# Patient Record
Sex: Female | Born: 1955 | Race: Black or African American | Hispanic: No | Marital: Married | Smoking: Never smoker
Health system: Southern US, Community
[De-identification: ages and names within clinical notes are randomized; demographics above are authoritative.]

## PROBLEM LIST (undated history)

## (undated) DIAGNOSIS — R3989 Other symptoms and signs involving the genitourinary system: Secondary | ICD-10-CM

## (undated) DIAGNOSIS — M542 Cervicalgia: Secondary | ICD-10-CM

## (undated) DIAGNOSIS — R351 Nocturia: Secondary | ICD-10-CM

## (undated) DIAGNOSIS — R0602 Shortness of breath: Secondary | ICD-10-CM

## (undated) DIAGNOSIS — M1990 Unspecified osteoarthritis, unspecified site: Secondary | ICD-10-CM

## (undated) DIAGNOSIS — T7840XA Allergy, unspecified, initial encounter: Secondary | ICD-10-CM

## (undated) DIAGNOSIS — K219 Gastro-esophageal reflux disease without esophagitis: Secondary | ICD-10-CM

## (undated) DIAGNOSIS — M810 Age-related osteoporosis without current pathological fracture: Secondary | ICD-10-CM

## (undated) DIAGNOSIS — Z8719 Personal history of other diseases of the digestive system: Secondary | ICD-10-CM

## (undated) DIAGNOSIS — R350 Frequency of micturition: Secondary | ICD-10-CM

## (undated) DIAGNOSIS — R3915 Urgency of urination: Secondary | ICD-10-CM

## (undated) DIAGNOSIS — E785 Hyperlipidemia, unspecified: Secondary | ICD-10-CM

## (undated) DIAGNOSIS — G4730 Sleep apnea, unspecified: Secondary | ICD-10-CM

## (undated) DIAGNOSIS — N3010 Interstitial cystitis (chronic) without hematuria: Secondary | ICD-10-CM

## (undated) DIAGNOSIS — G8929 Other chronic pain: Secondary | ICD-10-CM

## (undated) DIAGNOSIS — J45909 Unspecified asthma, uncomplicated: Secondary | ICD-10-CM

## (undated) HISTORY — DX: Allergy, unspecified, initial encounter: T78.40XA

## (undated) HISTORY — PX: OTHER SURGICAL HISTORY: SHX169

## (undated) HISTORY — DX: Hyperlipidemia, unspecified: E78.5

## (undated) HISTORY — PX: ABDOMINAL HYSTERECTOMY: SHX81

## (undated) HISTORY — DX: Age-related osteoporosis without current pathological fracture: M81.0

## (undated) HISTORY — DX: Unspecified osteoarthritis, unspecified site: M19.90

## (undated) HISTORY — DX: Sleep apnea, unspecified: G47.30

## (undated) HISTORY — PX: APPENDECTOMY: SHX54

## (undated) HISTORY — DX: Essential (primary) hypertension: I10

---

## 1996-02-01 HISTORY — PX: CHOLECYSTECTOMY: SHX55

## 1999-12-02 ENCOUNTER — Encounter (INDEPENDENT_AMBULATORY_CARE_PROVIDER_SITE_OTHER): Payer: Self-pay | Admitting: Specialist

## 1999-12-02 ENCOUNTER — Ambulatory Visit (HOSPITAL_COMMUNITY): Admission: RE | Admit: 1999-12-02 | Discharge: 1999-12-02 | Payer: Self-pay | Admitting: Urology

## 2000-07-30 ENCOUNTER — Ambulatory Visit (HOSPITAL_COMMUNITY): Admission: RE | Admit: 2000-07-30 | Discharge: 2000-07-30 | Payer: Self-pay | Admitting: Family Medicine

## 2000-07-30 ENCOUNTER — Encounter: Payer: Self-pay | Admitting: Family Medicine

## 2000-08-07 ENCOUNTER — Encounter: Payer: Self-pay | Admitting: Family Medicine

## 2000-08-07 ENCOUNTER — Ambulatory Visit (HOSPITAL_COMMUNITY): Admission: RE | Admit: 2000-08-07 | Discharge: 2000-08-07 | Payer: Self-pay | Admitting: Family Medicine

## 2000-11-20 ENCOUNTER — Encounter: Admission: RE | Admit: 2000-11-20 | Discharge: 2000-11-20 | Payer: Self-pay | Admitting: Urology

## 2000-11-20 ENCOUNTER — Encounter: Payer: Self-pay | Admitting: Urology

## 2000-11-27 ENCOUNTER — Other Ambulatory Visit: Admission: RE | Admit: 2000-11-27 | Discharge: 2000-11-27 | Payer: Self-pay | Admitting: Family Medicine

## 2000-12-07 ENCOUNTER — Encounter: Payer: Self-pay | Admitting: Family Medicine

## 2000-12-07 ENCOUNTER — Ambulatory Visit (HOSPITAL_COMMUNITY): Admission: RE | Admit: 2000-12-07 | Discharge: 2000-12-07 | Payer: Self-pay | Admitting: Family Medicine

## 2001-05-07 ENCOUNTER — Ambulatory Visit (HOSPITAL_COMMUNITY): Admission: RE | Admit: 2001-05-07 | Discharge: 2001-05-07 | Payer: Self-pay | Admitting: Family Medicine

## 2001-05-07 ENCOUNTER — Encounter: Payer: Self-pay | Admitting: Family Medicine

## 2002-05-09 ENCOUNTER — Encounter: Payer: Self-pay | Admitting: Podiatry

## 2002-05-13 ENCOUNTER — Ambulatory Visit (HOSPITAL_COMMUNITY): Admission: RE | Admit: 2002-05-13 | Discharge: 2002-05-13 | Payer: Self-pay | Admitting: Podiatry

## 2002-06-14 ENCOUNTER — Encounter: Payer: Self-pay | Admitting: Family Medicine

## 2002-06-14 ENCOUNTER — Ambulatory Visit (HOSPITAL_COMMUNITY): Admission: RE | Admit: 2002-06-14 | Discharge: 2002-06-14 | Payer: Self-pay | Admitting: Family Medicine

## 2003-03-04 ENCOUNTER — Ambulatory Visit (HOSPITAL_COMMUNITY): Admission: RE | Admit: 2003-03-04 | Discharge: 2003-03-04 | Payer: Self-pay | Admitting: Family Medicine

## 2003-04-14 ENCOUNTER — Ambulatory Visit (HOSPITAL_COMMUNITY): Admission: RE | Admit: 2003-04-14 | Discharge: 2003-04-14 | Payer: Self-pay | Admitting: Podiatry

## 2003-04-14 HISTORY — PX: BUNIONECTOMY: SHX129

## 2003-06-30 ENCOUNTER — Ambulatory Visit (HOSPITAL_COMMUNITY): Admission: RE | Admit: 2003-06-30 | Discharge: 2003-06-30 | Payer: Self-pay | Admitting: Family Medicine

## 2004-01-19 ENCOUNTER — Ambulatory Visit: Payer: Self-pay | Admitting: Orthopedic Surgery

## 2004-03-12 ENCOUNTER — Ambulatory Visit (HOSPITAL_COMMUNITY): Admission: RE | Admit: 2004-03-12 | Discharge: 2004-03-12 | Payer: Self-pay | Admitting: Orthopedic Surgery

## 2004-03-31 ENCOUNTER — Encounter (HOSPITAL_COMMUNITY): Admission: RE | Admit: 2004-03-31 | Discharge: 2004-04-30 | Payer: Self-pay | Admitting: Orthopedic Surgery

## 2004-05-03 ENCOUNTER — Ambulatory Visit (HOSPITAL_COMMUNITY): Admission: RE | Admit: 2004-05-03 | Discharge: 2004-05-03 | Payer: Self-pay | Admitting: Podiatry

## 2004-05-03 HISTORY — PX: FOOT ARTHROPLASTY: SHX1657

## 2004-05-10 ENCOUNTER — Ambulatory Visit (HOSPITAL_COMMUNITY): Admission: RE | Admit: 2004-05-10 | Discharge: 2004-05-10 | Payer: Self-pay | Admitting: Orthopedic Surgery

## 2004-05-28 ENCOUNTER — Ambulatory Visit: Payer: Self-pay | Admitting: Family Medicine

## 2004-05-31 ENCOUNTER — Ambulatory Visit: Payer: Self-pay | Admitting: Family Medicine

## 2004-08-05 ENCOUNTER — Ambulatory Visit (HOSPITAL_COMMUNITY): Admission: RE | Admit: 2004-08-05 | Discharge: 2004-08-05 | Payer: Self-pay | Admitting: Family Medicine

## 2004-08-12 ENCOUNTER — Encounter: Admission: RE | Admit: 2004-08-12 | Discharge: 2004-08-12 | Payer: Self-pay | Admitting: Family Medicine

## 2004-09-24 ENCOUNTER — Ambulatory Visit: Payer: Self-pay | Admitting: Family Medicine

## 2004-10-11 ENCOUNTER — Ambulatory Visit (HOSPITAL_COMMUNITY): Admission: RE | Admit: 2004-10-11 | Discharge: 2004-10-11 | Payer: Self-pay | Admitting: Family Medicine

## 2004-10-13 ENCOUNTER — Ambulatory Visit (HOSPITAL_COMMUNITY): Admission: RE | Admit: 2004-10-13 | Discharge: 2004-10-13 | Payer: Self-pay | Admitting: *Deleted

## 2004-10-26 ENCOUNTER — Encounter (HOSPITAL_COMMUNITY): Admission: RE | Admit: 2004-10-26 | Discharge: 2004-10-26 | Payer: Self-pay | Admitting: *Deleted

## 2005-02-01 ENCOUNTER — Ambulatory Visit (HOSPITAL_COMMUNITY): Admission: RE | Admit: 2005-02-01 | Discharge: 2005-02-01 | Payer: Self-pay | Admitting: Family Medicine

## 2005-03-24 ENCOUNTER — Ambulatory Visit: Payer: Self-pay | Admitting: Pulmonary Disease

## 2005-03-28 ENCOUNTER — Ambulatory Visit: Payer: Self-pay | Admitting: Internal Medicine

## 2005-04-04 ENCOUNTER — Ambulatory Visit (HOSPITAL_COMMUNITY): Admission: RE | Admit: 2005-04-04 | Discharge: 2005-04-04 | Payer: Self-pay | Admitting: Internal Medicine

## 2005-04-04 ENCOUNTER — Ambulatory Visit: Payer: Self-pay | Admitting: Internal Medicine

## 2005-04-04 ENCOUNTER — Encounter (INDEPENDENT_AMBULATORY_CARE_PROVIDER_SITE_OTHER): Payer: Self-pay | Admitting: Internal Medicine

## 2005-04-19 ENCOUNTER — Ambulatory Visit: Payer: Self-pay | Admitting: Internal Medicine

## 2005-08-12 ENCOUNTER — Encounter: Admission: RE | Admit: 2005-08-12 | Discharge: 2005-08-12 | Payer: Self-pay | Admitting: Family Medicine

## 2005-08-29 ENCOUNTER — Other Ambulatory Visit: Admission: RE | Admit: 2005-08-29 | Discharge: 2005-08-29 | Payer: Self-pay | Admitting: Family Medicine

## 2005-08-29 ENCOUNTER — Ambulatory Visit: Payer: Self-pay | Admitting: Family Medicine

## 2005-08-29 ENCOUNTER — Encounter: Payer: Self-pay | Admitting: Family Medicine

## 2005-12-05 ENCOUNTER — Ambulatory Visit: Payer: Self-pay | Admitting: Family Medicine

## 2006-03-17 ENCOUNTER — Ambulatory Visit: Payer: Self-pay | Admitting: Family Medicine

## 2006-03-31 ENCOUNTER — Encounter: Payer: Self-pay | Admitting: Family Medicine

## 2006-03-31 LAB — CONVERTED CEMR LAB
ALT: 15 units/L (ref 0–35)
Albumin: 4.5 g/dL (ref 3.5–5.2)
Bilirubin, Direct: 0.1 mg/dL (ref 0.0–0.3)
Cholesterol: 174 mg/dL (ref 0–200)
Eosinophils Relative: 1 % (ref 0–5)
HCT: 39.4 % (ref 36.0–46.0)
HDL: 58 mg/dL (ref >39)
Hemoglobin: 12.1 g/dL (ref 12.0–15.0)
Lymphocytes Relative: 48 % — ABNORMAL HIGH (ref 12–46)
Lymphs Abs: 2.7 10*3/uL (ref 0.7–3.3)
Monocytes Absolute: 0.4 10*3/uL (ref 0.2–0.7)
Total Bilirubin: 0.4 mg/dL (ref 0.3–1.2)
Total CHOL/HDL Ratio: 3
VLDL: 7 mg/dL (ref 0–40)
WBC: 5.6 10*3/uL (ref 4.0–10.5)

## 2006-04-18 ENCOUNTER — Ambulatory Visit: Payer: Self-pay | Admitting: Family Medicine

## 2006-05-03 ENCOUNTER — Ambulatory Visit: Payer: Self-pay | Admitting: Gastroenterology

## 2006-05-08 ENCOUNTER — Ambulatory Visit (HOSPITAL_COMMUNITY): Admission: RE | Admit: 2006-05-08 | Discharge: 2006-05-08 | Payer: Self-pay | Admitting: Internal Medicine

## 2006-05-29 ENCOUNTER — Ambulatory Visit: Payer: Self-pay | Admitting: Family Medicine

## 2006-06-05 ENCOUNTER — Ambulatory Visit (HOSPITAL_COMMUNITY): Admission: RE | Admit: 2006-06-05 | Discharge: 2006-06-05 | Payer: Self-pay | Admitting: Family Medicine

## 2006-08-14 ENCOUNTER — Encounter: Admission: RE | Admit: 2006-08-14 | Discharge: 2006-08-14 | Payer: Self-pay | Admitting: Family Medicine

## 2006-08-30 ENCOUNTER — Encounter: Payer: Self-pay | Admitting: Family Medicine

## 2006-08-30 ENCOUNTER — Ambulatory Visit: Payer: Self-pay | Admitting: Family Medicine

## 2006-08-30 ENCOUNTER — Other Ambulatory Visit: Admission: RE | Admit: 2006-08-30 | Discharge: 2006-08-30 | Payer: Self-pay | Admitting: Family Medicine

## 2006-08-30 LAB — CONVERTED CEMR LAB: Pap Smear: NORMAL

## 2006-09-12 ENCOUNTER — Encounter: Payer: Self-pay | Admitting: Family Medicine

## 2006-09-12 LAB — CONVERTED CEMR LAB
BUN: 13 mg/dL (ref 6–23)
CO2: 25 meq/L (ref 19–32)
Chloride: 106 meq/L (ref 96–112)
Eosinophils Absolute: 0.1 10*3/uL (ref 0.0–0.7)
Glucose, Bld: 87 mg/dL (ref 70–99)
LDL Cholesterol: 107 mg/dL — ABNORMAL HIGH (ref 0–99)
Lymphocytes Relative: 48 % — ABNORMAL HIGH (ref 12–46)
Lymphs Abs: 3.3 10*3/uL (ref 0.7–3.3)
MCV: 87 fL (ref 78.0–100.0)
Monocytes Relative: 8 % (ref 3–11)
Neutrophils Relative %: 43 % (ref 43–77)
Potassium: 4.2 meq/L (ref 3.5–5.3)
RBC: 4.4 M/uL (ref 3.87–5.11)
TSH: 2.237 microintl units/mL (ref 0.350–5.50)
Total CHOL/HDL Ratio: 3.1
VLDL: 11 mg/dL (ref 0–40)
WBC: 6.8 10*3/uL (ref 4.0–10.5)

## 2006-09-18 ENCOUNTER — Ambulatory Visit: Payer: Self-pay | Admitting: Family Medicine

## 2006-09-18 LAB — CONVERTED CEMR LAB
Albumin: 4.3 g/dL (ref 3.5–5.2)
Bilirubin, Direct: <0.1 mg/dL (ref 0.0–0.3)
Total Bilirubin: 0.3 mg/dL (ref 0.3–1.2)

## 2007-01-06 ENCOUNTER — Encounter: Payer: Self-pay | Admitting: Pulmonary Disease

## 2007-03-29 ENCOUNTER — Encounter: Payer: Self-pay | Admitting: Family Medicine

## 2007-08-09 ENCOUNTER — Ambulatory Visit: Payer: Self-pay | Admitting: Family Medicine

## 2007-08-16 ENCOUNTER — Encounter: Payer: Self-pay | Admitting: Family Medicine

## 2007-08-16 DIAGNOSIS — E785 Hyperlipidemia, unspecified: Secondary | ICD-10-CM | POA: Insufficient documentation

## 2007-08-16 DIAGNOSIS — N3090 Cystitis, unspecified without hematuria: Secondary | ICD-10-CM | POA: Insufficient documentation

## 2007-08-21 ENCOUNTER — Encounter: Admission: RE | Admit: 2007-08-21 | Discharge: 2007-08-21 | Payer: Self-pay | Admitting: Family Medicine

## 2007-08-23 ENCOUNTER — Encounter: Payer: Self-pay | Admitting: Family Medicine

## 2007-08-23 LAB — CONVERTED CEMR LAB
Basophils Absolute: 0 10*3/uL (ref 0.0–0.1)
CO2: 24 meq/L (ref 19–32)
Calcium: 8.9 mg/dL (ref 8.4–10.5)
Chloride: 105 meq/L (ref 96–112)
Creatinine, Ser: 0.83 mg/dL (ref 0.40–1.20)
Eosinophils Relative: 2 % (ref 0–5)
Glucose, Bld: 91 mg/dL (ref 70–99)
HCT: 37.7 % (ref 36.0–46.0)
HDL: 53 mg/dL (ref >39)
Hemoglobin: 11.5 g/dL — ABNORMAL LOW (ref 12.0–15.0)
LDL Cholesterol: 129 mg/dL — ABNORMAL HIGH (ref 0–99)
Lymphocytes Relative: 52 % — ABNORMAL HIGH (ref 12–46)
Lymphs Abs: 3.1 10*3/uL (ref 0.7–4.0)
Monocytes Absolute: 0.5 10*3/uL (ref 0.1–1.0)
Neutro Abs: 2.3 10*3/uL (ref 1.7–7.7)
Sodium: 141 meq/L (ref 135–145)
TSH: 2.816 microintl units/mL (ref 0.350–4.50)
Total CHOL/HDL Ratio: 3.7
WBC: 6 10*3/uL (ref 4.0–10.5)

## 2007-09-13 ENCOUNTER — Ambulatory Visit: Payer: Self-pay | Admitting: Family Medicine

## 2007-09-13 ENCOUNTER — Encounter: Payer: Self-pay | Admitting: Family Medicine

## 2007-09-13 ENCOUNTER — Other Ambulatory Visit: Admission: RE | Admit: 2007-09-13 | Discharge: 2007-09-13 | Payer: Self-pay | Admitting: Family Medicine

## 2007-09-13 DIAGNOSIS — M25559 Pain in unspecified hip: Secondary | ICD-10-CM

## 2007-09-13 LAB — CONVERTED CEMR LAB: Retic Ct Pct: 1.1 % (ref 0.4–3.1)

## 2007-09-14 ENCOUNTER — Encounter: Payer: Self-pay | Admitting: Family Medicine

## 2007-09-14 LAB — CONVERTED CEMR LAB: Gardnerella vaginalis: NEGATIVE

## 2007-09-16 ENCOUNTER — Telehealth: Payer: Self-pay | Admitting: Family Medicine

## 2007-09-16 DIAGNOSIS — G4700 Insomnia, unspecified: Secondary | ICD-10-CM | POA: Insufficient documentation

## 2007-09-17 ENCOUNTER — Telehealth: Payer: Self-pay | Admitting: Family Medicine

## 2007-09-17 ENCOUNTER — Ambulatory Visit (HOSPITAL_COMMUNITY): Admission: RE | Admit: 2007-09-17 | Discharge: 2007-09-17 | Payer: Self-pay | Admitting: Family Medicine

## 2007-09-26 ENCOUNTER — Telehealth: Payer: Self-pay | Admitting: Family Medicine

## 2007-09-27 DIAGNOSIS — M549 Dorsalgia, unspecified: Secondary | ICD-10-CM | POA: Insufficient documentation

## 2007-10-09 ENCOUNTER — Ambulatory Visit (HOSPITAL_COMMUNITY): Admission: RE | Admit: 2007-10-09 | Discharge: 2007-10-09 | Payer: Self-pay | Admitting: Family Medicine

## 2007-10-14 ENCOUNTER — Telehealth: Payer: Self-pay | Admitting: Family Medicine

## 2007-10-22 DIAGNOSIS — R079 Chest pain, unspecified: Secondary | ICD-10-CM

## 2007-10-23 ENCOUNTER — Ambulatory Visit (HOSPITAL_COMMUNITY): Admission: RE | Admit: 2007-10-23 | Discharge: 2007-10-23 | Payer: Self-pay | Admitting: Family Medicine

## 2007-10-30 ENCOUNTER — Encounter: Payer: Self-pay | Admitting: Family Medicine

## 2007-10-30 ENCOUNTER — Telehealth: Payer: Self-pay | Admitting: Family Medicine

## 2007-11-29 ENCOUNTER — Ambulatory Visit: Payer: Self-pay | Admitting: Family Medicine

## 2007-11-29 ENCOUNTER — Ambulatory Visit (HOSPITAL_COMMUNITY): Admission: RE | Admit: 2007-11-29 | Discharge: 2007-11-29 | Payer: Self-pay | Admitting: Family Medicine

## 2007-11-29 DIAGNOSIS — J329 Chronic sinusitis, unspecified: Secondary | ICD-10-CM

## 2007-11-29 DIAGNOSIS — J209 Acute bronchitis, unspecified: Secondary | ICD-10-CM

## 2007-11-29 DIAGNOSIS — J042 Acute laryngotracheitis: Secondary | ICD-10-CM | POA: Insufficient documentation

## 2007-11-29 LAB — CONVERTED CEMR LAB
Basophils Relative: 1 % (ref 0–1)
Eosinophils Absolute: 0.1 10*3/uL (ref 0.0–0.7)
Eosinophils Relative: 2 % (ref 0–5)
HCT: 38.1 % (ref 36.0–46.0)
Lymphs Abs: 2.7 10*3/uL (ref 0.7–4.0)
MCHC: 31.8 g/dL (ref 30.0–36.0)
MCV: 84.3 fL (ref 78.0–100.0)
Monocytes Absolute: 0.5 10*3/uL (ref 0.1–1.0)
Monocytes Relative: 8 % (ref 3–12)
RBC: 4.52 M/uL (ref 3.87–5.11)
WBC: 5.7 10*3/uL (ref 4.0–10.5)

## 2007-12-06 ENCOUNTER — Telehealth: Payer: Self-pay | Admitting: Family Medicine

## 2008-01-11 ENCOUNTER — Ambulatory Visit: Payer: Self-pay | Admitting: Family Medicine

## 2008-01-11 DIAGNOSIS — J45991 Cough variant asthma: Secondary | ICD-10-CM

## 2008-01-11 DIAGNOSIS — Z9109 Other allergy status, other than to drugs and biological substances: Secondary | ICD-10-CM

## 2008-01-11 DIAGNOSIS — K219 Gastro-esophageal reflux disease without esophagitis: Secondary | ICD-10-CM | POA: Insufficient documentation

## 2008-01-11 LAB — CONVERTED CEMR LAB
Alkaline Phosphatase: 107 units/L (ref 39–117)
Bilirubin, Direct: 0.1 mg/dL (ref 0.0–0.3)
Indirect Bilirubin: 0.3 mg/dL (ref 0.0–0.9)
LDL Cholesterol: 108 mg/dL — ABNORMAL HIGH (ref 0–99)
Total Bilirubin: 0.4 mg/dL (ref 0.3–1.2)
Total Protein: 7.3 g/dL (ref 6.0–8.3)
VLDL: 8 mg/dL (ref 0–40)

## 2008-01-14 ENCOUNTER — Telehealth: Payer: Self-pay | Admitting: Family Medicine

## 2008-02-13 ENCOUNTER — Ambulatory Visit: Payer: Self-pay | Admitting: Gastroenterology

## 2008-03-10 ENCOUNTER — Ambulatory Visit: Payer: Self-pay | Admitting: Gastroenterology

## 2008-03-10 ENCOUNTER — Ambulatory Visit (HOSPITAL_COMMUNITY): Admission: RE | Admit: 2008-03-10 | Discharge: 2008-03-10 | Payer: Self-pay | Admitting: Gastroenterology

## 2008-03-25 ENCOUNTER — Telehealth: Payer: Self-pay | Admitting: Family Medicine

## 2008-04-10 ENCOUNTER — Telehealth: Payer: Self-pay | Admitting: Family Medicine

## 2008-06-19 ENCOUNTER — Ambulatory Visit: Payer: Self-pay | Admitting: Family Medicine

## 2008-06-19 DIAGNOSIS — N3000 Acute cystitis without hematuria: Secondary | ICD-10-CM

## 2008-06-19 LAB — CONVERTED CEMR LAB
Bilirubin Urine: NEGATIVE
Ketones, urine, test strip: NEGATIVE
Nitrite: POSITIVE
Protein, U semiquant: NEGATIVE
pH: 6

## 2008-06-20 ENCOUNTER — Encounter: Payer: Self-pay | Admitting: Family Medicine

## 2008-06-23 ENCOUNTER — Encounter (INDEPENDENT_AMBULATORY_CARE_PROVIDER_SITE_OTHER): Payer: Self-pay

## 2008-06-23 ENCOUNTER — Telehealth: Payer: Self-pay | Admitting: Family Medicine

## 2008-08-28 ENCOUNTER — Ambulatory Visit: Payer: Self-pay | Admitting: Family Medicine

## 2008-08-28 DIAGNOSIS — G4730 Sleep apnea, unspecified: Secondary | ICD-10-CM | POA: Insufficient documentation

## 2008-09-01 ENCOUNTER — Encounter: Admission: RE | Admit: 2008-09-01 | Discharge: 2008-09-01 | Payer: Self-pay | Admitting: Family Medicine

## 2008-09-02 ENCOUNTER — Encounter: Payer: Self-pay | Admitting: Family Medicine

## 2008-09-03 ENCOUNTER — Telehealth: Payer: Self-pay | Admitting: Family Medicine

## 2008-09-04 ENCOUNTER — Encounter: Payer: Self-pay | Admitting: Family Medicine

## 2008-09-04 LAB — CONVERTED CEMR LAB: Retic Ct Pct: 0.8 % (ref 0.4–3.1)

## 2008-09-05 ENCOUNTER — Telehealth: Payer: Self-pay | Admitting: Family Medicine

## 2008-09-05 LAB — CONVERTED CEMR LAB
ALT: 21 units/L (ref 0–35)
AST: 16 units/L (ref 0–37)
Alkaline Phosphatase: 115 units/L (ref 39–117)
BUN: 11 mg/dL (ref 6–23)
Basophils Absolute: 0 10*3/uL (ref 0.0–0.1)
Basophils Relative: 0 % (ref 0–1)
Bilirubin, Direct: 0.1 mg/dL (ref 0.0–0.3)
Calcium: 8.8 mg/dL (ref 8.4–10.5)
Cholesterol: 173 mg/dL (ref 0–200)
Creatinine, Ser: 0.76 mg/dL (ref 0.40–1.20)
Eosinophils Relative: 2 % (ref 0–5)
Glucose, Bld: 101 mg/dL — ABNORMAL HIGH (ref 70–99)
HCT: 36.3 % (ref 36.0–46.0)
Indirect Bilirubin: 0.2 mg/dL (ref 0.0–0.9)
Lymphocytes Relative: 49 % — ABNORMAL HIGH (ref 12–46)
MCHC: 31.4 g/dL (ref 30.0–36.0)
Platelets: 248 10*3/uL (ref 150–400)
Potassium: 4.1 meq/L (ref 3.5–5.3)
RDW: 13.7 % (ref 11.5–15.5)
Total Protein: 6.7 g/dL (ref 6.0–8.3)
Triglycerides: 51 mg/dL (ref <150)
VLDL: 10 mg/dL (ref 0–40)

## 2008-09-09 ENCOUNTER — Telehealth: Payer: Self-pay | Admitting: Family Medicine

## 2008-09-12 ENCOUNTER — Encounter: Payer: Self-pay | Admitting: Family Medicine

## 2008-09-29 ENCOUNTER — Encounter: Payer: Self-pay | Admitting: Family Medicine

## 2008-10-05 ENCOUNTER — Encounter: Payer: Self-pay | Admitting: Family Medicine

## 2008-10-05 ENCOUNTER — Ambulatory Visit: Admission: RE | Admit: 2008-10-05 | Discharge: 2008-10-05 | Payer: Self-pay | Admitting: Emergency Medicine

## 2008-11-17 ENCOUNTER — Ambulatory Visit (HOSPITAL_BASED_OUTPATIENT_CLINIC_OR_DEPARTMENT_OTHER): Admission: RE | Admit: 2008-11-17 | Discharge: 2008-11-17 | Payer: Self-pay | Admitting: Urology

## 2008-12-08 ENCOUNTER — Encounter: Payer: Self-pay | Admitting: Family Medicine

## 2009-02-11 ENCOUNTER — Telehealth: Payer: Self-pay | Admitting: Family Medicine

## 2009-04-15 ENCOUNTER — Encounter: Payer: Self-pay | Admitting: Family Medicine

## 2009-04-21 ENCOUNTER — Encounter: Payer: Self-pay | Admitting: Family Medicine

## 2009-04-22 ENCOUNTER — Encounter: Payer: Self-pay | Admitting: Family Medicine

## 2009-05-27 ENCOUNTER — Encounter: Payer: Self-pay | Admitting: Family Medicine

## 2009-06-01 ENCOUNTER — Ambulatory Visit: Payer: Self-pay | Admitting: Family Medicine

## 2009-06-02 ENCOUNTER — Encounter: Payer: Self-pay | Admitting: Family Medicine

## 2009-06-02 DIAGNOSIS — R110 Nausea: Secondary | ICD-10-CM

## 2009-06-03 LAB — CONVERTED CEMR LAB: Retic Ct Pct: 1 % (ref 0.4–3.1)

## 2009-06-19 ENCOUNTER — Encounter: Payer: Self-pay | Admitting: Gastroenterology

## 2009-06-19 ENCOUNTER — Ambulatory Visit: Payer: Self-pay | Admitting: Internal Medicine

## 2009-06-19 DIAGNOSIS — R10816 Epigastric abdominal tenderness: Secondary | ICD-10-CM

## 2009-06-24 ENCOUNTER — Telehealth: Payer: Self-pay | Admitting: Family Medicine

## 2009-07-20 ENCOUNTER — Ambulatory Visit: Payer: Self-pay | Admitting: Gastroenterology

## 2009-07-20 ENCOUNTER — Ambulatory Visit (HOSPITAL_COMMUNITY): Admission: RE | Admit: 2009-07-20 | Discharge: 2009-07-20 | Payer: Self-pay | Admitting: Gastroenterology

## 2009-08-17 ENCOUNTER — Ambulatory Visit: Payer: Self-pay | Admitting: Orthopedic Surgery

## 2009-08-17 ENCOUNTER — Other Ambulatory Visit: Admission: RE | Admit: 2009-08-17 | Discharge: 2009-08-17 | Payer: Self-pay | Admitting: Family Medicine

## 2009-08-17 ENCOUNTER — Ambulatory Visit: Payer: Self-pay | Admitting: Family Medicine

## 2009-08-17 DIAGNOSIS — R5383 Other fatigue: Secondary | ICD-10-CM

## 2009-08-17 DIAGNOSIS — S63639A Sprain of interphalangeal joint of unspecified finger, initial encounter: Secondary | ICD-10-CM

## 2009-08-17 DIAGNOSIS — R05 Cough: Secondary | ICD-10-CM

## 2009-08-17 DIAGNOSIS — R059 Cough, unspecified: Secondary | ICD-10-CM | POA: Insufficient documentation

## 2009-08-17 DIAGNOSIS — M79609 Pain in unspecified limb: Secondary | ICD-10-CM

## 2009-08-17 DIAGNOSIS — R5381 Other malaise: Secondary | ICD-10-CM | POA: Insufficient documentation

## 2009-08-17 LAB — CONVERTED CEMR LAB: OCCULT 1: NEGATIVE

## 2009-08-21 ENCOUNTER — Encounter: Payer: Self-pay | Admitting: Family Medicine

## 2009-08-21 LAB — CONVERTED CEMR LAB: Pap Smear: NEGATIVE

## 2009-08-24 ENCOUNTER — Encounter: Payer: Self-pay | Admitting: Gastroenterology

## 2009-08-31 ENCOUNTER — Ambulatory Visit: Payer: Self-pay | Admitting: Orthopedic Surgery

## 2009-08-31 ENCOUNTER — Ambulatory Visit: Payer: Self-pay | Admitting: Internal Medicine

## 2009-09-07 ENCOUNTER — Ambulatory Visit: Payer: Self-pay | Admitting: Orthopedic Surgery

## 2009-09-14 ENCOUNTER — Telehealth: Payer: Self-pay | Admitting: Family Medicine

## 2009-09-15 ENCOUNTER — Encounter (HOSPITAL_COMMUNITY): Admission: RE | Admit: 2009-09-15 | Discharge: 2009-10-15 | Payer: Self-pay | Admitting: Orthopedic Surgery

## 2009-09-15 ENCOUNTER — Telehealth: Payer: Self-pay | Admitting: Family Medicine

## 2009-09-22 ENCOUNTER — Telehealth: Payer: Self-pay | Admitting: Family Medicine

## 2009-09-23 ENCOUNTER — Encounter: Payer: Self-pay | Admitting: Family Medicine

## 2009-09-23 ENCOUNTER — Telehealth: Payer: Self-pay | Admitting: Family Medicine

## 2009-09-23 ENCOUNTER — Encounter: Payer: Self-pay | Admitting: Orthopedic Surgery

## 2009-09-24 ENCOUNTER — Telehealth: Payer: Self-pay | Admitting: Family Medicine

## 2009-09-29 ENCOUNTER — Telehealth (INDEPENDENT_AMBULATORY_CARE_PROVIDER_SITE_OTHER): Payer: Self-pay | Admitting: *Deleted

## 2009-10-06 ENCOUNTER — Encounter: Payer: Self-pay | Admitting: Orthopedic Surgery

## 2009-10-07 ENCOUNTER — Encounter: Payer: Self-pay | Admitting: Family Medicine

## 2009-10-08 ENCOUNTER — Telehealth: Payer: Self-pay | Admitting: Orthopedic Surgery

## 2009-10-08 ENCOUNTER — Telehealth: Payer: Self-pay | Admitting: Family Medicine

## 2009-10-08 ENCOUNTER — Encounter: Payer: Self-pay | Admitting: Family Medicine

## 2009-10-08 DIAGNOSIS — R7303 Prediabetes: Secondary | ICD-10-CM | POA: Insufficient documentation

## 2009-10-08 LAB — CONVERTED CEMR LAB: Retic Ct Pct: 1.2 % (ref 0.4–3.1)

## 2009-10-09 LAB — CONVERTED CEMR LAB
ALT: 23 units/L (ref 0–35)
BUN: 9 mg/dL (ref 6–23)
Chloride: 105 meq/L (ref 96–112)
Cholesterol: 216 mg/dL — ABNORMAL HIGH (ref 0–200)
Eosinophils Absolute: 0.1 10*3/uL (ref 0.0–0.7)
Glucose, Bld: 92 mg/dL (ref 70–99)
Hgb A1c MFr Bld: 6.3 % — ABNORMAL HIGH (ref <5.7)
Indirect Bilirubin: 0.3 mg/dL (ref 0.0–0.9)
LDL Cholesterol: 149 mg/dL — ABNORMAL HIGH (ref 0–99)
Lymphs Abs: 2.3 10*3/uL (ref 0.7–4.0)
Monocytes Relative: 8 % (ref 3–12)
Neutro Abs: 2.3 10*3/uL (ref 1.7–7.7)
Neutrophils Relative %: 45 % (ref 43–77)
Platelets: 298 10*3/uL (ref 150–400)
Potassium: 4.8 meq/L (ref 3.5–5.3)
RBC: 4.37 M/uL (ref 3.87–5.11)
Sodium: 140 meq/L (ref 135–145)
Total CHOL/HDL Ratio: 3.9
Total Protein: 6.8 g/dL (ref 6.0–8.3)
Triglycerides: 61 mg/dL (ref <150)
VLDL: 12 mg/dL (ref 0–40)
Vit D, 25-Hydroxy: 39 ng/mL (ref 30–89)
WBC: 5.1 10*3/uL (ref 4.0–10.5)

## 2009-10-12 ENCOUNTER — Telehealth: Payer: Self-pay | Admitting: Family Medicine

## 2009-10-13 ENCOUNTER — Encounter: Payer: Self-pay | Admitting: Family Medicine

## 2009-10-13 ENCOUNTER — Ambulatory Visit (HOSPITAL_BASED_OUTPATIENT_CLINIC_OR_DEPARTMENT_OTHER): Admission: RE | Admit: 2009-10-13 | Discharge: 2009-10-13 | Payer: Self-pay | Admitting: Orthopedic Surgery

## 2009-10-13 HISTORY — PX: FINGER SURGERY: SHX640

## 2009-10-19 ENCOUNTER — Encounter: Admission: RE | Admit: 2009-10-19 | Discharge: 2009-10-19 | Payer: Self-pay | Admitting: Family Medicine

## 2009-11-03 ENCOUNTER — Telehealth: Payer: Self-pay | Admitting: Internal Medicine

## 2009-11-03 ENCOUNTER — Telehealth (INDEPENDENT_AMBULATORY_CARE_PROVIDER_SITE_OTHER): Payer: Self-pay | Admitting: *Deleted

## 2009-11-17 ENCOUNTER — Encounter (INDEPENDENT_AMBULATORY_CARE_PROVIDER_SITE_OTHER): Payer: Self-pay | Admitting: *Deleted

## 2009-11-17 ENCOUNTER — Telehealth (INDEPENDENT_AMBULATORY_CARE_PROVIDER_SITE_OTHER): Payer: Self-pay | Admitting: *Deleted

## 2009-11-30 LAB — CONVERTED CEMR LAB
ALT: 26 units/L (ref 0–35)
AST: 25 units/L (ref 0–37)
Albumin: 4.2 g/dL (ref 3.5–5.2)
Alkaline Phosphatase: 108 units/L (ref 39–117)
BUN: 11 mg/dL (ref 6–23)
Calcium: 9.2 mg/dL (ref 8.4–10.5)
Cholesterol: 112 mg/dL (ref 0–200)
Creatinine, Ser: 0.85 mg/dL (ref 0.40–1.20)
HDL: 45 mg/dL (ref >39)
Indirect Bilirubin: 0.3 mg/dL (ref 0.0–0.9)
Total Protein: 6.6 g/dL (ref 6.0–8.3)
Triglycerides: 38 mg/dL (ref <150)

## 2010-01-07 ENCOUNTER — Ambulatory Visit: Payer: Self-pay | Admitting: Family Medicine

## 2010-01-19 ENCOUNTER — Telehealth: Payer: Self-pay | Admitting: Family Medicine

## 2010-02-10 ENCOUNTER — Encounter: Payer: Self-pay | Admitting: Family Medicine

## 2010-02-21 ENCOUNTER — Encounter: Payer: Self-pay | Admitting: Family Medicine

## 2010-02-22 ENCOUNTER — Encounter: Payer: Self-pay | Admitting: Family Medicine

## 2010-03-02 NOTE — Assessment & Plan Note (Signed)
Summary: 2 WK RE-CK IN SPLINT/BCBS/CAF   Referring Provider:  Dr. [name] Primary Provider:  [name]   History of Present Illness: I saw Denise Werner in the office today for a followup visit.  She is a 55 years old woman with the complaint of:   a small finger treated with splinting  08/16/09 DOI.  Meds in EMR, PCP gave Ibuprofen 800mg  and Steroid dose pak today.  NORCO 5-325 MG TABS (HYDROCODONE-ACETAMINOPHEN) 1 q 4 as needed pain  #42 x 1  Still complains of quite a bit of swelling, stiffness and pain     Allergies: 1)  ! Sulfa 2)  ! * Latex 3)  ! Codeine  Physical Exam  Extremities:  LEFT small finger significant amount of swelling is noted at the PIP joint with only about 10 of flexion she has full extension there is mild ulnar deviation at the PIP joint.     Impression & Recommendations:  Problem # 1:  SPRAIN AND STRAIN OF INTERPHALANGEAL OF HAND (UJW-119.14) Assessment Unchanged  previous x-rays show no fracture of the PIP joint of the finger  Recommend splinting with buddy taping him back one week if not improving in terms of swelling and range of motion recommend hand therapy  Orders: Est. Patient Level II (78295)  Patient Instructions: 1)  1 week check ROM

## 2010-03-02 NOTE — Letter (Signed)
Summary: History form  History form   Imported By: Jacklynn Ganong 08/18/2009 16:48:55  _____________________________________________________________________  External Attachment:    Type:   Image     Comment:   External Document

## 2010-03-02 NOTE — Progress Notes (Signed)
Summary: CALL BACK  Phone Note Call from Patient   Summary of Call: WANTS YOU TO CALL HER AT [phone] Initial call taken by: Lind Guest,  November 17, 2009 9:53 AM  Follow-up for Phone Call        pt was called but said that jamie had done taking care of lab orders for mother inlaw Follow-up by: [name],  November 17, 2009 2:14 PM

## 2010-03-02 NOTE — Progress Notes (Signed)
Summary: lab order  Phone Note Call from Patient   Summary of Call: fax over lab work to the lab she is going tomorrow Initial call taken by: Lind Guest,  September 15, 2009 1:10 PM  Follow-up for Phone Call         fasting chem 7 lipid hepatichBA1C andvit D, cbc Follow-up by: [name] MD,  September 15, 2009 4:59 PM  Additional Follow-up for Phone Call Additional follow up Details #1::        lab ordered faxed Additional Follow-up by: Adella Hare LPN,  September 15, 2009 5:03 PM

## 2010-03-02 NOTE — Miscellaneous (Signed)
Summary: OT Clinical evaluation  OT Clinical evaluation   Imported By: Jacklynn Ganong 10/09/2009 09:33:23  _____________________________________________________________________  External Attachment:    Type:   Image     Comment:   External Document

## 2010-03-02 NOTE — Progress Notes (Signed)
Summary: speak with doc  Phone Note Call from Patient   Summary of Call: Dr. [name], Denise Werner called and stated graming can't see her until oct . Wanted to know if there was anything you could do to get her in faster? Are could you order MRI? [phone] Initial call taken by: [name],  September 23, 2009 10:21 AM  Follow-up for Phone Call        plI will hand write a note an ask you to fax up there to doc when you call for sooner appt hopefully this will help, let herknow i am trying Follow-up by: [name] MD,  September 23, 2009 1:28 PM  Additional Follow-up for Phone Call Additional follow up Details #1::        called and let pt know that dr. [name] wrote a note to dr. [name] to get her in sooner. pt notified Additional Follow-up by: [name],  September 23, 2009 2:34 PM

## 2010-03-02 NOTE — Miscellaneous (Signed)
  Clinical Lists Changes  Medications: Removed medication of CRESTOR 10 MG  TABS (ROSUVASTATIN CALCIUM) Take 1 tab by mouth at bedtime on mon, wed, fri Added new medication of CRESTOR 20 MG TABS (ROSUVASTATIN CALCIUM) Take 1 tab by mouth at bedtime - Signed Rx of CRESTOR 20 MG TABS (ROSUVASTATIN CALCIUM) Take 1 tab by mouth at bedtime;  #30 x 3;  Signed;  Entered by: [name] MD;  Authorized by: [name] MD;  Method used: Historical    Prescriptions: CRESTOR 20 MG TABS (ROSUVASTATIN CALCIUM) Take 1 tab by mouth at bedtime  #30 x 3   Entered and Authorized by:   [name] MD   Signed by:   [name] MD on 10/08/2009   Method used:   Historical   RxID:   [id number]   Appended Document:  pls sched appt for Cooper Schneider in 3 months and call her with the appt  Appended Document:  pt has appt for 01/07/2010 4:00. left message for pt to call office

## 2010-03-02 NOTE — Letter (Signed)
Summary: Letter  Letter   Imported By: [name] 10/29/2009 14:51:56  _____________________________________________________________________  External Attachment:    Type:   Image     Comment:   External Document

## 2010-03-02 NOTE — Assessment & Plan Note (Signed)
Summary: chronic nausea   Visit Type:  f/u Referring Provider:    Primary Care Provider:  [name]  Chief Complaint:  nausea.  History of Present Illness: [name] is a pleasant 55 y/o female who presents for further evaluation of chronic nausea. Symptoms began 3 months ago. She is nauseated all the time. She wakes up with it. Sometimes worse with meals. Doesn't seem to be related to certain foods. Typical heartburn well-controlled but some indigestion. Switched from Nexium to Aciphex but no improvement. No vomiting. No abd pain. BM regular. No blood in stool or melena. Takes about 3 BCs per month for headache. Taking lots of Emitrol. Tried phenergan but made too drowsy. Weight up 5 pounds. No new medications. Lots of trouble with interstitial cystitis. Remains very active.   Recent labs showed Hgb 11.6 but Hct normal. Iron normal. TSH normal. Vit D level low.  In 8/10, H.pylori serologies were normal. LFTs were normal.     Current Medications (verified): 1)  Elmiron 100 Mg  Caps (Pentosan Polysulfate Sodium) .... Take Two Caps By Mouth Two Times A Day 2)  Crestor 10 Mg  Tabs (Rosuvastatin Calcium) .... Take 1 Tab By Mouth At Bedtime On Mon, Wed, Fri 3)  Promethazine Hcl 12.5 Mg Supp (Promethazine Hcl) .... Take 1 Tablet By Mouth Two Times A Day As Needed 4)  Aciphex 20 Mg Tbec (Rabeprazole Sodium) .... Take 1 Tablet By Mouth Once A Day 5)  Vitamin D (Ergocalciferol) 50000 Unit Caps (Ergocalciferol) .... One Capsule Once Weekly 6)  Symbicort 80-4.5 Mcg/act Aero (Budesonide-Formoterol Fumarate) .... 2 Puffs Twice Daily 7)  Promethazine Hcl 12.5 Mg Tabs (Promethazine Hcl) .... Take 1 Tablet By Mouth Two Times A Day As Needed  Allergies (verified): 1)  ! Sulfa 2)  ! * Latex 3)  ! Codeine  Past History:  Past Medical History: HYPERLIPIDEMIA (ICD-272.4) CYSTITIS (ICD-595.9) Interstitial cystitis Asthma EGD, 3/07, Dr. Loran Senters antral gastritis and bulbar  duodenitis, multiple tiny polyps in gastric body, path - small fundic gland polyp and bengn fragments. H.Pylori serologies were negative. TCS 2001, Dr. Truitt Leep polyp at distal sigmoid colon (hyperplastic). TCS 03/2008, Dr. Darrick Penna --> Normal terminal ileum, approximately for 10 cm visualized. Rare sigmoid colon diverticula.  Otherwise normal colon without evidence of polyps, masses, inflammatory changes, or AVMs. Next TCS 03/2018.   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  Never Smoked Alcohol use-no Drug use-no  Review of Systems General:  Denies fever, chills, sweats, anorexia, weakness, and weight loss. Eyes:  Denies vision loss. ENT:  Denies nasal congestion, sore throat, hoarseness, and difficulty swallowing. CV:  Denies chest pains, angina, palpitations, dyspnea on exertion, and peripheral edema. Resp:  Denies dyspnea at rest, dyspnea with exercise, and cough. GI:  See HPI. GU:  Complains of urinary frequency; denies urinary burning. MS:  Denies joint pain / LOM. Derm:  Denies rash and itching. Neuro:  Denies weakness, frequent headaches, memory loss, and confusion. Psych:  Denies depression and anxiety. Endo:  Denies unusual weight change. Heme:  Denies bruising and bleeding. Allergy:  Denies hives and rash.  Vital Signs:  Patient profile:   55 year  old female Menstrual status:  hysterectomy Height:      66 inches Weight:      150 pounds BMI:     24.30 Temp:     97.8 degrees F oral Pulse rate:   68 / minute BP sitting:   132 / 90  (left arm) Cuff size:   regular  Vitals Entered By: Hendricks Limes LPN  (Jun 19, 2009 11:37 AM)  Physical Exam  General:  Well developed, well nourished, no acute distress. Head:  Normocephalic and atraumatic. Eyes:  Conjunctivae pink, no scleral icterus.  Mouth:  Oropharyngeal mucosa moist, pink.  No lesions, erythema or exudate.    Neck:  Supple; no masses or thyromegaly. Lungs:  Clear throughout to auscultation. Heart:  Regular rate and rhythm; no murmurs, rubs,  or bruits. Abdomen:  Positive BS. Mild epigastric tenderness. No rebound or guarding. No HSM or masses. No abd bruit or hernia. Extremities:  No clubbing, cyanosis, edema or deformities noted. Neurologic:  Alert and  oriented x4;  grossly normal neurologically. Skin:  Intact without significant lesions or rashes. Cervical Nodes:  No significant cervical adenopathy. Psych:  Alert and cooperative. Normal mood and affect.  Impression & Recommendations:  Problem # 1:  NAUSEA (ICD-787.02)  Nausea for over three months without much in way of other symptoms. She has had some increased belching. Mild epigastric tenderness on exam. Thyroid function okay. No recent change in medications. No depression. I discussed with her options of increasing PPI to see if this is atypical manifestation of refractory GERD. She is requesting EGD. This could potentially be low yield, but with h/o multiple gastric polyps and epigastric tenderness, will pursue EGD. EGD to be performed in near future.  Risks, alternatives, benefits including but not limited to risk of reaction to medications, bleeding, infection, and perforation addressed.  Patient voiced understanding and verbal consent obtained.   Orders: Est. Patient Level IV (16109)  Appended Document: chronic nausea  Past History:  Past Surgical History: Last updated: 08/16/2007 TAH and BSO 1993 Cholecystectomy 1999 Left Bunionectomy 05/2002 Removal of adhesions from abdomen and pelvis    Clinical Lists Changes

## 2010-03-02 NOTE — Assessment & Plan Note (Signed)
Summary: EVAL/TREAT FINGER PAIN/REF M.SIMPSON/BCBS/CAF   Vital Signs:  Patient profile:   56 year old female Menstrual status:  hysterectomy Height:      66 inches Weight:      149 pounds Pulse rate:   72 / minute Resp:     16 per minute  Visit Type:  new patient Referring Provider:  Dr. [name] Primary Provider:  [name]  CC:  left pinky finger pain.  History of Present Illness: I saw Denise Werner in the office today for an initial visit.  She is a 55 years old woman with the complaint of:  left pinky finger pain.  Xrays today in our office.  08/16/09 DOI.  Meds in EMR, PCP gave Ibuprofen 800mg  and Steroid dose pak today.  Patient reports falling on 17 July injured her LEFT small finger and now can't bend it.  6 Avapro from 20 mg no relief.  She is dull throbbing constant pain with bruising numbness and tingling improved only with ice.  Pain is constant and she rates it an 8/10.      Allergies: 1)  ! Sulfa 2)  ! * Latex 3)  ! 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  Never Smoked Alcohol use-no small amt of caffeine daily Drug use-no  Review of Systems Constitutional:  Denies weight loss, weight gain, fever, chills, and fatigue. Cardiovascular:  Denies chest pain, palpitations, fainting, and murmurs. Respiratory:  Complains of short of breath; denies wheezing, couch, tightness, pain on inspiration, and snoring . Gastrointestinal:  Complains of heartburn; denies nausea, vomiting, diarrhea, constipation, and blood in your stools. Genitourinary:  Denies frequency, urgency, difficulty urinating, painful urination, flank pain, and bleeding in urine. Neurologic:  Denies numbness, tingling, unsteady gait, dizziness, tremors, and seizure. Musculoskeletal:  Denies joint pain, swelling, instability, stiffness, redness, heat, and muscle pain. Endocrine:  Denies excessive thirst, exessive urination, and heat or cold  intolerance. Psychiatric:  Denies nervousness, depression, anxiety, and hallucinations. Skin:  Denies changes in the skin, poor healing, rash, itching, and redness. HEENT:  Complains of blurred or double vision; denies eye pain, redness, and watering. Immunology:  Denies seasonal allergies, sinus problems, and allergic to bee stings. Hemoatologic:  Denies easy bleeding and brusing.   Wrist/Hand Exam  General:    Well-developed, well-nourished, in no acute distress; alert and oriented x 3.    Skin:    Intact with no erythema; no scarring.    Inspection:    swelling of the LEFT small finger, LEFT ring finger no swelling  Palpation:    LEFT ring finger nontender, LEFT small finger tenderness at the PIP joint, grade 4 out of 4. at the MP joint and IP joint grade 1/4  Vascular:    Radial, ulnar, brachial, and axillary pulses 2+ and symmetric; capillary refill less than 2 seconds; no evidence of ischemia, clubbing, or cyanosis.    Sensory:    Gross sensation intact in the upper extremities.    Motor:    normal muscle tone in the LEFT upper extremity decreased flexion and extension at the PIP joint MP joint and PIP joint  Reflexes:    Normal reflexes in the upper extremities.    Hand Exam:    Right:    Inspection:  Normal    Palpation:  Normal    Tenderness:  no    Swelling:  no    Erythema:  no    Left:    Inspection:  Abnormal    Palpation:  Abnormal    Tenderness:  5th PIPJ    Swelling:  5th PIPJ    Erythema:  no   Impression & Recommendations:  Problem # 1:  SPRAIN AND STRAIN OF INTERPHALANGEAL OF HAND (NWG-956.21) Assessment New  LEFT hand x-ray no fracture dislocation same soft tissue swelling at the PIP joint of the LEFT small finger  Orders: New Patient Level II (30865) Hand x-ray, minimum 3 views (73130)  Medications Added to Medication List This Visit: 1)  Norco 5-325 Mg Tabs (Hydrocodone-acetaminophen) .Marland Kitchen.. 1 q 4 as needed pain  Patient  Instructions: 1)  Please schedule a follow-up appointment in 2 weeks. 2)  wear splint x 2 weeks  Prescriptions: NORCO 5-325 MG TABS (HYDROCODONE-ACETAMINOPHEN) 1 q 4 as needed pain  #42 x 1   Entered and Authorized by:   Fuller Canada MD   Signed by:   Fuller Canada MD on 08/17/2009   Method used:   Print then Give to Patient   RxID:   [id number]

## 2010-03-02 NOTE — Letter (Signed)
Summary: Letter  Letter   Imported By: Lind Guest 08/24/2009 12:59:45  _____________________________________________________________________  External Attachment:    Type:   Image     Comment:   External Document

## 2010-03-02 NOTE — Miscellaneous (Signed)
Summary: OT progress note  OT progress note   Imported By: Jacklynn Ganong 10/13/2009 13:33:29  _____________________________________________________________________  External Attachment:    Type:   Image     Comment:   External Document

## 2010-03-02 NOTE — Assessment & Plan Note (Signed)
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 [name] Primary Provider/Referring Provider:  [name]  CC:  Pulmonary consult for asthma. The patient c/o increased sob with exertion and when lying down. Worse for 4 months. .  History of Present Illness: 58 yobf never smoker dx'd with asthma 1995 and under the care of Dr Al Decant on shots in early 1990's but avoidance resolved symptoms  then new breathing problems winter / early spring of 2011 > allergery re-eval with ragweed, grasses, roaches and started back on shots around 05/2009.   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 Avg's 4 x daily saba, not consistent with ICS, even when on symbicort.  Pt denies any significant sore throat, dysphagia, itching, sneezing,  nasal congestion or excess secretions,  fever, chills, sweats, unintended wt loss, pleuritic or exertional cp, hempoptysis, change in activity tolerance  orthopnea pnd or leg swelling Pt also denies any obvious fluctuation in symptoms with weather or environmental change or other alleviating or aggravating factors other than worse hairdressing.     Preventive Screening-Counseling & Management  Alcohol-Tobacco     Alcohol drinks/day: 0     Smoking Status: never  Current Medications (verified): 1)  Elmiron 100 Mg  Caps (Pentosan Polysulfate Sodium) .... Take Two Caps By Mouth Two Times A Day 2)  Crestor 10 Mg  Tabs (Rosuvastatin Calcium) .... Take 1 Tab By Mouth At Bedtime On Mon, Wed, Fri 3)  Aciphex 20 Mg Tbec (Rabeprazole Sodium) .... Take 1 Tablet By Mouth Once A Day 4)  Vitamin D (Ergocalciferol) 50000 Unit Caps (Ergocalciferol) .... One Capsule Once Weekly 5)  Asmanex 120  Metered Doses 220 Mcg/inh Aepb (Mometasone Furoate) .... 2 Puffs Every 4-6 Hours As Needed 6)  Promethazine Hcl 12.5 Mg Tabs (Promethazine Hcl) .... Take 1 Tablet By Mouth Two Times A Day As Needed 7)  Ibuprofen 800 Mg Tabs (Ibuprofen) .... Take 1 Tablet By Mouth Three Times A Day As Needed 8)  Proventil Hfa 108 (90 Base) Mcg/act Aers (Albuterol Sulfate) .... 2 Puffs Up To 4 Times Daily As Needed  Allergies (verified): 1)  ! Sulfa 2)  ! * Latex 3)  ! Codeine  Past History:  Past Medical History: HYPERLIPIDEMIA (ICD-272.4) CYSTITIS (ICD-595.9) Interstitial cystitis Asthma     - Poor control of symptoms since 2009 EGD, 3/07, Dr. Loran Senters antral gastritis and bulbar duodenitis, multiple tiny polyps in gastric body, path - small fundic gland polyp and bengn fragments. H.Pylori serologies were negative. TCS 2001, Dr. Truitt Leep polyp at distal sigmoid colon (hyperplastic). TCS 03/2008, Dr. Darrick Penna --> Normal terminal ileum, approximately for 10 cm visualized. Rare sigmoid colon diverticula.  Otherwise normal colon without evidence of polyps, masses, inflammatory changes, or AVMs. Next TCS 03/2018.   Social History: Alcohol drinks/day:  0  Review of Systems       The patient complains of shortness of breath with activity, shortness of breath at rest, productive cough, and indigestion.  The patient denies non-productive cough, coughing up blood, chest pain, irregular heartbeats, acid heartburn, loss of appetite, weight change, abdominal pain, difficulty swallowing, sore throat, tooth/dental problems, headaches, nasal congestion/difficulty breathing through nose, sneezing, itching, ear ache, anxiety, depression, hand/feet swelling,  joint stiffness or pain, rash, change in color of mucus, and fever.    Vital Signs:  Patient profile:   55 year old female Menstrual status:  hysterectomy Height:      66 inches (167.64 cm) Weight:      153 pounds (69.55 kg) BMI:     24.78 O2 Sat:       98 % on Room air Temp:     98.0 degrees F (36.67 degrees C) oral Pulse rate:   77 / minute BP sitting:   120 / 72  (left arm) Cuff size:   regular  Vitals Entered By: Michel Bickers CMA (August 31, 2009 10:36 AM)  O2 Sat at Rest %:  98 O2 Flow:  Room air CC: Pulmonary consult for asthma. The patient c/o increased sob with exertion and when lying down. Worse for 4 months.  Comments Medications reviewed with the patient. Daytime phone number verified. Michel Bickers CMA  August 31, 2009 10:47 AM   Physical Exam  Additional Exam:  ey 149 > 153 August 31, 2009 pleasnt amb bf, mod ho[place]se, nad HEENT: nl dentition, turbinates, and orophanx. Nl external ear canals without cough reflex NECK :  without JVD/Nodes/TM/ nl carotid upstrokes bilaterally LUNGS: no acc muscle use, clear to A and P bilaterally without cough on insp or exp maneuvers CV:  RRR  no s3 or murmur or increase in P2, no edema  ABD:  soft and nontender with nl excursion in the supine position. No bruits or organomegaly, bowel sounds nl MS:  warm without deformities, calf tenderness, cyanosis or clubbing SKIN: warm and dry without lesions   NEURO:  alert, approp, no deficits     Impression & Recommendations:  Problem # 1:  COUGH (ICD-786.2) The most common causes of chronic cough in immunocompetent adults include: upper airway cough syndrome (UACS), previously referred to as postnasal drip syndrome,  caused by variety of rhinosinus conditions; (2) asthma; (3) GERD; (4) chronic bronchitis from cigarette smoking or other inhaled environmental irritants; (5) nonasthmatic eosinophilic bronchitis; and (6) bronchiectasis. These conditions, singly or in combination, have accounted for up to 94% of the causes of chronic cough in prospective studies.  Believe this is most c/w  Classic Upper airway cough syndrome, so named because it's frequently impossible to sort out how much is  CR/sinusitis with freq throat clearing (which can be  related to primary GERD)   vs  causing  secondary extra esophageal GERD from wide swings in gastric pressure that occur with throat clearing, promoting self use of mint and menthol lozenges that reduce the lower esophageal sphincter tone and exacerbate the problem further These are the same pts who not infrequently have failed to tolerate ace inhibitors,  dry powder inhalers or biphosphonates or report having reflux symptoms that don't respond to standard doses of PPI   rec See instructions for specific recommendations    Problem # 2:  COUGH VARIANT ASTHMA (ICD-493.82) due to ongoing symptoms on ashmanex, a dry powder, try change to Try off powdered inhlaters in favor of dulera.  I spent extra time with the patient today explaining optimal mdi  technique.  This improved from  50-75%  Medications Added to Medication List This Visit: 1)  Aciphex 20 Mg Tbec (Rabeprazole sodium) .... Take  one 30-60 min before first meal of the day 2)  Asmanex 120 Metered Doses 220 Mcg/inh Aepb (Mometasone furoate) .... 2 puffs every 4-6 hours as needed 3)  Proventil Hfa 108 (  90 Base) Mcg/act Aers (Albuterol sulfate) .... 2 puffs up to 4 times daily as needed  Other Orders: T-2 View CXR (71020TC) Consultation Level V (16109)  Patient Instructions: 1)  stop asthmanex 2)  start dulera 100 2 puffs first thing  in am and 2 puffs again in pm about 12 hours later  3)  only use proventil if needed 4)  take aciphex 20 mg Take  one 30-60 min before first meal of the day  5)  GERD (REFLUX)  is a common cause of respiratory symptoms. It commonly presents without heartburn and can be treated with medication, but also with lifestyle changes including avoidance of late meals, excessive alcohol, smoking cessation, and avoid fatty foods, chocolate, peppermint, colas, red wine, and acidic juices such as orange juice. NO MINT OR MENTHOL PRODUCTS SO NO COUGH DROPS  6)  USE SUGARLESS CANDY INSTEAD (jolley ranchers)  7)  NO OIL  BASED VITAMINS  8)  Please schedule a follow-up appointment in 4 weeks, sooner if needed

## 2010-03-02 NOTE — Letter (Signed)
Summary: Recall Office Visit  [facility]  [address]   [place], [place] [address]   Phone: [phone]  Fax: [phone]      November 17, 2009   [facility] [address] [place], [place]  [address] Mar 22, 1955   Dear Ms. Lemay,   According to our records, it is time for you to schedule a follow-up office visit with [place].   At your convenience, please call [phone] to schedule an office visit. If you have any questions, concerns, or feel that this letter is in error, we would appreciate your call.   Sincerely,    Diana Eves  [facility] Ph: [phone]   Fax: [phone]

## 2010-03-02 NOTE — Progress Notes (Signed)
[facility]   Imported By: Lind Guest 06/02/2009 09:30:42  _____________________________________________________________________  External Attachment:    Type:   Image     Comment:   External Document

## 2010-03-02 NOTE — Progress Notes (Signed)
Summary: prescription for [facility] and Astepro  Phone Note Call from Patient Call back at [facility] Phone [phone]   Caller: Patient Call For: wert Summary of Call: Pt wants a rx for dulera, also wants a nasal spray for post nasal drip.//[facility] [place] Initial call taken by: Darletta Moll,  November 03, 2009 4:35 PM  Follow-up for Phone Call        called and spoke with pt.  pt was last seen by MW on 08/31/2009.  MW stopped pt's asmanex and switched her to [facility].  Pt states Elwin Sleight is helping her breathing and she would like rx called into pharmacy.  Pt also c/o PND and coughing up clear drainage.  Would like rx for nasal spray to help with this.  Will forward mesage to MW to address. Arman Filter LPN  November 03, 2009 4:56 PM    ok xthree refills  but needs ov before runs out or if not satisfied. can call in astepro x 1 on trial basis for nose symptoms Follow-up by: Nyoka Cowden MD,  November 03, 2009 5:16 PM  Additional Follow-up for Phone Call Additional follow up Details #1::        called and spoke with pt.  pt aware rx sent to pharmacy and that she will need to schedule ov with MW for additional refills.  Pt verbalized her understanding and will call to schedule ov. Arman Filter LPN  November 03, 2009 5:23 PM     New/Updated Medications: DULERA 100-5 MCG/ACT AERO (MOMETASONE FURO-FORMOTEROL FUM) Inhale 2 puffs two times a day ASTEPRO 0.15 % SOLN (AZELASTINE HCL) 1 to 2 sprays in each nostril two times a day Prescriptions: ASTEPRO 0.15 % SOLN (AZELASTINE HCL) 1 to 2 sprays in each nostril two times a day  #1 x 0   Entered by:   Arman Filter LPN   Authorized by:   Nyoka Cowden MD   Signed by:   Arman Filter LPN on 62/95/2841   Method used:   Electronically to        [facility] [phone]* (retail)       [address]       [place], [place]  [address]       Ph: [phone] or [phone]       Fax: [phone]   RxID:   [id number] DULERA 100-5  MCG/ACT AERO (MOMETASONE FURO-FORMOTEROL FUM) Inhale 2 puffs two times a day  #1 x 3   Entered by:   Arman Filter LPN   Authorized by:   Nyoka Cowden MD   Signed by:   Arman Filter LPN on 23/55/7322   Method used:   Electronically to        [facility]. [phone]* (retail)       [address]       [place], [place]  [address]       Ph: [phone] or [phone]       Fax: [phone]   RxID:   [id number]

## 2010-03-02 NOTE — Progress Notes (Signed)
Summary: referral  Phone Note Call from Patient   Summary of Call: pt has appt with dr. sypher for 10/07/2009 2:00. pt is aware.  Initial call taken by: [name],  September 29, 2009 3:59 PM

## 2010-03-02 NOTE — Progress Notes (Signed)
Summary: SICK  Phone Note Call from Patient   Summary of Call: CAME IN WANTED A NOTE FOR WORK AND MEDICINE [facility] AND SHE WOULD HAVE TO BE SEEN SHE SAID SHE DID NOT FEEL LIKE  SITTING SHE HAD BEEN THROWING UP AND JUST DID NOT WANT TO SIT AND WAIT WANTED TO GO HOME AND LAY DOWN Initial call taken by: Lind Guest,  February 11, 2009 4:08 PM  Follow-up for Phone Call        Pt could habve had zofran, will send   in phenergan 25 mg and lomotil Follow-up by: [name] MD,  February 11, 2009 4:39 PM  Additional Follow-up for Phone Call Additional follow up Details #1::        left a msg on pt's  phonethat meds had been sent in and I also directly told her daughter Additional Follow-up by: [name] MD,  February 11, 2009 4:42 PM    New/Updated Medications: PROMETHAZINE HCL 25 MG TABS (PROMETHAZINE HCL) Take 1 tablet by mouth three times a day as needed LOMOTIL 2.5-0.025 MG TABS (DIPHENOXYLATE-ATROPINE) Take 1 tablet by mouth four times a day as needed Prescriptions: LOMOTIL 2.5-0.025 MG TABS (DIPHENOXYLATE-ATROPINE) Take 1 tablet by mouth four times a day as needed  #30 x 0   Entered and Authorized by:   [name] MD   Signed by:   [name] MD on 02/11/2009   Method used:   Printed then faxed to ...       [facility]  [address] [phone]* (retail)       [address]       [place], [place]  [address]       Ph: [phone] or [phone]       Fax: [phone]   RxID:   [id number] PROMETHAZINE HCL 25 MG TABS (PROMETHAZINE HCL) Take 1 tablet by mouth three times a day as needed  #30 x 0   Entered and Authorized by:   [name] MD   Signed by:   [name] MD on 02/11/2009   Method used:   Electronically to        [facility]. [phone]* (retail)       [address]       [place], [place]  [address]       Ph: [phone] or [phone]       Fax: [phone]   RxID:   [id number]

## 2010-03-02 NOTE — Miscellaneous (Signed)
  Clinical Lists Changes  Medications: Added new medication of PROMETHAZINE HCL 12.5 MG TABS (PROMETHAZINE HCL) Take 1 tablet by mouth two times a day as needed - Signed Rx of PROMETHAZINE HCL 12.5 MG TABS (PROMETHAZINE HCL) Take 1 tablet by mouth two times a day as needed;  #60 x 1;  Signed;  Entered by: [name] MD;  Authorized by: [name] MD;  Method used: Printed then faxed to [facility]. [phone]*, [address], [place], [place], [place]  [address], Ph: [phone] or [phone], Fax: [phone]    Prescriptions: PROMETHAZINE HCL 12.5 MG TABS (PROMETHAZINE HCL) Take 1 tablet by mouth two times a day as needed  #60 x 1   Entered and Authorized by:   [name] MD   Signed by:   [name] MD on 06/02/2009   Method used:   Printed then faxed to ...       [facility]  [address] [phone]* (retail)       [address]       [place], [place]  [address]       Ph: [phone] or [phone]       Fax: [phone]   RxID:   [id number]

## 2010-03-02 NOTE — Progress Notes (Signed)
  Phone Note Call from Patient   Caller: Patient Summary of Call: pls refill ibuprofen x 2 Initial call taken by: [name] MD,  September 24, 2009 5:09 PM    Prescriptions: IBUPROFEN 800 MG TABS (IBUPROFEN) Take 1 tablet by mouth three times a day as needed  #40 x 1   Entered by:   Everitt Amber LPN   Authorized by:   [name] MD   Signed by:   Everitt Amber LPN on 16/11/9602   Method used:   Electronically to        [facility]. [phone]* (retail)       [address]       [place], [place]  [address]       Ph: [phone] or [phone]       Fax: [phone]   RxID:   [id number]

## 2010-03-02 NOTE — Assessment & Plan Note (Signed)
Summary: 1 WK RE-CK ROM/BCBS/CAF   Visit Type:  Follow-up Referring Provider:  Dr. [name] Primary Provider:  [name]  CC:  left small finger.  History of Present Illness: I saw Denise Werner in the office today for a 1 week  followup visit.  She is a 55 years old woman with the complaint of:  left small finger.  Recheck ROM.  08/16/09 DOI.  regular Meds in EMR  NORCO 5-325 MG TABS (HYDROCODONE-ACETAMINOPHEN) 1 q 4 as needed pain  #42 x 1    Allergies: 1)  ! Sulfa 2)  ! * Latex 3)  ! Codeine   Wrist/Hand Exam  General:    Well-developed, well-nourished, in no acute distress; alert and oriented x 3.    Skin:    Intact with no erythema; no scarring.    Hand Exam:    Left:    Inspection/Palpation:  swelling at the PIP joint   ROM 60 degress flexion    Impression & Recommendations:  Problem # 1:  SPRAIN AND STRAIN OF INTERPHALANGEAL OF HAND (ZOX-096.04) Assessment Unchanged  Orders: Physical Therapy Referral (PT) Est. Patient Level II ([address])  Patient Instructions: 1)  Go for therapy for your finger 2)  come back in 3 weeks

## 2010-03-02 NOTE — Assessment & Plan Note (Signed)
Summary: physical   Vital Signs:  Patient profile:   55 year old female Menstrual status:  hysterectomy Height:      66 inches Weight:      151.25 pounds BMI:     24.50 O2 Sat:      98 % Pulse rate:   80 / minute Pulse rhythm:   regular Resp:     16 per minute BP sitting:   120 / 84 Cuff size:   regular  Vitals Entered By: Everitt Amber LPN (August 17, 2009 8:21 AM) CC: CPE   CC:  CPE.  Current Medications (verified): 1)  Elmiron 100 Mg  Caps (Pentosan Polysulfate Sodium) .... Take Two Caps By Mouth Two Times A Day 2)  Crestor 10 Mg  Tabs (Rosuvastatin Calcium) .... Take 1 Tab By Mouth At Bedtime On Mon, Wed, Fri 3)  Aciphex 20 Mg Tbec (Rabeprazole Sodium) .... Take 1 Tablet By Mouth Once A Day 4)  Vitamin D (Ergocalciferol) 50000 Unit Caps (Ergocalciferol) .... One Capsule Once Weekly 5)  Symbicort 80-4.5 Mcg/act Aero (Budesonide-Formoterol Fumarate) .... 2 Puffs Twice Daily 6)  Promethazine Hcl 12.5 Mg Tabs (Promethazine Hcl) .... Take 1 Tablet By Mouth Two Times A Day As Needed  Allergies (verified): 1)  ! Sulfa 2)  ! * Latex 3)  ! Codeine  Review of Systems General:  Complains of fatigue and sleep disorder. Eyes:  Complains of blurring and vision loss-both eyes; denies discharge, eye pain, and red eye. ENT:  Complains of postnasal drainage and sinus pressure. Resp:  Complains of cough, shortness of breath, and sputum productive; cloudy sdpurttum x 2 weeks, no fever or chills, cloudy post nasal drainage. GI:  Denies abdominal pain, constipation, diarrhea, and nausea. GU:  Denies dysuria and urinary frequency. MS:  Complains of joint pain, mid back pain, muscle aches, and stiffness; pt fell off a chair last night,, hit a glass table from behind, she now has swelling and decreased mobility with obvious bruising of the left 5th finger, also c/o mid back ache and right flqnk pain, also right leg pain with a bruise just distal to the knee laterally . Derm:  c/o vibrations under  the left breast for 5 days intermittently. Neuro:  Denies brief paralysis, difficulty with concentration, and headaches. Psych:  Denies anxiety and depression. Endo:  Denies cold intolerance, excessive hunger, polyuria, and weight change. Heme:  Denies abnormal bruising and bleeding. Allergy:  Complains of seasonal allergies; uncontrolled allergy symptomsc/o uncontrolled post nasal drainage, no shots in the last 2 weeks because of brearthing difficulty, has been dx with asthma in the past.  Physical Exam  General:  Well-developed,well-nourished,in no acute distress; alert,appropriate and cooperative throughout examination Head:  Normocephalic and atraumatic without obvious abnormalities. No apparent alopecia or balding. Eyes:  No corneal or conjunctival inflammation noted. EOMI. Perrla. Funduscopic exam benign, without hemorrhages, exudates or papilledema. Vision grossly normal. Ears:  External ear exam shows no significant lesions or deformities.  Otoscopic examination reveals clear canals, tympanic membranes are intact bilaterally without bulging, retraction, inflammation or discharge. Hearing is grossly normal bilaterally. Nose:  External nasal examination shows no deformity or inflammation. Nasal mucosa are pink and moist without lesions or exudates. Mouth:  Oral mucosa and oropharynx without lesions or exudates.  Teeth in good repair. Neck:  No deformities, masses, or tenderness noted. Chest Wall:  No deformities, masses, or tenderness noted. Breasts:  No mass, nodules, thickening, tenderness, bulging, retraction, inflamation, nipple discharge or skin changes noted.   Lungs:  Normal respiratory effort, chest expands symmetrically. Lungs are clear to auscultation, no crackles or wheezes. Heart:  Normal rate and regular rhythm. S1 and S2 normal without gallop, murmur, click, rub or other extra sounds. Abdomen:  Bowel sounds positive,abdomen soft and non-tender without masses, organomegaly or  hernias noted. Rectal:  No external abnormalities noted. Normal sphincter tone. No rectal masses or tenderness. Genitalia:  Normal introitus for age, no external lesions, no vaginal discharge, mucosa pink and moist, no vaginal or cervical lesions, mild vaginal atrophy, no friaility or hemorrhage, uterus absent, no adnexal masses or tenderness Msk:  No deformity or scoliosis noted of thoracic or lumbar spine.   Pulses:  R and L carotid,radial,femoral,dorsalis pedis and posterior tibial pulses are full and equal bilaterally Extremities:  No clubbing, cyanosis, edema, or deformity noted with reduced ROM mid back due to pain and spasm following recent traumas, swelling and discoloration of right 5th finger with reduced mobility following trauma Neurologic:  alert & oriented X3, strength normal in all extremities, and gait normal.   Skin:  Intact without suspicious lesions or rashes Cervical Nodes:  No lymphadenopathy noted Axillary Nodes:  No palpable lymphadenopathy Inguinal Nodes:  No significant adenopathy Psych:  Cognition and judgment appear intact. Alert and cooperative with normal attention span and concentration. No apparent delusions, illusions, hallucinations   Impression & Recommendations:  Problem # 1:  COUGH VARIANT ASTHMA (ICD-493.82) Assessment Deteriorated  Her updated medication list for this problem includes:    Symbicort 80-4.5 Mcg/act Aero (Budesonide-formoterol fumarate) .Marland Kitchen... 2 puffs twice daily    Prednisone (pak) 5 Mg Tabs (Prednisone) ..... Use as directed  Orders: Albuterol Sulfate Sol 1mg  unit dose (Z6109) Ipratropium inhalation sol. unit dose (U0454) Nebulizer Tx ([address]) Pulmonary Referral (Pulmonary)  Problem # 2:  FINGER PAIN (ICD-729.5) Assessment: Comment Only  Orders: Orthopedic Referral (Ortho) Ketorolac-Toradol 15mg  (B1478)  Problem # 3:  GERD (ICD-530.81) Assessment: Unchanged  Her updated medication list for this problem includes:    Aciphex 20  Mg Tbec (Rabeprazole sodium) .Marland Kitchen... Take 1 tablet by mouth once a day  Problem # 4:  HYPERLIPIDEMIA (ICD-272.4) Assessment: Comment Only  Her updated medication list for this problem includes:    Crestor 10 Mg Tabs (Rosuvastatin calcium) .Marland Kitchen... Take 1 tab by mouth at bedtime on mon, wed, fri  Labs Reviewed: SGOT: 16 (09/02/2008)   SGPT: 21 (09/02/2008), more updated labs from card who is now following this   HDL:50 (09/02/2008), 60 (01/11/2008)  LDL:113 (09/02/2008), 108 (01/11/2008)  Chol:173 (09/02/2008), 176 (01/11/2008)  Trig:51 (09/02/2008), 38 (01/11/2008)  Problem # 5:  ALLERGIC RHINITIS CAUSE UNSPECIFIED (ICD-477.9) Assessment: Deteriorated  The following medications were removed from the medication list:    Promethazine Hcl 12.5 Mg Supp (Promethazine hcl) .Marland Kitchen... Take 1 tablet by mouth two times a day as needed Her updated medication list for this problem includes:    Promethazine Hcl 12.5 Mg Tabs (Promethazine hcl) .Marland Kitchen... Take 1 tablet by mouth two times a day as needed  Orders: Depo- Medrol 80mg  (J1040), reports has not had injectios for 2 weeks due to uncontrolled wheezing/chest tightness  Complete Medication List: 1)  Elmiron 100 Mg Caps (Pentosan polysulfate sodium) .... Take two caps by mouth two times a day 2)  Crestor 10 Mg Tabs (Rosuvastatin calcium) .... Take 1 tab by mouth at bedtime on mon, wed, fri 3)  Aciphex 20 Mg Tbec (Rabeprazole sodium) .... Take 1 tablet by mouth once a day 4)  Vitamin D (ergocalciferol) 50000 Unit Caps (Ergocalciferol) .Marland KitchenMarland KitchenMarland Kitchen  One capsule once weekly 5)  Symbicort 80-4.5 Mcg/act Aero (Budesonide-formoterol fumarate) .... 2 puffs twice daily 6)  Promethazine Hcl 12.5 Mg Tabs (Promethazine hcl) .... Take 1 tablet by mouth two times a day as needed 7)  Prednisone (pak) 5 Mg Tabs (Prednisone) .... Use as directed 8)  Ibuprofen 800 Mg Tabs (Ibuprofen) .... Take 1 tablet by mouth three times a day as needed  Other Orders: T-Basic Metabolic Panel  ([address]) T-TSH [phone]) T-CBC w/Diff [phone]) T-Vitamin D (25-Hydroxy) [phone]) Admin of Therapeutic Inj  intramuscular or subcutaneous (28413) Pap Smear (24401) Hemoccult Guaiac-1 spec.(in office) (82270) CXR- 2view (CXR)  Patient Instructions: 1)  f/u in 5 months and 3 weeks 2)  you will be referred to ortho today. 3)  you will be referred to pulmonary about your breathing. 4)  med is sent on for uncontrolled allergies also injections will be administered for uncontrolled allergies and pain. 5)  BMP prior to visit, ICD-9: 6)  TSH prior to visit, ICD-9: 7)  CBC w/ Diff prior to visit, ICD-9:   fasting end of the month 8)  vit d Prescriptions: IBUPROFEN 800 MG TABS (IBUPROFEN) Take 1 tablet by mouth three times a day as needed  #40 x 1   Entered and Authorized by:   [name] MD   Signed by:   [name] MD on 08/17/2009   Method used:   Electronically to        [facility]. [phone]* (retail)       [address]       [place], [place]  [address]       Ph: [phone] or [phone]       Fax: [phone]   RxID:   [id number] PREDNISONE (PAK) 5 MG TABS (PREDNISONE) Use as directed  #21 x 0   Entered and Authorized by:   [name] MD   Signed by:   [name] MD on 08/17/2009   Method used:   [facility] to        [facility]  [address] [phone]* (retail)       [address]       [place], [place]  [address]       Ph: [phone] or [phone]       Fax: [phone]   RxID:   [id number]     Medication Administration  Injection # 1:    Medication: Depo- Medrol 80mg     Diagnosis: ALLERGIC RHINITIS CAUSE UNSPECIFIED (ICD-477.9)    Route: IM    Site: RUOQ gluteus    Exp Date: 4/12    Lot #: [id number]    Mfr: 4/12    Patient tolerated injection without complications    Given by: Adella Hare LPN (August 17, 2009 9:17 AM)  Injection # 2:    Medication: Ketorolac-Toradol 15mg     Diagnosis:  FINGER PAIN (ICD-729.5)    Route: IM    Site: LUOQ gluteus    Exp Date: 01/01/2011    Lot #: [id number]    Mfr: hospira    Comments: toradol 60mg  given    Patient tolerated injection without complications    Given by: Adella Hare LPN (August 17, 2009 9:18 AM)  Medication # 1:    Medication: Albuterol Sulfate Sol 1mg  unit dose    Diagnosis: COUGH VARIANT ASTHMA (ICD-493.82)    Dose: 2.5mg     Route: inhaled    Exp Date: 8/12  Lot #: [id number]    Mfr: nephron pharm    Patient tolerated medication without complications    Given by: Adella Hare LPN (August 17, 2009 9:19 AM)  Medication # 2:    Medication: Ipratropium inhalation sol. unit dose    Diagnosis: COUGH VARIANT ASTHMA (ICD-493.82)    Dose: 0.5mg     Route: inhaled    Exp Date: 10/12    Lot #: [id number]    Mfr: nephron pharm    Patient tolerated medication without complications    Given by: Adella Hare LPN (August 17, 2009 9:21 AM)  Orders Added: 1)  Est. Patient 40-64 years [99396] 2)  T-Basic Metabolic Panel [phone] 3)  T-TSH [phone] 4)  T-CBC w/Diff [32440-10272] 5)  T-Vitamin D (25-Hydroxy) [phone] 6)  Orthopedic Referral [Ortho] 7)  Depo- Medrol 80mg  [J1040] 8)  Ketorolac-Toradol 15mg  [J1885] 9)  Admin of Therapeutic Inj  intramuscular or subcutaneous [96372] 10)  Albuterol Sulfate Sol 1mg  unit dose [J7613] 11)  Ipratropium inhalation sol. unit dose [J7644] 12)  Nebulizer Tx [94640] 13)  Pap Smear [88150] 14)  Hemoccult Guaiac-1 spec.(in office) [82270] 15)  Pulmonary Referral [Pulmonary] 16)  CXR- 2view [CXR]  Laboratory Results    Stool - Occult Blood Hemmoccult #1: negative Date: 08/17/2009 Comments: 51180 9r 8/10 118 10/12

## 2010-03-02 NOTE — Letter (Signed)
Summary: EGD ORDER  EGD ORDER   Imported By: Ave Filter 06/19/2009 12:09:40  _____________________________________________________________________  External Attachment:    Type:   Image     Comment:   External Document

## 2010-03-02 NOTE — Letter (Signed)
Summary: [facility]  [facility]   Imported By: Lind Guest 10/14/2009 14:34:25  _____________________________________________________________________  External Attachment:    Type:   Image     Comment:   External Document

## 2010-03-02 NOTE — Progress Notes (Signed)
[facility]   Im[facility] By: Lind Guest 04/20/2009 11:09:14  _____________________________________________________________________  External Attachment:    Type:   Image     Comment:   External Document

## 2010-03-02 NOTE — Progress Notes (Signed)
Summary: releasing from hos  Phone Note Call from Patient   Summary of Call: wants to let you know that they are releasing dorothy today from the hospital and if need to be you can call her Initial call taken by: Lind Guest,  Jun 24, 2009 9:47 AM  Follow-up for Phone Call        pt already d/c , per dr Lendell Caprice , andno familymember diwcussed or opened up the option of long term care. Dr Lendell Caprice herself voiced concern that the pt clearly has severe dementia, i have had discussions on more than one occasion with her son Gerlene Burdock who states that all sons still feel she is best off living on her own Follow-up by: [name] MD,  Jun 24, 2009 11:42 AM  Additional Follow-up for Phone Call Additional follow up Details #1::        spoke with [name], no further actionn needed at this time Additional Follow-up by: [name] MD,  Jun 24, 2009 3:34 PM

## 2010-03-02 NOTE — Progress Notes (Signed)
Summary: speak with doc  Phone Note Call from Patient   Summary of Call: pt went and seen dr. sypher and her pinky is broken. has been broken the whole time. pt having surgery on next tues. and wanted doc to know.  Initial call taken by: [name],  October 08, 2009 2:32 PM

## 2010-03-02 NOTE — Progress Notes (Signed)
Summary: went to hand specialist  Phone Note Call from Patient   Summary of Call: Denise Werner wanted you to know that Dr. [name] referred her to a hand specalist, Dr. Teressa Senter, and he said her finger is broken. She is scheduled for surgery 10/13/09 by Dr. Teressa Senter. Her # [id number] Initial call taken by: Jacklynn Ganong,  October 08, 2009 9:04 AM

## 2010-03-02 NOTE — Progress Notes (Signed)
Summary: HAND DR  Phone Note Call from Patient   Summary of Call: WAS WONDERING HAVE YOU MADE HER AN APPOINMENT WITH A HAND DR IN [place] THE SAME ONE DR. [name] USED CALL BACK AT [phone] Initial call taken by: Lind Guest,  September 22, 2009 11:07 AM  Follow-up for Phone Call        spoke with flo and let her know dr, [name] has to review records first. then he will decided if he is going to see her.  Follow-up by: [name],  September 22, 2009 1:51 PM

## 2010-03-02 NOTE — Op Note (Signed)
Summary: Operative Report  Operative Report   Imported By: Lind Guest 10/14/2009 14:33:48  _____________________________________________________________________  External Attachment:    Type:   Image     Comment:   External Document

## 2010-03-02 NOTE — Progress Notes (Signed)
  Phone Note From Pharmacy   [facility] [place] [phone]* Summary of Call: aciphex not covered alternative? Initial call taken by: Adella Hare LPN,  October 12, 2009 11:20 AM  Follow-up for Phone Call        send msg to her ins/phartacy, see if nexium, protonix , dexilant covered whichever one is explain situation to pt and then i will send in erx Follow-up by: [name] MD,  October 12, 2009 11:41 AM  Additional Follow-up for Phone Call Additional follow up Details #1::        pantoprazole Additional Follow-up by: Adella Hare LPN,  October 12, 2009 3:41 PM    Additional Follow-up for Phone Call Additional follow up Details #2::    rx printed, pls stamp and fax and advise pt Follow-up by: [name] MD,  October 12, 2009 4:58 PM  Additional Follow-up for Phone Call Additional follow up Details #3:: Details for Additional Follow-up Action Taken: rx sent, patient aware Additional Follow-up by: Adella Hare LPN,  October 14, 2009 11:27 AM  New/Updated Medications: PANTOPRAZOLE SODIUM 40 MG TBEC (PANTOPRAZOLE SODIUM) Take 1 tablet by mouth once a day Prescriptions: PANTOPRAZOLE SODIUM 40 MG TBEC (PANTOPRAZOLE SODIUM) Take 1 tablet by mouth once a day  #30 x 3   Entered by:   Adella Hare LPN   Authorized by:   [name] MD   Signed by:   Adella Hare LPN on 20/25/4270   Method used:   Printed then faxed to ...       [facility]  [address] [phone]* (retail)       [address]       [place], [place]  [address]       Ph: [phone] or [phone]       Fax: [phone]   RxID:   [id number] PANTOPRAZOLE SODIUM 40 MG TBEC (PANTOPRAZOLE SODIUM) Take 1 tablet by mouth once a day  #30 x 3   Entered and Authorized by:   [name] MD   Signed by:   [name] MD on 10/12/2009   Method used:   Printed then faxed to ...       [facility]  [address] [phone]* (retail)       [address]       [place], [place]   [address]       Ph: [phone] or [phone]       Fax: [phone]   RxID:   [id number]

## 2010-03-02 NOTE — Progress Notes (Signed)
[facility]   Imported By: Lind Guest 04/24/2009 11:14:30  _____________________________________________________________________  External Attachment:    Type:   Image     Comment:   External Document

## 2010-03-02 NOTE — Letter (Signed)
Summary: Internal Other  Internal Other  RELEASE OF MEDICAL RECORD TO THE PATIENT   Imported By: Rexene Alberts 08/24/2009 17:00:09  _____________________________________________________________________  External Attachment:    Type:   Image     Comment:   External Document

## 2010-03-02 NOTE — Progress Notes (Signed)
Summary: nos appt  Phone Note Call from Patient   Caller: [email or web address]  Call For: wert Summary of Call: In ref to nos from 10/3, pt states she will call to rsc. Initial call taken by: Darletta Moll,  November 03, 2009 4:34 PM

## 2010-03-02 NOTE — Assessment & Plan Note (Signed)
Summary: OV   Vital Signs:  Patient profile:   55 year old female Menstrual status:  hysterectomy Height:      66 inches Weight:      149.50 pounds BMI:     24.22 O2 Sat:      98 % Pulse rate:   80 / minute Pulse rhythm:   regular Resp:     16 per minute BP sitting:   130 / 84  (left arm) Cuff size:   regular  Vitals Entered By: Everitt Amber LPN (Jun 02, 2954 4:11 PM) CC: Has been feeling nauseated for the past coupel months. She knows she has a hiatal hernia and doesn't know if that could cause it or not   CC:  Has been feeling nauseated for the past coupel months. She knows she has a hiatal hernia and doesn't know if that could cause it or not.  History of Present Illness: 2 month h/o uncontrolled  nausea with uncontrolled  reflux symptoms, daily nausea. she reports marked symtom improvement since startingimmunotherapy. She has significant anxiety, and her interstyitial cystitis symptoms are uncontrolled, she is non compliant with the meds.  Current Medications (verified): 1)  Elmiron 100 Mg  Caps (Pentosan Polysulfate Sodium) .... Take Two Caps By Mouth Two Times A Day 2)  Crestor 10 Mg  Tabs (Rosuvastatin Calcium) .... Take 1 Tab By Mouth At Bedtime On Mon, Wed, Fri 3)  Symbicort 160-4.5 Mcg/act Aero (Budesonide-Formoterol Fumarate) .... Two Puffs By Mouth Bid 4)  Nexium 40 Mg Cpdr (Esomeprazole Magnesium) .... Take 1 Capsule By Mouth Once A Day  Allergies (verified): 1)  ! Sulfa 2)  ! * Latex 3)  ! Codeine  Review of Systems      See HPI General:  Complains of sleep disorder; denies chills and fever; chronic poor sleep, continued nocturia, not taking meds for iC regularly. ENT:  Complains of nasal congestion and postnasal drainage; improved symptoms since starting immunotherapy. CV:  Denies chest pain or discomfort, lightheadness, palpitations, shortness of breath with exertion, and swelling of feet; clean bill of health by cardiology, hadfull eval this yr, being  followed for lipids only at this time. GI:  Complains of nausea; 2 month h/o uncontrolled nausea, notmuch abdominal pain, no changein bowel movem,ents, uncontrolled reflus symptoms despite beingon prescription med. Derm:  Denies itching, lesion(s), and rash. Neuro:  Denies headaches, poor balance, seizures, and sensation of room spinning. Psych:  Complains of anxiety and mental problems; denies depression, suicidal thoughts/plans, thoughts of violence, and unusual visions or sounds; alot of stress arounfd ill health of her mom in law. Endo:  Denies cold intolerance, excessive hunger, excessive thirst, excessive urination, heat intolerance, polyuria, and weight change. Heme:  Denies abnormal bruising and bleeding. Allergy:  Complains of seasonal allergies; perrenial symptoms improved wioth immunotherapy.  Physical Exam  General:  alert, well-nourished, and well-hydrated. HEENT: No facial asymmetry,  EOMI, No sinus tenderness, TM's Clear, oropharynx  pink and moist.   Chest: Clear to auscultation bilaterally.  CVS: S1, S2, No murmurs, No S3.   Abd: Soft, epigastric tenderness, no guarding or rebound. No masses or organomegaly  MS: Adequate ROM spine, hips, shoulders and knees.  Ext: No edema.   CNS: CN 2-12 intact, power tone and sensation normal throughout.   Skin: Intact, no visible lesions or rashes.  Psych: Good eye contact, normal affect.  Memory intact, not anxious or depressed appearing.      Impression & Recommendations:  Problem # 1:  HYPERLIPIDEMIA (  ICD-272.4) Assessment Comment Only  Her updated medication list for this problem includes:    Crestor 10 Mg Tabs (Rosuvastatin calcium) .Marland Kitchen... Take 1 tab by mouth at bedtime on mon, wed, fri  Labs Reviewed: SGOT: 16 (09/02/2008)   SGPT: 21 (09/02/2008)   HDL:50 (09/02/2008), 60 (01/11/2008)  LDL:113 (09/02/2008), 108 (01/11/2008)  Chol:173 (09/02/2008), 176 (01/11/2008)  Trig:51 (09/02/2008), 38 (01/11/2008)  Problem # 2:   COUGH VARIANT ASTHMA (ICD-493.82) Assessment: Improved  The following medications were removed from the medication list:    Symbicort 160-4.5 Mcg/act Aero (Budesonide-formoterol fumarate) .Marland Kitchen..Marland Kitchen Two puffs by mouth bid    Proventil Hfa 108 (90 Base) Mcg/act Aers (Albuterol sulfate) .Marland Kitchen... 2 puffs every 6 to 8 hors as needed Her updated medication list for this problem includes:    Symbicort 80-4.5 Mcg/act Aero (Budesonide-formoterol fumarate) .Marland Kitchen... 2 puffs twice daily improved with immunotherapy  Problem # 3:  ALLERGIC RHINITIS CAUSE UNSPECIFIED (ICD-477.9) Assessment: Improved  The following medications were removed from the medication list:    Promethazine Hcl 25 Mg Tabs (Promethazine hcl) .Marland Kitchen... Take 1 tablet by mouth three times a day as needed Her updated medication list for this problem includes:    Promethazine Hcl 12.5 Mg Supp (Promethazine hcl) .Marland Kitchen... Take 1 tablet by mouth two times a day as needed immunotherapy has improved symptoms  Problem # 4:  INSOMNIA (ICD-780.52) Assessment: Unchanged  The following medications were removed from the medication list:    Ambien Cr 6.25 Mg Cr-tabs (Zolpidem tartrate) .Marland Kitchen... Take 1 tab by mouth at bedtime as needed  Discussed sleep hygiene.   Problem # 5:  NAUSEA (ICD-787.02) Assessment: Deteriorated phenergan for as needed use prescribed  Complete Medication List: 1)  Elmiron 100 Mg Caps (Pentosan polysulfate sodium) .... Take two caps by mouth two times a day 2)  Crestor 10 Mg Tabs (Rosuvastatin calcium) .... Take 1 tab by mouth at bedtime on mon, wed, fri 3)  Nexium 40 Mg Cpdr (Esomeprazole magnesium) .... Take 1 capsule by mouth once a day 4)  Promethazine Hcl 12.5 Mg Supp (Promethazine hcl) .... Take 1 tablet by mouth two times a day as needed 5)  Aciphex 20 Mg Tbec (Rabeprazole sodium) .... Take 1 tablet by mouth once a day 6)  Vitamin D (ergocalciferol) 50000 Unit Caps (Ergocalciferol) .... One capsule once weekly 7)  Symbicort  80-4.5 Mcg/act Aero (Budesonide-formoterol fumarate) .... 2 puffs twice daily  Other Orders: T-Vitamin D (25-Hydroxy) ([id number]) TLB-H. Pylori Abs(Helicobacter Pylori) (86677-HELICO)  Patient Instructions: 1)  CPE  in June or July 2)  Vitamion D andH pylori 3)  New med for reflux once we get it pa 4)  Phenergan is sent in for as needed use. 5)  Pls call if you require the phergan for over 4 weeks 6)  Pls go back for the bladder probs, and I am happy  that you  have improved with the allergy shots Prescriptions: SYMBICORT 80-4.5 MCG/ACT AERO (BUDESONIDE-FORMOTEROL FUMARATE) 2 puffs twice daily  #3 x 0   Entered and Authorized by:   [name] MD   Signed by:   [name] MD on 06/02/2009   Method used:   Samples Given   RxID:   [id number] VITAMIN D (ERGOCALCIFEROL) 50000 UNIT CAPS (ERGOCALCIFEROL) one capsule once weekly  #4 x 4   Entered and Authorized by:   [name] MD   Signed by:   [name] MD on 06/02/2009   Method used:   [facility]  [facility]  [address] [phone]* (retail)       [address]       [place], [place]  [address]       Ph: [phone] or [phone]       Fax: [phone]   RxID:   [id number] ACIPHEX 20 MG TBEC (RABEPRAZOLE SODIUM) Take 1 tablet by mouth once a day  #30 x 0   Entered and Authorized by:   [name] MD   Signed by:   [name] MD on 06/01/2009   Method used:   Printed then faxed to ...       [facility]  [address] [phone]* (retail)       [address]       [place], [place]  [address]       Ph: [phone] or [phone]       Fax: [phone]   RxID:   [id number] PROMETHAZINE HCL 12.5 MG SUPP (PROMETHAZINE HCL) Take 1 tablet by mouth two times a day as needed  #60 x 4   Entered and Authorized by:   [name] MD   Signed by:   [name] MD on 06/01/2009   Method used:   Electronically to        [facility] [phone]* (retail)       [address]       [place], [place]  [address]       Ph: [phone] or [phone]       Fax: [phone]   RxID:   [id number]

## 2010-03-02 NOTE — Letter (Signed)
Summary: *Orthopedic Consult Note  [facility]  [address]  [place], [place] [address]   Phone: [phone]  Fax: [phone]    Re:    Denise Werner DOB:    1955-03-27   Dear: Claris Che   Thank you for requesting that we see the above patient for consultation.  A copy of the detailed office note will be sent under separate cover, for your review.  Evaluation today is consistent with:  1)  SPRAIN AND STRAIN OF INTERPHALANGEAL OF HAND (QQV-956.38)   Our recommendation is for: 2 weeks in a splint and then 2 weeks of buddy taping active range of motion       Thank you for this opportunity to look after your patient.  Sincerely,   Terrance Mass. MD.

## 2010-03-02 NOTE — Progress Notes (Signed)
Summary: new lab order  Phone Note Call from Patient   Summary of Call: pt needs a new lab order.  please [phone] Initial call taken by: [name],  September 14, 2009 9:46 AM  Follow-up for Phone Call        pls print the lab order  from july visit Follow-up by: [name] MD,  September 14, 2009 10:00 AM  Additional Follow-up for Phone Call Additional follow up Details #1::        printed lab and will give to pt when she comes in.  Additional Follow-up by: [name],  September 14, 2009 10:49 AM

## 2010-03-02 NOTE — Progress Notes (Signed)
Summary: allergy and asthma  allergy and asthma   Imported By: Lind Guest 07/08/2009 09:43:27  _____________________________________________________________________  External Attachment:    Type:   Image     Comment:   External Document

## 2010-03-04 NOTE — Letter (Signed)
[facility]   Imported By: Lind Guest 02/10/2010 09:10:27  _____________________________________________________________________  External Attachment:    Type:   Image     Comment:   External Document

## 2010-03-04 NOTE — Progress Notes (Signed)
  Phone Note Call from Patient   Summary of Call: 3 day h/o head congestion facial pressure ? swelling cough , chills and fatigue, clear drainage,no documented fever. Had sick contact with sibling this past weekend [place]. Offered appt , stated she has penicillin , about 15, I advised her to take 1 three timesper day, ands i will send in a prednisone dose pack also, fluids and rest, advised againstwork, but explained she nees to be seen to get a work note. she is to call fo appt if she worsens , explained the office will beclosed on Friday  Initial call taken by: [name] MD,  January 19, 2010 9:17 PM    New/Updated Medications: PREDNISONE (PAK) 5 MG TABS (PREDNISONE) Use as directed Prescriptions: PREDNISONE (PAK) 5 MG TABS (PREDNISONE) Use as directed  #21 x 0   Entered and Authorized by:   [name] MD   Signed by:   [name] MD on 01/19/2010   Method used:   Electronically to        [facility] [phone]* (retail)       [address]       [place], [place]  [address]       Ph: [phone] or [phone]       Fax: [phone]   RxID:   [id number]

## 2010-03-17 ENCOUNTER — Telehealth (INDEPENDENT_AMBULATORY_CARE_PROVIDER_SITE_OTHER): Payer: Self-pay | Admitting: *Deleted

## 2010-03-17 DIAGNOSIS — M6740 Ganglion, unspecified site: Secondary | ICD-10-CM | POA: Insufficient documentation

## 2010-03-24 NOTE — Progress Notes (Signed)
Summary: cyst on wrist  Phone Note Call from Patient   Summary of Call: patient states that she has talked to  the Dr. about a cyst in her wrist and wants to see Dr. [name]. need a refferell ent to the box for this please. Call back to let her know.  Initial call taken by: Lind Guest,  March 17, 2010 9:33 AM  Follow-up for Phone Call        noteed, referral entered Follow-up by: [name] MD,  March 17, 2010 3:04 PM  Additional Follow-up for Phone Call Additional follow up Details #1::        called and left pt a message to call office.  Additional Follow-up by: [name],  March 19, 2010 9:05 AM  New Problems: GANGLION CYST [phone])   Additional Follow-up for Phone Call Additional follow up Details #2::    pt called back this morning and she is aware of appt with dr. [name] office on 04/06/2010.  Follow-up by: [name],  March 19, 2010 10:49 AM  New Problems: GANGLION CYST (ICD-727.43)

## 2010-05-06 LAB — POCT HEMOGLOBIN-HEMACUE: Hemoglobin: 12.3 g/dL (ref 12.0–15.0)

## 2010-06-15 NOTE — H&P (Signed)
NAMELEANI, MYRON            ACCOUNT NO.:  [id number]   MEDICAL RECORD NO.:  [id number]          PATIENT TYPE:  AMB   LOCATION:  DAY                           FACILITY:  [facility]   PHYSICIAN:  Kassie Mends, M.D.      DATE OF BIRTH:  April 16, 1955   DATE OF ADMISSION:  DATE OF DISCHARGE:  LH                              HISTORY & PHYSICAL   CHIEF COMPLAINT:  Time for colonoscopy, history of polyps.   HISTORY OF PRESENT ILLNESS:  The patient is a 55 year old African  American female who has a history of hyperplastic polyp.  She presents  today as per recall calendar for her 10-year followup colonoscopy.  She  also has a history of chronic GERD and was last seen for this back in  April 2008.  The patient states that overall she has been feeling well.  She has had no problems with her bowel movements.  No blood in the stool  or melena.  Denies any abdominal pain, nausea or vomiting, heart burn,  dysphagia, or odynophagia.  Denies any weight loss.  No family history  of colon cancer.   CURRENT MEDICATIONS:  1. Elmiron p.r.n.  2. Advair p.r.n.  3. VESIcare p.r.n.   ALLERGIES:  SULFA.   PAST MEDICAL HISTORY:  She has got interstitial cystitis, asthma,  hypercholesterolemia, cholecystectomy in 1998, complete hysterectomy,  bilateral foot surgery for spurs.  Colonoscopy by Dr. Karilyn Cota in January  2001.  Normal colonoscopy and terminal ileum except for tiny polyps at  the distal sigmoid colon, which was benign.  EGD in March 2007, by Dr.  Karilyn Cota revealed nonerosive antral gastritis and bulbar duodenitis,  multiple tiny polyps with gastric body suspicious for hyperplastic  polyp, 4 were ablated via cold biopsy.  Pathology revealed small fundic  gland polyp and benign fragments.  H. pylori serologies were negative.   FAMILY HISTORY:  Mother died at age 28, stroke.  Father deceased at age  61 with aneurysm.  Sister had sinus cancer, one had 3 aneurysms.   SOCIAL HISTORY:  She is married.   She has 2 grown daughters.  She is  bookkeeper/hairstylist.  She has never smoked tobacco, no alcohol, or  drug use.   REVIEW OF SYSTEMS:  See HPI for GI.  CONSTITUTIONAL:  No weight loss.  CARDIOPULMONARY:  No chest pain, shortness of breath, palpitations, or  cough.  GENITOURINARY:  No dysuria or hematuria.   PHYSICAL EXAMINATION:  VITALS:  Weight 148, height 5 feet 6, temp 97.9,  blood pressure 120/78, and pulse 72.  GENERAL:  A pleasant, well-nourished, well-developed female in no acute  distress.  SKIN:  Warm and dry.  No jaundice.  HEENT:  Sclera nonicteric.  Oropharyngeal mucosa moist and pink.  No  lesion, erythema, or exudates.  No lymphadenopathy or thyromegaly.  CHEST:  Lungs are clear to auscultation.  CARDIAC:  Regular rate and rhythm.  No murmurs, rubs, or gallops.  ABDOMEN:  Positive bowel sounds.  Abdomen is soft, nontender, and  nondistended.  No organomegaly or masses.  No rebound or guarding.  No  abdominal bruits or hernia.  LOWER EXTREMITIES:  No edema.   IMPRESSION:  The patient is a 55 year old lady with history of benign  colon polyps who is due for her followup 10 year colonoscopy.  I have  discussed risk, alternatives, benefits with regards to, but not limited  to the risk, reaction of medications, bleeding, infection, perforation,  and she is agreeable to proceed.   PLAN:  Colonoscopy in the near future with Dr. Cira Servant.      Tana Coast, P.A.      Kassie Mends, M.D.  Electronically Signed    LL/MEDQ  D:  02/13/2008  T:  02/14/2008  Job:  [id number]   cc:   Milus Mallick. [name], M.D.  Fax: [phone]

## 2010-06-15 NOTE — Op Note (Signed)
Denise Werner, Denise Werner            ACCOUNT NO.:  [id number]   MEDICAL RECORD NO.:  [id number]          PATIENT TYPE:  AMB   LOCATION:  DAY                           FACILITY:  [facility]   PHYSICIAN:  Kassie Mends, M.D.      DATE OF BIRTH:  [birth year]   DATE OF PROCEDURE:  03/10/2008  DATE OF DISCHARGE:                                PROCEDURE NOTE   REFERRING PHYSICIAN:  Milus Mallick. [name], M.D.   PROCEDURE:  Ileocolonoscopy.   INDICATION FOR EXAMINATION:  [name] is a 55 year old female who had  hyperplastic polyp 10 years ago.   FINDINGS:  1. Normal terminal ileum, approximately for 10 cm visualized.  2. Rare sigmoid colon diverticula.  Otherwise normal colon without      evidence of polyps, masses, inflammatory changes, or AVMs.  3. Normal retroflexed view of the rectum.   DIAGNOSIS:  Mild sigmoid colon diverticulosis.   RECOMMENDATIONS:  1. Screening colonoscopy in 10 years.  2. She should follow a high-fiber diet.  She is given a handout on      high-fiber diet and diverticulosis.   MEDICATIONS:  1. Demerol 50 mg IV.  2. Versed 3 mg IV.   PROCEDURE TECHNIQUE:  Physical exam was performed.  Informed consent was  obtained from the patient after explaining the benefits, risks, and  alternatives to the procedure.  The patient was connected to the monitor  and placed in left lateral position.  Continuous oxygen was provided by  nasal cannula.  IV Medicine administered through an indwelling cannula.  After administration of sedation on anorectal exam, the patient's rectum  was  intubated.  The scope was advanced under direct visualization to the  distal terminal ileum.  The scope was removed slowly by carefully  examining the color, texture, anatomy, and integrity of the mucosa on  the way out.  The patient was recovered in endoscopy and discharged home  in satisfactory condition.      Kassie Mends, M.D.  Electronically Signed     SM/MEDQ  D:  03/10/2008  T:   03/10/2008  Job:  [id number]   cc:   Milus Mallick. [name], M.D.  Fax: [phone]

## 2010-06-18 NOTE — Procedures (Signed)
[name], Denise Werner            ACCOUNT NO.:  [id number]   MEDICAL RECORD NO.:  [id number]          PATIENT TYPE:  REC   LOCATION:                                FACILITY:  [facility]   PHYSICIAN:  Dani Gobble, MD       DATE OF BIRTH:  1955-07-28   DATE OF PROCEDURE:  DATE OF DISCHARGE:                                  ECHOCARDIOGRAM   REFERRING PHYSICIAN:  Dr. [name].   INDICATIONS:  [name] is a 55 year old female with chest pain.   Technical quality of the study is adequate.   The aorta is within normal limits, measured at 2.3 cm.   __________, measured at 3.0 cm.  No obvious clots or masses were  appreciated, and the patient appeared to be in sinus rhythm during the  procedure.   The interventricular septum and posterior wall are within normal limits.  Some thickness of leaflets, although all three leaflets were not visualized.  Overall leaflet excursion is normal.  No aortic insufficiency is noted.  Doppler interrogation of the aortic valve is within normal limits.   The mitral valve appears structurally normal.  No mitral valve prolapse is  noted.  Trivial mitral regurgitation is noted.  Doppler interrogation of the  mitral valve is within normal limits.   The pulmonic valve is incompletely visualized.   The tricuspid valve appears grossly structurally normal with mild tricuspid  regurgitation __________ regurgitation noted.   The left ventricle is normal in size with normal left ventricular systolic  function and no regional wall motion abnormalities noted.  There is no  evidence of diastolic dysfunction.  The right ventricle is normal size, and  right ventricular systolic function is normal.   IMPRESSION:  Essentially normal echocardiogram.           ______________________________  Dani Gobble, MD     AB/MEDQ  D:  10/26/2004  T:  10/27/2004  Job:  [id number]   cc:   Milus Mallick. [name], M.D.  Fax: [phone]

## 2010-06-18 NOTE — Procedures (Signed)
Denise Werner, Denise Werner            ACCOUNT NO.:  [id number]   MEDICAL RECORD NO.:  [id number]          PATIENT TYPE:  OUT   LOCATION:                                FACILITY:  [facility]   PHYSICIAN:  Edward L. Juanetta Gosling, M.D.DATE OF BIRTH:  1955-04-23   DATE OF PROCEDURE:  10/18/2004  DATE OF DISCHARGE:                              PULMONARY FUNCTION TEST   RESULTS:  1.  Spirometry is normal.  2.  Lung volumes are normal.  3.  Diffusion capacity of carbon monoxide (DLCO) is normal.      Edward L. Juanetta Gosling, M.D.  Electronically Signed     ELH/MEDQ  D:  10/18/2004  T:  10/19/2004  Job:  [id number]   cc:   Milus Mallick. [name], M.D.  Fax: [phone]

## 2010-06-18 NOTE — Procedures (Signed)
NAMEVERINA, GALENO NO.:  [id number]   MEDICAL RECORD NO.:  [id number]          PATIENT TYPE:  REC   LOCATION:                                FACILITY:  [facility]   PHYSICIAN:  Dani Gobble, MD       DATE OF BIRTH:  [birth year]   DATE OF PROCEDURE:  10/26/2004  DATE OF DISCHARGE:                                    STRESS TEST   REFERRING PHYSICIAN:  Dr. [name]   INDICATION:  Ms. Luera is a very pleasant 55 year old female with a past  medical history of hyperlipidemia who is admitted for acute chest  discomfort.   ELECTROCARDIOGRAPHIC/HEMODYNAMIC DATA:  Baseline blood pressure 110/60 mmHg  with a pulse of 82 beats per minute.  Baseline 12-lead EKG reveals normal  sinus rhythm without ischemic changes noted.  Patient walked for 8 minutes 2  seconds on a full Bruce protocol surpassing her target heart rate of 146  beats per minute proceeding to a peak heart rate of 159 beats per minute.  She experienced mild chest discomfort at peak exercise.  Of note she reports  she has had an upper respiratory tract infection with some congestion.  EKG  at peak heart rate revealed sinus tachycardia with approximately 1 mm  horizontal ST depression in III and aVF and approximately 1.5 mm horizontal  ST depression in the lateral leads.  Heart rate returned to normal within 1-  2 minutes into recovery.  She had no difficulty with shortness of breath or  arrhythmias.  She is currently chest painfree within 1-2 minutes after  initiation recovery.  Peak blood pressure 152/58 mmHg.   IMPRESSION:  1.  Clinically positive for angina.  2.  EKG borderline for ischemia.  3.  Good overall conditional status.  4.  Scintigraphic images are pending.           ______________________________  Dani Gobble, MD     AB/MEDQ  D:  10/26/2004  T:  10/26/2004  Job:  [id number]

## 2010-08-11 ENCOUNTER — Telehealth: Payer: Self-pay | Admitting: Family Medicine

## 2010-08-11 NOTE — Telephone Encounter (Signed)
Called patient, left message.

## 2010-08-16 ENCOUNTER — Other Ambulatory Visit: Payer: Self-pay | Admitting: Family Medicine

## 2010-08-16 ENCOUNTER — Telehealth: Payer: Self-pay | Admitting: Family Medicine

## 2010-08-16 DIAGNOSIS — R5383 Other fatigue: Secondary | ICD-10-CM

## 2010-08-16 DIAGNOSIS — R7301 Impaired fasting glucose: Secondary | ICD-10-CM

## 2010-08-16 DIAGNOSIS — Z13820 Encounter for screening for osteoporosis: Secondary | ICD-10-CM

## 2010-08-16 DIAGNOSIS — E785 Hyperlipidemia, unspecified: Secondary | ICD-10-CM

## 2010-08-16 NOTE — Telephone Encounter (Signed)
Needs cbc, lipid , hepatic , chem 7 , tSH and hBA1C also vit D fasting.pls order   Will order dexa,loet her know    pls advise her she needs an appt for lab review and genral exam , bP and weight , since has not been in for over 1 year and she is on med for cholesterol and also there have been concerns about her blood sugar

## 2010-08-16 NOTE — Telephone Encounter (Signed)
Patient complains of hip pain and wants to know if she can have bone density scan and if she needs any screening bloodwork

## 2010-08-17 NOTE — Telephone Encounter (Signed)
Addended by: Adella Hare B on: 08/17/2010 02:11 PM   Modules accepted: Orders

## 2010-08-17 NOTE — Telephone Encounter (Signed)
Called patient, left message, labs ordered and faxed to [facility]

## 2010-08-18 NOTE — Telephone Encounter (Signed)
Patient aware.

## 2010-08-24 LAB — HEPATIC FUNCTION PANEL
Albumin: 4.2 g/dL (ref 3.5–5.2)
Indirect Bilirubin: 0.4 mg/dL (ref 0.0–0.9)
Total Bilirubin: 0.5 mg/dL (ref 0.3–1.2)
Total Protein: 7 g/dL (ref 6.0–8.3)

## 2010-08-24 LAB — LIPID PANEL
LDL Cholesterol: 92 mg/dL (ref 0–99)
Triglycerides: 62 mg/dL (ref <150)
VLDL: 12 mg/dL (ref 0–40)

## 2010-08-24 LAB — CBC WITH DIFFERENTIAL/PLATELET
Basophils Relative: 0 % (ref 0–1)
Eosinophils Relative: 1 % (ref 0–5)
HCT: 37.8 % (ref 36.0–46.0)
Hemoglobin: 12.1 g/dL (ref 12.0–15.0)
MCH: 27.3 pg (ref 26.0–34.0)
MCHC: 32 g/dL (ref 30.0–36.0)
MCV: 85.3 fL (ref 78.0–100.0)
Monocytes Absolute: 0.4 10*3/uL (ref 0.1–1.0)
Monocytes Relative: 8 % (ref 3–12)
Neutro Abs: 2.1 10*3/uL (ref 1.7–7.7)

## 2010-08-24 LAB — HEMOGLOBIN A1C: Hgb A1c MFr Bld: 6.2 % — ABNORMAL HIGH (ref <5.7)

## 2010-08-24 LAB — BASIC METABOLIC PANEL
Chloride: 104 mEq/L (ref 96–112)
Potassium: 4.4 mEq/L (ref 3.5–5.3)

## 2010-08-25 ENCOUNTER — Other Ambulatory Visit: Payer: Self-pay | Admitting: Family Medicine

## 2010-08-25 DIAGNOSIS — Z13820 Encounter for screening for osteoporosis: Secondary | ICD-10-CM

## 2010-08-30 ENCOUNTER — Other Ambulatory Visit (HOSPITAL_COMMUNITY): Payer: Self-pay

## 2010-08-31 ENCOUNTER — Ambulatory Visit (HOSPITAL_COMMUNITY)
Admission: RE | Admit: 2010-08-31 | Discharge: 2010-08-31 | Disposition: A | Discharge status: Home / Self Care | Payer: BC Managed Care – PPO | Source: Ambulatory Visit | Attending: Family Medicine | Admitting: Family Medicine

## 2010-08-31 ENCOUNTER — Other Ambulatory Visit (HOSPITAL_COMMUNITY): Payer: Self-pay

## 2010-08-31 DIAGNOSIS — Z13820 Encounter for screening for osteoporosis: Secondary | ICD-10-CM

## 2010-08-31 DIAGNOSIS — M899 Disorder of bone, unspecified: Secondary | ICD-10-CM | POA: Insufficient documentation

## 2010-08-31 DIAGNOSIS — Z780 Asymptomatic menopausal state: Secondary | ICD-10-CM | POA: Insufficient documentation

## 2010-09-17 ENCOUNTER — Encounter: Payer: Self-pay | Admitting: Family Medicine

## 2010-09-20 ENCOUNTER — Ambulatory Visit (INDEPENDENT_AMBULATORY_CARE_PROVIDER_SITE_OTHER): Payer: BC Managed Care – PPO | Admitting: Family Medicine

## 2010-09-20 ENCOUNTER — Encounter: Payer: Self-pay | Admitting: Family Medicine

## 2010-09-20 VITALS — BP 122/80 | HR 70 | Resp 15 | Wt 148.0 lb

## 2010-09-20 DIAGNOSIS — N3010 Interstitial cystitis (chronic) without hematuria: Secondary | ICD-10-CM

## 2010-09-20 DIAGNOSIS — M8580 Other specified disorders of bone density and structure, unspecified site: Secondary | ICD-10-CM

## 2010-09-20 DIAGNOSIS — M25559 Pain in unspecified hip: Secondary | ICD-10-CM

## 2010-09-20 DIAGNOSIS — E785 Hyperlipidemia, unspecified: Secondary | ICD-10-CM

## 2010-09-20 DIAGNOSIS — M25551 Pain in right hip: Secondary | ICD-10-CM

## 2010-09-20 DIAGNOSIS — J45991 Cough variant asthma: Secondary | ICD-10-CM

## 2010-09-20 DIAGNOSIS — M899 Disorder of bone, unspecified: Secondary | ICD-10-CM

## 2010-09-20 DIAGNOSIS — J309 Allergic rhinitis, unspecified: Secondary | ICD-10-CM

## 2010-09-20 DIAGNOSIS — R7301 Impaired fasting glucose: Secondary | ICD-10-CM

## 2010-09-20 MED ORDER — ALENDRONATE SODIUM 70 MG PO TABS: 70.0000 mg | ORAL_TABLET | ORAL | Status: DC

## 2010-09-20 NOTE — Patient Instructions (Addendum)
F/U in 4 months.   HBA1C in 4 months.   Pls attend classes to learn how  To eat as a prediabetic  You are prediabetic , you need to cut back your bread and sweets  You are referred for orthopedic evaluation

## 2010-09-24 ENCOUNTER — Encounter: Payer: Self-pay | Admitting: Family Medicine

## 2010-09-26 DIAGNOSIS — N3010 Interstitial cystitis (chronic) without hematuria: Secondary | ICD-10-CM | POA: Insufficient documentation

## 2010-09-26 DIAGNOSIS — M25559 Pain in unspecified hip: Secondary | ICD-10-CM | POA: Insufficient documentation

## 2010-09-26 NOTE — Assessment & Plan Note (Signed)
HBA1C has worsened, pt advised to attend classes and will have rept hBA1C in 4 months

## 2010-09-26 NOTE — Assessment & Plan Note (Signed)
Reports recent increase in right hip pain espescialy with certain movements, will contact pt to see if she wants ortho referral

## 2010-09-26 NOTE — Assessment & Plan Note (Signed)
Controlled with use of an inhaler

## 2010-09-26 NOTE — Assessment & Plan Note (Signed)
Controlled, no change in medication  

## 2010-09-26 NOTE — Assessment & Plan Note (Signed)
Will start fosamax weekly, and is to continue calcium with D, potential increased dyspepsia, and correct way to take the pill reviewed

## 2010-09-26 NOTE — Assessment & Plan Note (Signed)
Marked improvement with immunotherapy 

## 2010-09-26 NOTE — Assessment & Plan Note (Signed)
Followed by urology, and reports fair control

## 2010-09-26 NOTE — Progress Notes (Signed)
  Subjective:    Patient ID: Denise Werner, female    DOB: 03-25-1955, 55 y.o.   MRN: [id number]  HPI The PT is here for follow up and re-evaluation of chronic medical conditions, medication management and review of any available recent lab and radiology data.  Preventive health is updated, specifically  Cancer screening and Immunization.   Questions or concerns regarding consultations or procedures which the PT has had in the interim are  addressed. The PT denies any adverse reactions to current medications since the last visit.  She is concerned about her bone density being low , espescialy in light of a fracture to her 5th finger which she sustained 1 year ago. Wants ot  Use a bone builder. She also is experiencing increased hip pain in recent times. C/o increased stress in terms of financial responsibility for the care of her mother in law, however working on resolving his by making changes in living arrangements, joint room versus single room. Follows with allergist and urologist, states her allergies have improved with immunotherapy , and he IiC is fairly well controlled on current regime     Review of Systems .See HPI Denies recent fever or chills. Denies sinus pressure, nasal congestion, ear pain or sore throat. Denies chest congestion, productive cough or wheezing. Denies chest pains, palpitations and leg swelling Denies abdominal pain, nausea, vomiting,diarrhea or constipation.   Chronic frequency, with mild dysuria. Denies headaches, seizures, numbness, or tingling. Denies depression,repports increased  anxiety , has chronic insomnia.sleeps on avg 5 hr  Denies skin break down or rash.        Objective:   Physical Exam Patient alert and oriented and in no cardiopulmonary distress.  HEENT: No facial asymmetry, EOMI,  oropharynx pink and moist.  Neck supple no adenopathy.  Chest: Clear to auscultation bilaterally.  CVS: S1, S2 no murmurs, no S3.  ABD: Soft non  tender. Bowel sounds normal.  Ext: No edema  MS: Adequate ROM spine, and knees.  Skin: Intact, no ulcerations or rash noted.  Psych: Good eye contact, normal affect. Memory intact not anxious or depressed appearing.  CNS: CN 2-12 intact, power, tone and sensation normal throughout.        Assessment & Plan:

## 2010-09-28 ENCOUNTER — Other Ambulatory Visit: Payer: Self-pay | Admitting: Family Medicine

## 2010-09-28 DIAGNOSIS — Z1231 Encounter for screening mammogram for malignant neoplasm of breast: Secondary | ICD-10-CM

## 2010-10-06 ENCOUNTER — Other Ambulatory Visit (HOSPITAL_COMMUNITY)
Admission: RE | Admit: 2010-10-06 | Discharge: 2010-10-06 | Disposition: A | Discharge status: Home / Self Care | Payer: BC Managed Care – PPO | Source: Ambulatory Visit | Attending: Family Medicine | Admitting: Family Medicine

## 2010-10-06 ENCOUNTER — Ambulatory Visit: Payer: BC Managed Care – PPO | Admitting: Family Medicine

## 2010-10-06 ENCOUNTER — Ambulatory Visit (INDEPENDENT_AMBULATORY_CARE_PROVIDER_SITE_OTHER): Payer: BC Managed Care – PPO | Admitting: Family Medicine

## 2010-10-06 ENCOUNTER — Encounter: Payer: Self-pay | Admitting: Family Medicine

## 2010-10-06 VITALS — BP 120/74 | HR 71 | Resp 16 | Ht 66.0 in | Wt 147.0 lb

## 2010-10-06 DIAGNOSIS — Z1211 Encounter for screening for malignant neoplasm of colon: Secondary | ICD-10-CM

## 2010-10-06 DIAGNOSIS — Z01419 Encounter for gynecological examination (general) (routine) without abnormal findings: Secondary | ICD-10-CM

## 2010-10-06 DIAGNOSIS — K649 Unspecified hemorrhoids: Secondary | ICD-10-CM | POA: Insufficient documentation

## 2010-10-06 DIAGNOSIS — Z23 Encounter for immunization: Secondary | ICD-10-CM

## 2010-10-06 DIAGNOSIS — Z0000 Encounter for general adult medical examination without abnormal findings: Secondary | ICD-10-CM

## 2010-10-06 DIAGNOSIS — G8929 Other chronic pain: Secondary | ICD-10-CM | POA: Insufficient documentation

## 2010-10-06 DIAGNOSIS — M542 Cervicalgia: Secondary | ICD-10-CM | POA: Insufficient documentation

## 2010-10-06 LAB — POC HEMOCCULT BLD/STL (OFFICE/1-CARD/DIAGNOSTIC): Fecal Occult Blood, POC: NEGATIVE

## 2010-10-06 MED ORDER — CYCLOBENZAPRINE HCL 5 MG PO TABS: 5.0000 mg | ORAL_TABLET | Freq: Three times a day (TID) | ORAL | Status: AC | PRN

## 2010-10-06 MED ORDER — HYDROCORTISONE ACE-PRAMOXINE 1-1 % RE FOAM: 1.0000 | Freq: Two times a day (BID) | RECTAL | Status: AC

## 2010-10-06 MED ORDER — INFLUENZA VAC TYPES A & B PF IM SUSP: 0.5000 mL | Freq: Once | INTRAMUSCULAR | Status: DC

## 2010-10-06 NOTE — Progress Notes (Signed)
  Subjective:    Patient ID: Denise Werner, female    DOB: 1955-03-08, 55 y.o.   MRN: [id number]  HPI The PT is here for annual exam Preventive health is updated, specifically  Cancer screening and Immunization.   Questions or concerns regarding consultations or procedures which the PT has had in the interim are  Addressed.Recently had her eyes examined, and has a new prescription, no cataracts or glaucoma. The PT denies any adverse reactions to current medications since the last visit.  Last night she had acute neck pain and left neck  spasm, applied topical treatment, with little relief, poor sleep as a result. C/o extreme vaginal dryness, which makes even urination painful at times, does not want estrogen due to associated risks of breast disease. Not exercising regularly , though she will commit to this at home. Has regular soft stool , so her hemoroids are not painful . Sees urology every 4 months for IC, and gets weekly immunotherapy. Hs upcoming appointment with ortho, re hip pain     Review of Systems See HPI Denies recent fever or chills. Denies sinus pressure, nasal congestion, ear pain or sore throat. Denies chest congestion, productive cough or wheezing. Denies chest pains, palpitations and leg swelling Denies abdominal pain, nausea, vomiting,diarrhea or constipation.   Chronic frequency and dysuria. Denies headaches, seizures, numbness, or tingling. Denies depression, anxiety or insomnia. Denies skin break down or rash.        Objective:   Physical Exam  Pleasant well nourished female, alert and oriented x 3, in no cardio-pulmonary distress. Afebrile. HEENT No facial trauma or asymetry. Sinuses non tender.  EOMI, PERTL,  External ears normal, tympanic membranes clear. Oropharynx moist, no exudate, good dentition. Neck:decreased ROM with left trapezius spasm, no adenopathy,JVD or thyromegaly.No bruits.  Chest: Clear to ascultation bilaterally.No crackles  or wheezes. Non tender to palpation  Breast: No asymetry,no masses. No nipple discharge or inversion. No axillary or supraclavicular adenopathy  Cardiovascular system; Heart sounds normal,  S1 and  S2 ,no S3.  No murmur, or thrill. Apical beat not displaced Peripheral pulses normal.  Abdomen: Soft, non tender, no organomegaly or masses. No bruits. Bowel sounds normal. No guarding, tenderness or rebound.  Rectal:  Hemmorhoids. Guaiac negative stool.  GU: External genitalia normal. No lesions. Vaginal canal atrophic.No discharge. Uterus absent , no adnexal masses, no  adnexal tenderness.  Musculoskeletal exam: Decreased  ROM of cervical  Spine and   hip ,adequate in  shoulders and knees.  No muscle wasting or atrophy.   Neurologic: Cranial nerves 2 to 12 intact. Power, tone ,sensation and reflexes normal throughout. No disturbance in gait. No tremor.  Skin: Intact, no ulceration, erythema , scaling or rash noted. Pigmentation normal throughout  Psych; Normal mood and affect. Judgement and concentration normal      Assessment & Plan:

## 2010-10-06 NOTE — Assessment & Plan Note (Signed)
Asymptomatic, though very swollen, regular soft stool encouraged, Will send ion script to be put on file for proctofoam

## 2010-10-06 NOTE — Assessment & Plan Note (Signed)
Acute flare 1 day ago with trapezius spasm, muscle relaxant, and anti-inflammatory. Pt will see chiropracter as needed. X ray of neck requested

## 2010-10-06 NOTE — Patient Instructions (Signed)
F/u in 6 months.  Lab in November /December as before.  It is important that you exercise regularly at least 30 minutes 5 times a week. If you develop chest pain, have severe difficulty breathing, or feel very tired, stop exercising immediately and seek medical attention     Flu vaccine today.  Use REPLENS for vaginal dryness.  A muscle relaxant is prescribed, you need an x ray of your neck, and alleve one twice daily as needed for pain, samples are given

## 2010-10-14 ENCOUNTER — Ambulatory Visit (HOSPITAL_COMMUNITY)
Admission: RE | Admit: 2010-10-14 | Discharge: 2010-10-14 | Disposition: A | Discharge status: Home / Self Care | Payer: BC Managed Care – PPO | Source: Ambulatory Visit | Attending: Family Medicine | Admitting: Family Medicine

## 2010-10-14 DIAGNOSIS — G8929 Other chronic pain: Secondary | ICD-10-CM

## 2010-10-14 DIAGNOSIS — M542 Cervicalgia: Secondary | ICD-10-CM | POA: Insufficient documentation

## 2010-10-15 ENCOUNTER — Telehealth: Payer: Self-pay | Admitting: *Deleted

## 2010-10-15 NOTE — Telephone Encounter (Signed)
Message copied by Diamantina Monks on Fri Oct 15, 2010  3:29 PM ------      Message from: [name] MD E      Created: Thu Oct 14, 2010  5:31 PM       Advise appears to have  arthritis in her neck,

## 2010-10-15 NOTE — Telephone Encounter (Signed)
Called patient, left message.

## 2010-10-18 NOTE — Telephone Encounter (Signed)
Called patient, left message.

## 2010-10-19 ENCOUNTER — Telehealth: Payer: Self-pay | Admitting: *Deleted

## 2010-10-19 NOTE — Telephone Encounter (Signed)
Patient aware of  Xray result

## 2010-10-19 NOTE — Telephone Encounter (Signed)
Message copied by Diamantina Monks on Tue Oct 19, 2010  3:07 PM ------      Message from: [name] MD E      Created: Thu Oct 14, 2010  5:31 PM       Advise appears to have  arthritis in her neck,

## 2010-10-20 ENCOUNTER — Telehealth: Payer: Self-pay | Admitting: *Deleted

## 2010-10-20 NOTE — Telephone Encounter (Signed)
Patient aware of xray result 

## 2010-10-20 NOTE — Telephone Encounter (Signed)
Message copied by Diamantina Monks on Wed Oct 20, 2010  9:02 AM ------      Message from: [name] MD E      Created: Thu Oct 14, 2010  5:31 PM       Advise appears to have  arthritis in her neck,

## 2010-10-25 ENCOUNTER — Ambulatory Visit: Payer: BC Managed Care – PPO

## 2010-11-01 ENCOUNTER — Ambulatory Visit: Payer: BC Managed Care – PPO

## 2010-11-01 ENCOUNTER — Ambulatory Visit
Admission: RE | Admit: 2010-11-01 | Discharge: 2010-11-01 | Disposition: A | Discharge status: Home / Self Care | Payer: BC Managed Care – PPO | Source: Ambulatory Visit | Attending: Family Medicine | Admitting: Family Medicine

## 2010-11-01 DIAGNOSIS — Z1231 Encounter for screening mammogram for malignant neoplasm of breast: Secondary | ICD-10-CM

## 2010-12-27 ENCOUNTER — Emergency Department (HOSPITAL_COMMUNITY): Payer: BC Managed Care – PPO

## 2010-12-27 ENCOUNTER — Emergency Department (HOSPITAL_COMMUNITY)
Admission: EM | Admit: 2010-12-27 | Discharge: 2010-12-27 | Disposition: A | Discharge status: Home / Self Care | Payer: BC Managed Care – PPO | Attending: Emergency Medicine | Admitting: Emergency Medicine

## 2010-12-27 ENCOUNTER — Encounter (HOSPITAL_COMMUNITY): Payer: Self-pay | Admitting: *Deleted

## 2010-12-27 DIAGNOSIS — N390 Urinary tract infection, site not specified: Secondary | ICD-10-CM

## 2010-12-27 DIAGNOSIS — Z9079 Acquired absence of other genital organ(s): Secondary | ICD-10-CM | POA: Insufficient documentation

## 2010-12-27 DIAGNOSIS — M549 Dorsalgia, unspecified: Secondary | ICD-10-CM | POA: Insufficient documentation

## 2010-12-27 DIAGNOSIS — R109 Unspecified abdominal pain: Secondary | ICD-10-CM | POA: Insufficient documentation

## 2010-12-27 DIAGNOSIS — Z9889 Other specified postprocedural states: Secondary | ICD-10-CM | POA: Insufficient documentation

## 2010-12-27 HISTORY — DX: Interstitial cystitis (chronic) without hematuria: N30.10

## 2010-12-27 LAB — CBC
HCT: 36.6 % (ref 36.0–46.0)
Hemoglobin: 11.7 g/dL — ABNORMAL LOW (ref 12.0–15.0)
MCHC: 32 g/dL (ref 30.0–36.0)
MCV: 85.3 fL (ref 78.0–100.0)

## 2010-12-27 LAB — URINALYSIS, ROUTINE W REFLEX MICROSCOPIC
Bilirubin Urine: NEGATIVE
Hgb urine dipstick: NEGATIVE
Ketones, ur: NEGATIVE mg/dL
Protein, ur: NEGATIVE mg/dL
Specific Gravity, Urine: 1.01 (ref 1.005–1.030)
Urobilinogen, UA: 0.2 mg/dL (ref 0.0–1.0)

## 2010-12-27 LAB — BASIC METABOLIC PANEL
BUN: 8 mg/dL (ref 6–23)
CO2: 28 mEq/L (ref 19–32)
Chloride: 105 mEq/L (ref 96–112)
Creatinine, Ser: 0.77 mg/dL (ref 0.50–1.10)

## 2010-12-27 LAB — DIFFERENTIAL
Basophils Relative: 0 % (ref 0–1)
Lymphocytes Relative: 52 % — ABNORMAL HIGH (ref 12–46)
Monocytes Absolute: 0.4 10*3/uL (ref 0.1–1.0)
Monocytes Relative: 7 % (ref 3–12)
Neutro Abs: 1.9 10*3/uL (ref 1.7–7.7)

## 2010-12-27 LAB — URINE MICROSCOPIC-ADD ON

## 2010-12-27 MED ORDER — NITROFURANTOIN MONOHYD MACRO 100 MG PO CAPS: 100.0000 mg | ORAL_CAPSULE | Freq: Two times a day (BID) | ORAL | Status: AC

## 2010-12-27 MED ORDER — NITROFURANTOIN MONOHYD MACRO 100 MG PO CAPS
100.0000 mg | ORAL_CAPSULE | Freq: Once | ORAL | Status: AC
  Administered 2010-12-27: 100 mg via ORAL
  Filled 2010-12-27: qty 1

## 2010-12-27 MED ORDER — ACETAMINOPHEN 500 MG PO TABS
1000.0000 mg | ORAL_TABLET | Freq: Once | ORAL | Status: AC
  Administered 2010-12-27: 1000 mg via ORAL
  Filled 2010-12-27: qty 2

## 2010-12-27 NOTE — ED Notes (Signed)
Pt c/o left lower back pain x 2 months. Also c/o nausea and urinary frequency. States that she has a history of interstitial cystitis.

## 2010-12-27 NOTE — ED Notes (Signed)
Pain in left flank area for over 2 months with nausea , denies any vomiting

## 2010-12-27 NOTE — ED Notes (Signed)
Advised waiting for lab and ct results

## 2010-12-27 NOTE — ED Provider Notes (Signed)
History     CSN: [id number] Arrival date & time: 12/27/2010 10:47 AM   First MD Initiated Contact with Patient 12/27/10 1114      Chief Complaint  Patient presents with  . Back Pain    (Consider location/radiation/quality/duration/timing/severity/associated sxs/prior treatment) Patient is a 55 y.o. female presenting with back pain. The history is provided by the patient. No language interpreter was used.  Back Pain  This is a chronic problem. Episode onset: began hurting ~ 2 months ago but is worse in the past few days. The problem occurs constantly. The problem has been rapidly worsening. The pain is associated with no known injury. Pain location: L posterior flank. The quality of the pain is described as burning and stabbing. The pain does not radiate. Worse during: pain ocassionally worse with movement and palpation but not always.  pt sometimes sees blood in urine but assumed it was her interstial cystitis.  no h/o kidney stones. Pertinent negatives include no fever, no weight loss, no dysuria and no pelvic pain. She has tried nothing for the symptoms. Risk factors include menopause.    Past Medical History  Diagnosis Date  . Interstitial cystitis     Past Surgical History  Procedure Date  . Appendectomy   . Cholecystectomy   . Abdominal hysterectomy   . Abdominal surgery   . Foot surgery     bilateral  . Finger surgery     left 5th finger    History reviewed. No pertinent family history.  History  Substance Use Topics  . Smoking status: Never Smoker   . Smokeless tobacco: Not on file  . Alcohol Use: No    OB History    Grav Para Term Preterm Abortions TAB SAB Ect Mult Living                  Review of Systems  Constitutional: Negative for fever, chills and weight loss.  Genitourinary: Positive for hematuria and flank pain. Negative for dysuria, urgency, frequency, vaginal bleeding, vaginal discharge, vaginal pain, menstrual problem and pelvic pain.    Musculoskeletal: Positive for back pain.  All other systems reviewed and are negative.    Allergies  Codeine; Latex; and Sulfonamide derivatives  Home Medications   Current Outpatient Rx  Name Route Sig Dispense Refill  . ALENDRONATE SODIUM 70 MG PO TABS Oral Take 1 tablet (70 mg total) by mouth every 7 (seven) days. Take with a full glass of water on an empty stomach. 4 tablet 11  . CALCIUM 1200 1200-1000 MG-UNIT PO CHEW Oral Chew 1 tablet by mouth daily.      Marland Kitchen CICLESONIDE 160 MCG/ACT IN AERS Inhalation Inhale 1 puff into the lungs as needed.      . CYCLOBENZAPRINE HCL 5 MG PO TABS Oral Take 1 tablet (5 mg total) by mouth every 8 (eight) hours as needed for muscle spasms. 60 tablet 1  . ROSUVASTATIN CALCIUM 10 MG PO TABS Oral Take 10 mg by mouth. Take 3 days a week      BP 139/80  Pulse 63  Temp(Src) 98.3 F (36.8 C) (Oral)  Resp 16  Ht 5\' 6"  (1.676 m)  Wt 146 lb (66.225 kg)  BMI 23.56 kg/m2  SpO2 100%  Physical Exam  Nursing note and vitals reviewed. Constitutional: She is oriented to person, place, and time. She appears well-developed and well-nourished. No distress.  HENT:  Head: Normocephalic and atraumatic.  Eyes: EOM are normal.  Neck: Normal range of motion.  Cardiovascular:  Normal rate, regular rhythm and normal heart sounds.   Pulmonary/Chest: Effort normal and breath sounds normal. No accessory muscle usage. Not tachypneic. No respiratory distress.  Abdominal: Soft. She exhibits no distension and no mass. There is no tenderness. There is no rebound and no guarding.  Musculoskeletal: Normal range of motion. She exhibits tenderness.       Arms: Neurological: She is alert and oriented to person, place, and time.  Skin: Skin is warm and dry.  Psychiatric: She has a normal mood and affect. Judgment normal.    ED Course  Procedures (including critical care time)  Labs Reviewed  URINALYSIS, ROUTINE W REFLEX MICROSCOPIC - Abnormal; Notable for the following:     Leukocytes, UA TRACE (*)    All other components within normal limits  CBC - Abnormal; Notable for the following:    Hemoglobin 11.7 (*)    All other components within normal limits  DIFFERENTIAL - Abnormal; Notable for the following:    Neutrophils Relative 40 (*)    Lymphocytes Relative 52 (*)    All other components within normal limits  URINE MICROSCOPIC-ADD ON  BASIC METABOLIC PANEL   No results found.   No diagnosis found.    MDM          Worthy Rancher, PA 12/27/10 [phone]

## 2010-12-27 NOTE — ED Notes (Signed)
Evaluated by dr Jeraldine Loots prior to discharge

## 2010-12-28 NOTE — ED Provider Notes (Signed)
Medical screening examination/treatment/procedure(s) were conducted as a shared visit with non-physician practitioner(s) and myself.  I personally evaluated the patient during the encounter  55yo F w interstital cystitis, now p/w months of L-sided back pain.  On exam she is in no distress, calm w stable VS.  CT w/o acute findings, and labs suggestive of UTI, w/o other sig abnormalities.  Patient was d/c w ABX, uro f/u instructions.  Gerhard Munch, MD 12/28/10 [phone]

## 2011-02-01 HISTORY — PX: KNEE ARTHROSCOPY: SUR90

## 2011-04-06 ENCOUNTER — Other Ambulatory Visit: Payer: Self-pay | Admitting: Family Medicine

## 2011-04-07 LAB — HEMOGLOBIN A1C
Hgb A1c MFr Bld: 5.9 % — ABNORMAL HIGH (ref <5.7)
Mean Plasma Glucose: 123 mg/dL — ABNORMAL HIGH (ref <117)

## 2011-05-13 ENCOUNTER — Telehealth: Payer: Self-pay | Admitting: Family Medicine

## 2011-05-13 ENCOUNTER — Encounter: Payer: Self-pay | Admitting: Family Medicine

## 2011-05-13 ENCOUNTER — Ambulatory Visit (INDEPENDENT_AMBULATORY_CARE_PROVIDER_SITE_OTHER): Payer: BC Managed Care – PPO | Admitting: Family Medicine

## 2011-05-13 VITALS — BP 124/74 | HR 74 | Temp 98.7°F | Resp 18 | Ht 66.0 in | Wt 145.1 lb

## 2011-05-13 DIAGNOSIS — J019 Acute sinusitis, unspecified: Secondary | ICD-10-CM

## 2011-05-13 DIAGNOSIS — K219 Gastro-esophageal reflux disease without esophagitis: Secondary | ICD-10-CM

## 2011-05-13 DIAGNOSIS — M949 Disorder of cartilage, unspecified: Secondary | ICD-10-CM

## 2011-05-13 DIAGNOSIS — M8580 Other specified disorders of bone density and structure, unspecified site: Secondary | ICD-10-CM

## 2011-05-13 DIAGNOSIS — M899 Disorder of bone, unspecified: Secondary | ICD-10-CM

## 2011-05-13 MED ORDER — ROSUVASTATIN CALCIUM 10 MG PO TABS: ORAL_TABLET | ORAL | Status: DC

## 2011-05-13 MED ORDER — ALENDRONATE SODIUM 70 MG PO TABS: 70.0000 mg | ORAL_TABLET | ORAL | Status: DC

## 2011-05-13 MED ORDER — AMOXICILLIN-POT CLAVULANATE 875-125 MG PO TABS: 1.0000 | ORAL_TABLET | Freq: Two times a day (BID) | ORAL | Status: AC

## 2011-05-13 MED ORDER — LANSOPRAZOLE 30 MG PO CPDR: 30.0000 mg | DELAYED_RELEASE_CAPSULE | Freq: Every day | ORAL | Status: DC

## 2011-05-13 NOTE — Patient Instructions (Signed)
I am treating for sinus infection, take the antibiotics If you do not improve I will call in steroids Try the prevacid for your heartburn F/U as previously with Dr.Simpson

## 2011-05-13 NOTE — Progress Notes (Signed)
  Subjective:    Patient ID: Denise Werner, female    DOB: 1956-01-26, 56 y.o.   MRN: [id number]  HPI Sinusitis with congestion and nasal drainage x1 week. She also admits to mild cough. This started with a blister in her nostril and then she began to feel fatigued she is a nonsmoker. Denies any fever  currently but had fever earlier this week. She did initially think this was allergies.   acid reflux-in the past was on Nexium however insurance does not cover this. She was tried on Prilosec but that did not help.    Review of Systems -per above    GEN- denies fatigue,+ fever, weight loss,weakness, recent illness HEENT- denies eye drainage, change in vision, +nasal discharge, CVS- denies chest pain, palpitations RESP- denies SOB, cough, wheeze ABD- denies N/V, change in stools, abd pain GU- denies dysuria, hematuria, dribbling, incontinence Neuro- denies headache, dizziness, syncope, seizure activity       Objective:   Physical Exam GEN- NAD, alert and oriented x3 HEENT- PERRL, EOMI, non injected sclera, pink conjunctiva, MMM, oropharynx clear, TM clear bilat, thick discharge in nares, healing sore noted on philtrum,+sinus pressure maxillary Neck- Supple, shotty LAD CVS- RRR, no murmur RESP-CTAB EXT- No edema Pulses- Radial, DP- 2+        Assessment & Plan:    Acute sinusitis- antibiotics prescribed, if she does not improve will add prednisone with her asthma history  GERD- trial of PRevacid

## 2011-05-13 NOTE — Telephone Encounter (Signed)
Noted  

## 2011-07-01 ENCOUNTER — Telehealth: Payer: Self-pay | Admitting: Family Medicine

## 2011-07-04 NOTE — Telephone Encounter (Signed)
Did you speak to her on the phone recently?

## 2011-07-04 NOTE — Telephone Encounter (Signed)
Spoke with pt

## 2011-08-22 ENCOUNTER — Other Ambulatory Visit: Payer: Self-pay | Admitting: Urology

## 2011-09-06 ENCOUNTER — Encounter (HOSPITAL_BASED_OUTPATIENT_CLINIC_OR_DEPARTMENT_OTHER): Payer: Self-pay | Admitting: *Deleted

## 2011-09-08 ENCOUNTER — Encounter (HOSPITAL_BASED_OUTPATIENT_CLINIC_OR_DEPARTMENT_OTHER): Payer: Self-pay | Admitting: *Deleted

## 2011-09-08 NOTE — Progress Notes (Signed)
NPO AFTER MN. ARRIVES AT 0915. NEEDS HG AND EKG. WILL DO INHALER AND TAKE PREVACID AM OF SURG W/ SIP OF WATER.

## 2011-09-11 NOTE — H&P (Signed)
ory of Present Illness       56 yo female with h/o chronic IC presents for continued evaluation of LUTS- for Elmiron bladder installation 6/6 today.  She has been getting Elmiron bladder installations 2x/week for the past 3 weeks and has completed a Medrol dosepak with only approximately 10% improvement in LUTS.  She has continued using Uribel and vaginal valium suppositories prn.   At her last visit, 08/12/11, she reported difficulty resting at night due to urgency/freqency with frequent, small volume voids and this has been improved with the addition of Amitriptyline qhs.  I also recently added Vesicare po qd to help with frequency and urgency and this has been only minimally helpful.  Currently, she remains miserable with increased suprapubic pressure, frequency and urgency which is constant despite the current regimen. She was initially evaluated in the office 08/02/11 with c/o increased frequency, urgency, suprapubic pressure and bladder pain ongoing for the past several weeks and progressively worsening.  She denies gross hematuria, flank pain, fever or chills.  She has been using valium vaginal suppositories prn but these are not as effective as they have been previously.  She has not seemed to be able to get this flare under control with her usual self help techniques of increased water and suppositories. She has been taking oral Elmiron recently but admits that she does not take this consistently when she is not having a flare.  Urologic Hx: She had a CTU in 12/2010 that revealed normal bilateral flank kidneys without hydroureteronephrosis, stones or masses.  No urolithiasis noted.  No acute urinary findings. She denies h/o nephrolithiasis. She has a known hx IC but this is generally well controlled. Her last IC flare was 12/2009 and responded beautifully to valium vaginal suppositories prn.  No migraines, no dyspaurnea, no IBS.  Last Cysto/HOD done 10/18/110, and pt improved. She still has frequency,  and nocturia. She has failed Vesicare. Post TAH?BSO age 77 for endometriosis. OFF Premarin because of mamogram changes.( 8 yrs ago).  She is no longer using Femring.   Past Medical History Problems  1. History of  Arthritis V13.4 2. History of  Asthma 493.90 3. History of  Bladder Irrigation 4. History of  Esophageal Reflux 530.81  Surgical History Problems  1. History of  Cholecystectomy 2. History of  Cystoscopy With Dilation Of Bladder 3. History of  Cystoscopy With Dilation Of Bladder 4. History of  Gynecologic Laparoscopy With Adhesiolysis Pelvic Peritoneum 5. History of  Hysterectomy  Current Meds 1. Alendronate Sodium 70 MG Oral Tablet; Therapy: 20Aug2012 to 2. Amitriptyline HCl 25 MG Oral Tablet; TAKE 1 TO 2 TABLETS AT BEDTIME; Therapy: 12Jul2013 to  (Evaluate:08Jan2014); Last Rx:12Jul2013 3. Calcium TABS; Therapy: (Recorded:30Dec2011) to 4. Crestor 10 MG Oral Tablet; Therapy: 12Apr2013 to 5. Cyclobenzaprine HCl 10 MG Oral Tablet; TAKE 1 TABLET TWICE DAILY AS NEEDED; Therapy:  27Nov2012 to (Evaluate:17Dec2012)  Requested for: 27Nov2012; Last Rx:27Nov2012 6. Diazepam 10 MG Oral Tablet; Vaginal 10 mgm suppositories bid; Therapy: 30Dec2011 to  (Evaluate:26May2013); Last Rx:27Nov2012 7. Elmiron 100 MG Oral Capsule; TAKE 2 CAPSULE BID; Therapy: 20Jul2009 to  (Evaluate:27Jun2014)  Requested for: 02Jul2013; Last Rx:02Jul2013 8. Ketorolac Tromethamine 10 MG Oral Tablet; TAKE 1 TABLET 3 TIMES DAILY AS NEEDED FOR  PAIN; Therapy: 02Jul2013 to (Evaluate:22Jul2013)  Requested for: 02Jul2013; Last Rx:02Jul2013 9. Ondansetron HCl 4 MG Oral Tablet; Therapy: 26Jan2013 to 10. Uribel 118 MG Oral Capsule; TAKE 1 CAPSULE Every 6 hours prn bladder pain, frequency,   urgency; Therapy: 02Jul2013 to (Last  Rx:02Jul2013) 11. VESIcare 10 MG Oral Tablet; Take 1 tablet daily; Therapy: 12Jul2013 to (Evaluate:07Feb2014);   Last Rx:12Jul2013 12. Vitamin D (Ergocalciferol) 50000 UNIT Oral Capsule; Therapy:  02May2011 to  Allergies Medication  1. Gelnique GEL 2. Sulfa Drugs  Family History Problems  1. Family history of  Family Health Status Number Of Children 2 daughters 2. Family history of  Father Deceased At Age ____ 29, stroke 3. Sororal history of  Malignant Neoplasm Of The Sinus 4. Family history of  Mother Deceased At Age ____ 58, stroke 5. Paternal history of  Transient Ischemic Attack 6. Maternal history of  Transient Ischemic Attack  Social History Problems  1. Caffeine Use 1 per day 2. Marital History - Currently Married 3. 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 History of  Alcohol Use  Review of Systems Genitourinary, constitutional, skin, eye, otolaryngeal, hematologic/lymphatic, cardiovascular, pulmonary, endocrine, musculoskeletal, gastrointestinal, neurological and psychiatric system(s) were reviewed and pertinent findings if present are noted.  Genitourinary: urinary frequency, urinary urgency and suprapubic pain.    Vitals Vital Signs [Data Includes: Last 1 Day]  19Jul2013 09:16AM  Blood Pressure: 146 / 81 Temperature: 97.8 F Heart Rate: 78  Physical Exam Constitutional: Well nourished and well developed . No acute distress.  ENT:. The ears and nose are normal in appearance.  Neck: The appearance of the neck is normal and no neck mass is present.  Pulmonary: No respiratory distress and normal respiratory rhythm and effort.  Cardiovascular:. No peripheral edema.  Abdomen: The abdomen is not distended. The abdomen is soft and nontender. No CVA tenderness.  Skin: Normal skin turgor, no visible rash and no visible skin lesions.  Neuro/Psych:. Mood and affect are appropriate.    Results/Data  The following clinical lab reports were reviewed: Marland Kitchen  Urinalysis Selected Results  UA With REFLEX 19Jul2013 09:06AM Denise Werner, Denise Werner   Test Name Result Flag Reference  COLOR YELLOW  YELLOW  APPEARANCE CLEAR  CLEAR  SPECIFIC GRAVITY 1.020   1.005-1.030  pH 6.0  5.0-8.0  GLUCOSE NEG mg/dL  NEG  BILIRUBIN NEG  NEG  KETONE NEG mg/dL  NEG  BLOOD NEG  NEG  PROTEIN NEG mg/dL  NEG  UROBILINOGEN 0.2 mg/dL  1.6-1.0  NITRITE NEG  NEG  LEUKOCYTE ESTERASE NEG  NEG   Procedure    Patient cathed and bladder completely drained. 10cc of 2% Lidocaine, 5cc of Sodium Bicarbonate and 200mg  Elmiron was instilled into patient's bladder without difficulty.  Pt tolerated this well and no complications were encountered.     Assessment Assessed  1. Chronic Interstitial Cystitis 595.1 2. Feelings Of Urinary Urgency 788.63 3. Urinary Frequency 788.41   At this point, she has failed maximal conservative management of her acute flare of chronic interstitial cystitis.  In the past, she has responded well to cysto HOD with resistant flares.  Her last cysto HOD was in 2010.  We will schedule for repeat procedure.  In the meantime, she will continue daily Elmiron, Vesicare, valium suppositories prn, Uribel prn and amitriptyline qhs.   Plan Chronic Interstitial Cystitis (595.1)  1. Anesthetic Cocktail  Done: 19Jul2013 Health Maintenance (V70.0)  2. UA With REFLEX  Done: 19Jul2013 09:06AM   Schedule cystoscopy with HOD bladder, installation of marcaine and pyridium and injection of marcain and kenalog in the subtrigone. Continue current medical regimen in the interim.   Signatures Electronically signed by : Marcello Fennel, PA-C; Aug 22 2011  4:42PM

## 2011-09-12 ENCOUNTER — Ambulatory Visit (HOSPITAL_BASED_OUTPATIENT_CLINIC_OR_DEPARTMENT_OTHER): Payer: BC Managed Care – PPO | Admitting: Anesthesiology

## 2011-09-12 ENCOUNTER — Encounter (HOSPITAL_BASED_OUTPATIENT_CLINIC_OR_DEPARTMENT_OTHER)
Admission: RE | Disposition: A | Discharge status: Home / Self Care | Payer: Self-pay | Source: Ambulatory Visit | Attending: Urology

## 2011-09-12 ENCOUNTER — Encounter (HOSPITAL_BASED_OUTPATIENT_CLINIC_OR_DEPARTMENT_OTHER): Payer: Self-pay

## 2011-09-12 ENCOUNTER — Encounter (HOSPITAL_BASED_OUTPATIENT_CLINIC_OR_DEPARTMENT_OTHER): Payer: Self-pay | Admitting: Anesthesiology

## 2011-09-12 ENCOUNTER — Ambulatory Visit (HOSPITAL_BASED_OUTPATIENT_CLINIC_OR_DEPARTMENT_OTHER)
Admission: RE | Admit: 2011-09-12 | Discharge: 2011-09-12 | Disposition: A | Discharge status: Home / Self Care | Payer: BC Managed Care – PPO | Source: Ambulatory Visit | Attending: Urology | Admitting: Urology

## 2011-09-12 DIAGNOSIS — N3010 Interstitial cystitis (chronic) without hematuria: Secondary | ICD-10-CM | POA: Insufficient documentation

## 2011-09-12 DIAGNOSIS — Z79899 Other long term (current) drug therapy: Secondary | ICD-10-CM | POA: Insufficient documentation

## 2011-09-12 DIAGNOSIS — N35919 Unspecified urethral stricture, male, unspecified site: Secondary | ICD-10-CM | POA: Insufficient documentation

## 2011-09-12 DIAGNOSIS — K219 Gastro-esophageal reflux disease without esophagitis: Secondary | ICD-10-CM | POA: Insufficient documentation

## 2011-09-12 HISTORY — DX: Cervicalgia: M54.2

## 2011-09-12 HISTORY — DX: Hyperlipidemia, unspecified: E78.5

## 2011-09-12 HISTORY — DX: Unspecified asthma, uncomplicated: J45.909

## 2011-09-12 HISTORY — DX: Nocturia: R35.1

## 2011-09-12 HISTORY — PX: CYSTO WITH HYDRODISTENSION: SHX5453

## 2011-09-12 HISTORY — DX: Urgency of urination: R39.15

## 2011-09-12 HISTORY — DX: Frequency of micturition: R35.0

## 2011-09-12 HISTORY — DX: Personal history of other diseases of the digestive system: Z87.19

## 2011-09-12 HISTORY — DX: Other chronic pain: G89.29

## 2011-09-12 HISTORY — DX: Gastro-esophageal reflux disease without esophagitis: K21.9

## 2011-09-12 HISTORY — DX: Other symptoms and signs involving the genitourinary system: R39.89

## 2011-09-12 HISTORY — DX: Shortness of breath: R06.02

## 2011-09-12 LAB — POCT HEMOGLOBIN-HEMACUE: Hemoglobin: 10 g/dL — ABNORMAL LOW (ref 12.0–15.0)

## 2011-09-12 SURGERY — CYSTOSCOPY, WITH BLADDER HYDRODISTENSION
Anesthesia: General | Site: Bladder | Wound class: Clean Contaminated

## 2011-09-12 MED ORDER — PROPOFOL 10 MG/ML IV EMUL
INTRAVENOUS | Status: DC | PRN
  Administered 2011-09-12: 160 mg via INTRAVENOUS

## 2011-09-12 MED ORDER — HYDROCODONE-ACETAMINOPHEN 7.5-650 MG PO TABS: 1.0000 | ORAL_TABLET | Freq: Four times a day (QID) | ORAL | Status: AC | PRN

## 2011-09-12 MED ORDER — KETOROLAC TROMETHAMINE 30 MG/ML IJ SOLN
INTRAMUSCULAR | Status: DC | PRN
  Administered 2011-09-12: 30 mg via INTRAVENOUS

## 2011-09-12 MED ORDER — TRIMETHOPRIM 100 MG PO TABS: 100.0000 mg | ORAL_TABLET | ORAL | Status: AC

## 2011-09-12 MED ORDER — PHENAZOPYRIDINE HCL 200 MG PO TABS: 200.0000 mg | ORAL_TABLET | Freq: Three times a day (TID) | ORAL | Status: AC | PRN

## 2011-09-12 MED ORDER — CEFAZOLIN SODIUM 1-5 GM-% IV SOLN
1.0000 g | INTRAVENOUS | Status: AC
  Administered 2011-09-12: 1 g via INTRAVENOUS

## 2011-09-12 MED ORDER — STERILE WATER FOR IRRIGATION IR SOLN
Status: DC | PRN
  Administered 2011-09-12: 3000 mL via INTRAVESICAL

## 2011-09-12 MED ORDER — LIDOCAINE HCL (CARDIAC) 20 MG/ML IV SOLN
INTRAVENOUS | Status: DC | PRN
  Administered 2011-09-12: 50 mg via INTRAVENOUS

## 2011-09-12 MED ORDER — FENTANYL CITRATE 0.05 MG/ML IJ SOLN: 25.0000 ug | INTRAMUSCULAR | Status: DC | PRN

## 2011-09-12 MED ORDER — PHENAZOPYRIDINE HCL 200 MG PO TABS
ORAL | Status: DC | PRN
  Administered 2011-09-12: 11:00:00 via INTRAVESICAL

## 2011-09-12 MED ORDER — LACTATED RINGERS IV SOLN
INTRAVENOUS | Status: DC
  Administered 2011-09-12 (×2): via INTRAVENOUS

## 2011-09-12 MED ORDER — ALBUTEROL SULFATE HFA 108 (90 BASE) MCG/ACT IN AERS
2.0000 | INHALATION_SPRAY | RESPIRATORY_TRACT | Status: DC
  Administered 2011-09-12: 2 via RESPIRATORY_TRACT

## 2011-09-12 MED ORDER — DEXAMETHASONE SODIUM PHOSPHATE 4 MG/ML IJ SOLN
INTRAMUSCULAR | Status: DC | PRN
  Administered 2011-09-12: 10 mg via INTRAVENOUS

## 2011-09-12 MED ORDER — BUPIVACAINE HCL 0.5 % IJ SOLN
INTRAMUSCULAR | Status: DC | PRN
  Administered 2011-09-12: 10 mL

## 2011-09-12 MED ORDER — PROMETHAZINE HCL 25 MG/ML IJ SOLN: 6.2500 mg | INTRAMUSCULAR | Status: DC | PRN

## 2011-09-12 MED ORDER — ONDANSETRON HCL 4 MG/2ML IJ SOLN
INTRAMUSCULAR | Status: DC | PRN
  Administered 2011-09-12: 4 mg via INTRAVENOUS

## 2011-09-12 MED ORDER — MIDAZOLAM HCL 5 MG/5ML IJ SOLN
INTRAMUSCULAR | Status: DC | PRN
  Administered 2011-09-12: 2 mg via INTRAVENOUS

## 2011-09-12 MED ORDER — FENTANYL CITRATE 0.05 MG/ML IJ SOLN
INTRAMUSCULAR | Status: DC | PRN
  Administered 2011-09-12 (×2): 50 ug via INTRAVENOUS

## 2011-09-12 MED ORDER — BELLADONNA ALKALOIDS-OPIUM 16.2-60 MG RE SUPP
RECTAL | Status: DC | PRN
  Administered 2011-09-12: 1 via RECTAL

## 2011-09-12 MED ORDER — TRIAMCINOLONE ACETONIDE 40 MG/ML IJ SUSP
INTRAMUSCULAR | Status: DC | PRN
  Administered 2011-09-12: 40 mg

## 2011-09-12 SURGICAL SUPPLY — 24 items
BAG DRAIN URO-CYSTO SKYTR STRL (DRAIN) ×2 IMPLANT
BAG DRN UROCATH (DRAIN) ×1
CANISTER SUCT LVC 12 LTR MEDI- (MISCELLANEOUS) ×1 IMPLANT
CATH SILICONE 16FRX5CC (CATHETERS) ×1 IMPLANT
CLOTH BEACON ORANGE TIMEOUT ST (SAFETY) ×2 IMPLANT
DRAPE CAMERA CLOSED 9X96 (DRAPES) ×2 IMPLANT
ELECT REM PT RETURN 9FT ADLT (ELECTROSURGICAL) ×2
ELECTRODE REM PT RTRN 9FT ADLT (ELECTROSURGICAL) ×1 IMPLANT
GLOVE SKINSENSE NS SZ7.0 (GLOVE) ×1
GLOVE SKINSENSE NS SZ7.5 (GLOVE) ×1
GLOVE SKINSENSE NS SZ8.0 LF (GLOVE) ×1
GLOVE SKINSENSE STRL SZ7.0 (GLOVE) IMPLANT
GLOVE SKINSENSE STRL SZ7.5 (GLOVE) IMPLANT
GLOVE SKINSENSE STRL SZ8.0 LF (GLOVE) ×1 IMPLANT
GOWN PREVENTION PLUS LG XLONG (DISPOSABLE) ×2 IMPLANT
GOWN STRL REIN XL XLG (GOWN DISPOSABLE) ×2 IMPLANT
NDL SAFETY ECLIPSE 18X1.5 (NEEDLE) ×1 IMPLANT
NDL SPNL 22GX7 QUINCKE BK (NEEDLE) IMPLANT
NEEDLE HYPO 18GX1.5 SHARP (NEEDLE) ×2
NEEDLE HYPO 22GX1.5 SAFETY (NEEDLE) ×2 IMPLANT
NEEDLE SPNL 22GX7 QUINCKE BK (NEEDLE) ×2 IMPLANT
PACK CYSTOSCOPY (CUSTOM PROCEDURE TRAY) ×2 IMPLANT
SYR 20CC LL (SYRINGE) ×4 IMPLANT
WATER STERILE IRR 3000ML UROMA (IV SOLUTION) ×2 IMPLANT

## 2011-09-12 NOTE — Anesthesia Preprocedure Evaluation (Addendum)
Anesthesia Evaluation  Patient identified by MRN, date of birth, ID band Patient awake    Reviewed: Allergy & Precautions, H&P , NPO status , Patient's Chart, lab work & pertinent test results  Airway Mallampati: II TM Distance: >3 FB Neck ROM: Full    Dental No notable dental hx.    Pulmonary shortness of breath, asthma ,  Room air oxygen saturation is 100%. BP 133/92 breath sounds clear to auscultation  Pulmonary exam normal       Cardiovascular negative cardio ROS  Rhythm:Regular Rate:Normal  Complaining of intermittent substernal chest discomfort. None presently. This has not been significant enough to seek medical attention.  ECG reviewed and is very similar to ECG from 2010.  Stress test from September 2006 was normal.   Neuro/Psych  Headaches, Recently complaining of headaches, which are located all around head area. She initially thought she might have sinusitis, but doesn't believe this at this time. negative psych ROS   GI/Hepatic Neg liver ROS, hiatal hernia, GERD-  Medicated,Takes prevacid PRN and has been taking recently.   Endo/Other  negative endocrine ROS  Renal/GU negative Renal ROS  negative genitourinary   Musculoskeletal negative musculoskeletal ROS (+)   Abdominal   Peds negative pediatric ROS (+)  Hematology negative hematology ROS (+)   Anesthesia Other Findings   Reproductive/Obstetrics negative OB ROS                          Anesthesia Physical Anesthesia Plan  ASA: II  Anesthesia Plan: General   Post-op Pain Management:    Induction: Intravenous  Airway Management Planned: LMA  Additional Equipment:   Intra-op Plan:   Post-operative Plan: Extubation in OR  Informed Consent: I have reviewed the patients History and Physical, chart, labs and discussed the procedure including the risks, benefits and alternatives for the proposed anesthesia with the  patient or authorized representative who has indicated his/her understanding and acceptance.   Dental advisory given  Plan Discussed with: CRNA  Anesthesia Plan Comments: (Discussed h/o intermittent chest discomfort and what this could represent. She wishes to proceed with her procedure today. I encouraged her to seek medical attention if these symptoms persist.)       Anesthesia Quick Evaluation

## 2011-09-12 NOTE — Anesthesia Postprocedure Evaluation (Signed)
  Anesthesia Post-op Note  Patient: Denise Werner  Procedure(s) Performed: Procedure(s) (LRB): CYSTOSCOPY/HYDRODISTENSION (N/A)  Patient Location: PACU  Anesthesia Type: General  Level of Consciousness: awake and alert   Airway and Oxygen Therapy: Patient Spontanous Breathing  Post-op Pain: mild  Post-op Assessment: Post-op Vital signs reviewed, Patient's Cardiovascular Status Stable, Respiratory Function Stable, Patent Airway and No signs of Nausea or vomiting  Post-op Vital Signs: stable  Complications: No apparent anesthesia complications. Slow to wake up in PACU. Breathing felt a little tight. [place] unavailable; she has taken albuterol before and so albuterol given with some improvement. Room air oxygen saturations remain 100%. Lungs sound clear bilaterally, HR 60. No apparent distress.

## 2011-09-12 NOTE — Op Note (Signed)
Pre-operative diagnosis : Interstitial cystitis  Postoperative diagnosis: Same with severe urethral stenosis, and a 600 cc bladder capacity  Operation: Urethral dilation, hydrodistention the bladder, cystourethroscopy, instillation of pretty Marcaine in the bladder, injection of Marcaine Kenalog in the subtrigonal space.  Surgeon:  Kathie Rhodes. Patsi Sears, MD  First assistant: None  Anesthesia:  general  Preparation: After appropriate preanesthesia, the patient was brought to the operating room, placed on the operating table in the dorsal supine position where general LMA anesthesia was introduced. She was replaced in the dorsal lithotomy position with pubis was prepped with Betadine solution, and draped in usual fashion. Armband was rechecked.  Review historyPresent Illness  56 yo female with h/o chronic IC presents for continued evaluation of LUTS- for Elmiron bladder installation 6/6 today. She has been getting Elmiron bladder installations 2x/week for the past 3 weeks and has completed a Medrol dosepak with only approximately 10% improvement in LUTS. She has continued using Uribel and vaginal valium suppositories prn. At her last visit, 08/12/11, she reported difficulty resting at night due to urgency/freqency with frequent, small volume voids and this has been improved with the addition of Amitriptyline qhs. I also recently added Vesicare po qd to help with frequency and urgency and this has been only minimally helpful. Currently, she remains miserable with increased suprapubic pressure, frequency and urgency which is constant despite the current regimen.  She was initially evaluated in the office 08/02/11 with c/o increased frequency, urgency, suprapubic pressure and bladder pain ongoing for the past several weeks and progressively worsening. She denies gross hematuria, flank pain, fever or chills. She has been using valium vaginal suppositories prn but these are not as effective as they have been  previously. She has not seemed to be able to get this flare under control with her usual self help techniques of increased water and suppositories. She has been taking oral Elmiron recently but admits that she does not take this consistently when she is not having a flare.  Urologic Hx:  She had a CTU in 12/2010 that revealed normal bilateral flank kidneys without hydroureteronephrosis, stones or masses. No urolithiasis noted. No acute urinary findings. She denies h/o nephrolithiasis.  She has a known hx IC but this is generally well controlled. Her last IC flare was 12/2009 and responded beautifully to valium vaginal suppositories prn. No migraines, no dyspaurnea, no IBS. Last Cysto/HOD done 10/18/110, and pt improved. She still has frequency, and nocturia. She has failed Vesicare. Post TAH?BSO age 48 for    Statement of  Likelihood of Success: Excellent. TIME-OUT observed.:  Procedure: The cystoscope could not be passed within the urethra until the urethra was dilated to a size 24 with the female urethral dilators. There was circumferential thickening of the urethra, with discoloration of the urethra and a whitish gray manner, consistent with fibrosis. Following dilation urethra, cystoscopy was accomplished, which shows normal-appearing bladder, with normal trigone and clear reflux from normal-appearing ureteral orifices. The bladder was hydrodistended to 600 cc. Cystoscopy after hydrodistention showed marked interstitial ulcerations, consistent with interstitial cystitis. Following hydrodistention, the bladder is drained of fluid. Marcaine Pyridium solution was inserted through a latex free catheter into the bladder, and then Marcaine pretty was injected into the subtrigonal space. The patient received a B. and O. suppository, and also IV Toradol and IV Tylenol. She was awakened, and taken to recovery room in good condition.

## 2011-09-12 NOTE — Transfer of Care (Signed)
Immediate Anesthesia Transfer of Care Note  Patient: Denise Werner  Procedure(s) Performed: Procedure(s) (LRB): CYSTOSCOPY/HYDRODISTENSION (N/A)  Patient Location: PACU  Anesthesia Type: General  Level of Consciousness: unresponsive  Airway & Oxygen Therapy: Patient Spontanous Breathing and Patient connected to nasal cannula oxygen  Post-op Assessment: Report given to PACU RN  Post vital signs: Reviewed and stable  Complications: No apparent anesthesia complications

## 2011-09-12 NOTE — Interval H&P Note (Signed)
History and Physical Interval Note:  09/12/2011 10:31 AM  Denise Werner  has presented today for surgery, with the diagnosis of INTERSTICIAL CYSTITIS  The various methods of treatment have been discussed with the patient and family. After consideration of risks, benefits and other options for treatment, the patient has consented to  Procedure(s) (LRB): CYSTOSCOPY/HYDRODISTENSION (N/A) as a surgical intervention .  The patient's history has been reviewed, patient examined, no change in status, stable for surgery.  I have reviewed the patient's chart and labs.  Questions were answered to the patient's satisfaction.     Jethro Bolus I

## 2011-09-12 NOTE — Anesthesia Procedure Notes (Signed)
Procedure Name: LMA Insertion Date/Time: 09/12/2011 10:40 AM Performed by: Maris Berger T Pre-anesthesia Checklist: Patient identified, Emergency Drugs available, Suction available and Patient being monitored Patient Re-evaluated:Patient Re-evaluated prior to inductionOxygen Delivery Method: Circle System Utilized Preoxygenation: Pre-oxygenation with 100% oxygen Intubation Type: IV induction Ventilation: Mask ventilation without difficulty LMA: LMA inserted LMA Size: 4.0 Number of attempts: 1 Placement Confirmation: positive ETCO2 Dental Injury: Teeth and Oropharynx as per pre-operative assessment  Comments: Gauze roll between teeth

## 2011-09-13 ENCOUNTER — Encounter: Payer: Self-pay | Admitting: Family Medicine

## 2011-09-13 ENCOUNTER — Ambulatory Visit (INDEPENDENT_AMBULATORY_CARE_PROVIDER_SITE_OTHER): Payer: BC Managed Care – PPO | Admitting: Family Medicine

## 2011-09-13 VITALS — BP 144/84 | HR 68 | Resp 16 | Ht 66.0 in | Wt 145.1 lb

## 2011-09-13 DIAGNOSIS — E538 Deficiency of other specified B group vitamins: Secondary | ICD-10-CM

## 2011-09-13 DIAGNOSIS — E785 Hyperlipidemia, unspecified: Secondary | ICD-10-CM

## 2011-09-13 DIAGNOSIS — J45991 Cough variant asthma: Secondary | ICD-10-CM

## 2011-09-13 DIAGNOSIS — I10 Essential (primary) hypertension: Secondary | ICD-10-CM | POA: Insufficient documentation

## 2011-09-13 DIAGNOSIS — R5381 Other malaise: Secondary | ICD-10-CM

## 2011-09-13 DIAGNOSIS — R5383 Other fatigue: Secondary | ICD-10-CM

## 2011-09-13 DIAGNOSIS — R7301 Impaired fasting glucose: Secondary | ICD-10-CM

## 2011-09-13 DIAGNOSIS — N3010 Interstitial cystitis (chronic) without hematuria: Secondary | ICD-10-CM

## 2011-09-13 MED ORDER — CLONIDINE HCL 0.1 MG PO TABS: ORAL_TABLET | ORAL | Status: DC

## 2011-09-13 NOTE — Anesthesia Postprocedure Evaluation (Signed)
  Anesthesia Post-op Note  Patient: Denise Werner  Procedure(s) Performed: Procedure(s) (LRB): CYSTOSCOPY/HYDRODISTENSION (N/A)  Patient Location: PACU  Anesthesia Type: General  Level of Consciousness: awake and alert   Airway and Oxygen Therapy: Patient Spontanous Breathing  Post-op Pain: mild  Post-op Assessment: Post-op Vital signs reviewed, Patient's Cardiovascular Status Stable, Respiratory Function Stable, Patent Airway and No signs of Nausea or vomiting  Post-op Vital Signs: stable  Complications: No apparent anesthesia complications

## 2011-09-13 NOTE — Patient Instructions (Addendum)
F/u in 10 weeks   You are being started on clonidine at bedtime for blood pressure.  Please eat a diet of mainly fruit and veg, fresh or frozen, cut back on sodium, added salt, canned foods, sodas  Please commit to 30 minutes of physical activity daily.    Labs will be added/ordered, fasting lipid, hepatic, chem 7, HBa1C, tSH , Vit D   You are referred for evaluation by pulmonary specialist since youi c/o wheezing at night  Crestor refilled x 6 month

## 2011-09-14 ENCOUNTER — Encounter (HOSPITAL_BASED_OUTPATIENT_CLINIC_OR_DEPARTMENT_OTHER): Payer: Self-pay | Admitting: Urology

## 2011-09-16 ENCOUNTER — Encounter: Payer: BC Managed Care – PPO | Admitting: Internal Medicine

## 2011-09-16 NOTE — Assessment & Plan Note (Signed)
Remains very symptomatic,had procedure yesterday to address this

## 2011-09-16 NOTE — Assessment & Plan Note (Addendum)
Will start low dose of med, in the past pt hashad elevated blood pressure but has resisted treatment. During procedure 1 day ago, BP noted to be elevated and she was advised to follow up, now she is" ready to take medication"  DASH diet and commitment to daily physical activity for a minimum of 30 minutes discussed and encouraged, as a part of hypertension management. The importance of attaining a healthy weight is also discussed.

## 2011-09-16 NOTE — Assessment & Plan Note (Signed)
Hyperlipidemia:Low fat diet discussed and encouraged.  Updated lab needed to asses control

## 2011-09-16 NOTE — Progress Notes (Signed)
 This encounter was created in error - please disregard.

## 2011-09-16 NOTE — Progress Notes (Signed)
  Subjective:    Patient ID: Denise Werner, female    DOB: Feb 23, 1955, 56 y.o.   MRN: [id number]  HPI The PT is here for follow up and re-evaluation of chronic medical conditions, medication management and review of any available recent lab and radiology data.  Preventive health is updated, specifically  Cancer screening and Immunization.   During  A procedure yesterday her blood pressure was elevated she is here specifically as a result of this, though her routine f/u is overdue C/o increased nocturnal wheeze, wants pulmonary to re eval C/o ongoing problems with IC which are being managed by urology C/o fatigue, has multiple awakenings due to urinary frequency, requested B12 injection , advised will hold on labs      Review of Systems See HPI Denies recent fever or chills. Denies sinus pressure, nasal congestion, ear pain or sore throat. Denies chest congestion, productive cough Denies chest pains, palpitations and leg swelling Denies abdominal pain, nausea, vomiting,diarrhea or constipation.    Denies joint pain, swelling and limitation in mobility. Denies headaches, seizures, numbness, or tingling. Denies depression, anxiety or insomnia. Denies skin break down or rash.        Objective:   Physical Exam Patient alert and oriented and in no cardiopulmonary distress.  HEENT: No facial asymmetry, EOMI, no sinus tenderness,  oropharynx pink and moist.  Neck supple no adenopathy.  Chest: Clear to auscultation bilaterally.  CVS: S1, S2 no murmurs, no S3.  ABD: Soft non tender. Bowel sounds normal.  Ext: No edema  MS: Adequate ROM spine, shoulders, hips and knees.  Skin: Intact, no ulcerations or rash noted.  Psych: Good eye contact, normal affect. Memory intact not anxious or depressed appearing.  CNS: CN 2-12 intact, power, tone and sensation normal throughout.        Assessment & Plan:

## 2011-09-16 NOTE — Assessment & Plan Note (Signed)
Reports uncontrolled symptoms with niocturmnal wheeze and chest tightness, requests re eval by pulmonary , will do same

## 2011-09-16 NOTE — Assessment & Plan Note (Signed)
Improved when last checked, updated lab needed. Patient educated about the importance of limiting  Carbohydrate intake , the need to commit to daily physical activity for a minimum of 30 minutes , and to commit weight loss. The fact that changes in all these areas will reduce or eliminate all together the development of diabetes is stressed.

## 2011-09-19 LAB — LIPID PANEL
HDL: 56 mg/dL (ref >39)
LDL Cholesterol: 104 mg/dL — ABNORMAL HIGH (ref 0–99)
Total CHOL/HDL Ratio: 3 Ratio
VLDL: 7 mg/dL (ref 0–40)

## 2011-09-19 LAB — BASIC METABOLIC PANEL
CO2: 32 mEq/L (ref 19–32)
Chloride: 105 mEq/L (ref 96–112)
Potassium: 4.3 mEq/L (ref 3.5–5.3)
Sodium: 140 mEq/L (ref 135–145)

## 2011-09-19 LAB — CBC WITH DIFFERENTIAL/PLATELET
Basophils Absolute: 0 10*3/uL (ref 0.0–0.1)
Basophils Relative: 0 % (ref 0–1)
Eosinophils Absolute: 0.1 10*3/uL (ref 0.0–0.7)
HCT: 38.6 % (ref 36.0–46.0)
MCH: 27.2 pg (ref 26.0–34.0)
MCHC: 31.6 g/dL (ref 30.0–36.0)
Monocytes Absolute: 0.6 10*3/uL (ref 0.1–1.0)
Neutro Abs: 3.2 10*3/uL (ref 1.7–7.7)
Neutrophils Relative %: 48 % (ref 43–77)
RDW: 13.9 % (ref 11.5–15.5)

## 2011-09-19 LAB — HEMOGLOBIN A1C: Mean Plasma Glucose: 120 mg/dL — ABNORMAL HIGH (ref <117)

## 2011-09-19 LAB — HEPATIC FUNCTION PANEL
Albumin: 4.1 g/dL (ref 3.5–5.2)
Alkaline Phosphatase: 71 U/L (ref 39–117)
Total Bilirubin: 0.5 mg/dL (ref 0.3–1.2)
Total Protein: 7 g/dL (ref 6.0–8.3)

## 2011-09-19 LAB — FERRITIN: Ferritin: 103 ng/mL (ref 10–291)

## 2011-09-22 ENCOUNTER — Encounter: Payer: Self-pay | Admitting: Family Medicine

## 2011-09-22 ENCOUNTER — Ambulatory Visit (INDEPENDENT_AMBULATORY_CARE_PROVIDER_SITE_OTHER): Payer: BC Managed Care – PPO | Admitting: Family Medicine

## 2011-09-22 VITALS — BP 108/74 | HR 82 | Resp 18 | Ht 66.0 in

## 2011-09-22 DIAGNOSIS — E785 Hyperlipidemia, unspecified: Secondary | ICD-10-CM

## 2011-09-22 DIAGNOSIS — I10 Essential (primary) hypertension: Secondary | ICD-10-CM

## 2011-09-22 DIAGNOSIS — R7301 Impaired fasting glucose: Secondary | ICD-10-CM

## 2011-09-22 DIAGNOSIS — Z823 Family history of stroke: Secondary | ICD-10-CM

## 2011-09-22 DIAGNOSIS — R51 Headache: Secondary | ICD-10-CM

## 2011-09-22 DIAGNOSIS — Z8249 Family history of ischemic heart disease and other diseases of the circulatory system: Secondary | ICD-10-CM

## 2011-09-22 DIAGNOSIS — R519 Headache, unspecified: Secondary | ICD-10-CM | POA: Insufficient documentation

## 2011-09-22 MED ORDER — KETOROLAC TROMETHAMINE 60 MG/2ML IJ SOLN
60.0000 mg | Freq: Once | INTRAMUSCULAR | Status: AC
  Administered 2011-09-22: 60 mg via INTRAMUSCULAR

## 2011-09-22 MED ORDER — IBUPROFEN 800 MG PO TABS: 800.0000 mg | ORAL_TABLET | Freq: Three times a day (TID) | ORAL | Status: AC | PRN

## 2011-09-22 NOTE — Patient Instructions (Signed)
You will be set up MRI  Use Ibuprofen as needed for headache Labs look good! F/U October for physical

## 2011-09-22 NOTE — Assessment & Plan Note (Signed)
A1C continue to improve, she is monitoring diet

## 2011-09-22 NOTE — Assessment & Plan Note (Addendum)
Remote migraine history, given toradol in office today, with her family history of anuersym, I will obtain MRI of head, no focal deficits Ibuprofen as needed

## 2011-09-22 NOTE — Assessment & Plan Note (Signed)
Controlled on crestor

## 2011-09-22 NOTE — Assessment & Plan Note (Signed)
Much improved, continue clonidine

## 2011-09-22 NOTE — Progress Notes (Signed)
  Subjective:    Patient ID: Denise Werner, female    DOB: 1956/01/08, 56 y.o.   MRN: [id number]  HPI Patient presents to followup labs in for headache. She has a history of migraine disorder however has not had migraine and some time now. She's been using BC powder however continues to have headache. She denies any change in vision she had nausea yesterday but this is now resolved. 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 Her family history significant for brain aneurysm her sisters had for aneurysms and status post coiling her father is deceased from a brain aneurysm.   Review of Systems   GEN- denies fatigue, fever, weight loss,weakness, recent illness HEENT- denies eye drainage, change in vision, nasal discharge, CVS- denies chest pain, palpitations RESP- denies SOB, cough, wheeze ABD- denies N/V, change in stools, abd pain GU- denies dysuria, hematuria, dribbling, incontinence MSK- denies joint pain, muscle aches, injury Neuro- +headache, dizziness, syncope, seizure activity      Objective:   Physical Exam GEN- NAD, alert and oriented x3 HEENT- PERRL, EOMI, non injected sclera, pink conjunctiva, MMM, oropharynx clear, fundoscopic exam benign Neck- Supple, normal ROM CVS- RRR, no murmur RESP-CTAB EXT- No edema Pulses- Radial, DP- 2+ NEURO-CNII-XII grossly in tact, no focal deficits       Assessment & Plan:

## 2011-09-23 ENCOUNTER — Other Ambulatory Visit (HOSPITAL_COMMUNITY): Payer: BC Managed Care – PPO

## 2011-09-23 ENCOUNTER — Ambulatory Visit: Payer: BC Managed Care – PPO | Admitting: Family Medicine

## 2011-09-23 ENCOUNTER — Telehealth: Payer: Self-pay | Admitting: Family Medicine

## 2011-09-23 NOTE — Telephone Encounter (Signed)
Patient is aware 

## 2011-09-23 NOTE — Telephone Encounter (Signed)
Appointment 8.27.13 be there at 7:45 at [facility]

## 2011-09-23 NOTE — Telephone Encounter (Signed)
Spoke with pt, this is for the anuersym in her family not the HA, she is claustrophobic therefore wanted to cancel.  Please schedule for Open MRI in [place] for a Tuesday

## 2011-09-26 ENCOUNTER — Other Ambulatory Visit: Payer: Self-pay | Admitting: Family Medicine

## 2011-09-26 DIAGNOSIS — R51 Headache: Secondary | ICD-10-CM

## 2011-09-26 DIAGNOSIS — Z8249 Family history of ischemic heart disease and other diseases of the circulatory system: Secondary | ICD-10-CM

## 2011-09-27 ENCOUNTER — Ambulatory Visit
Admission: RE | Admit: 2011-09-27 | Discharge: 2011-09-27 | Disposition: A | Discharge status: Home / Self Care | Payer: BC Managed Care – PPO | Source: Ambulatory Visit | Attending: Family Medicine | Admitting: Family Medicine

## 2011-09-27 DIAGNOSIS — Z8249 Family history of ischemic heart disease and other diseases of the circulatory system: Secondary | ICD-10-CM

## 2011-09-27 DIAGNOSIS — R51 Headache: Secondary | ICD-10-CM

## 2011-09-28 ENCOUNTER — Telehealth: Payer: Self-pay | Admitting: Family Medicine

## 2011-10-01 ENCOUNTER — Telehealth: Payer: Self-pay | Admitting: Family Medicine

## 2011-10-01 MED ORDER — PREDNISONE (PAK) 5 MG PO TABS: 5.0000 mg | ORAL_TABLET | ORAL | Status: DC

## 2011-10-01 MED ORDER — BUDESONIDE-FORMOTEROL FUMARATE 80-4.5 MCG/ACT IN AERO: 2.0000 | INHALATION_SPRAY | Freq: Two times a day (BID) | RESPIRATORY_TRACT | Status: DC

## 2011-10-01 NOTE — Telephone Encounter (Signed)
C/o wheezing and difficulty breathing, had to abort Recent MRI has a dx of asthma and is on no maintainance therapy,upcoming appt with pulmonary Will send in prednisone dose pack and symbicort, pt aware

## 2011-10-25 ENCOUNTER — Ambulatory Visit: Payer: BC Managed Care – PPO | Admitting: Internal Medicine

## 2011-10-26 ENCOUNTER — Other Ambulatory Visit: Payer: Self-pay | Admitting: Family Medicine

## 2011-10-26 DIAGNOSIS — Z1231 Encounter for screening mammogram for malignant neoplasm of breast: Secondary | ICD-10-CM

## 2011-11-07 ENCOUNTER — Encounter: Payer: Self-pay | Admitting: Internal Medicine

## 2011-11-07 ENCOUNTER — Ambulatory Visit
Admission: RE | Admit: 2011-11-07 | Discharge: 2011-11-07 | Disposition: A | Discharge status: Home / Self Care | Payer: BC Managed Care – PPO | Source: Ambulatory Visit | Attending: Family Medicine | Admitting: Family Medicine

## 2011-11-07 ENCOUNTER — Ambulatory Visit (INDEPENDENT_AMBULATORY_CARE_PROVIDER_SITE_OTHER): Payer: BC Managed Care – PPO | Admitting: Internal Medicine

## 2011-11-07 VITALS — BP 132/72 | HR 81 | Temp 97.9°F | Ht 66.0 in | Wt 148.0 lb

## 2011-11-07 DIAGNOSIS — R05 Cough: Secondary | ICD-10-CM

## 2011-11-07 DIAGNOSIS — J45991 Cough variant asthma: Secondary | ICD-10-CM

## 2011-11-07 DIAGNOSIS — Z1231 Encounter for screening mammogram for malignant neoplasm of breast: Secondary | ICD-10-CM

## 2011-11-07 MED ORDER — FAMOTIDINE 20 MG PO TABS: ORAL_TABLET | ORAL | Status: DC

## 2011-11-07 MED ORDER — LANSOPRAZOLE 30 MG PO CPDR: DELAYED_RELEASE_CAPSULE | ORAL | Status: DC

## 2011-11-07 MED ORDER — MOMETASONE FURO-FORMOTEROL FUM 100-5 MCG/ACT IN AERO: 2.0000 | INHALATION_SPRAY | Freq: Two times a day (BID) | RESPIRATORY_TRACT | Status: DC

## 2011-11-07 NOTE — Assessment & Plan Note (Signed)
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   These are the same pts (now being labeled as having "irritable larynx syndrome" by some cough centers) who not infrequently have a history of having failed to tolerate ace inhibitors,  dry powder inhalers or biphosphonates or report having atypical reflux symptoms that don't respond to standard doses of PPI esp if used prn as in her case, and are easily confused as having aecopd or asthma flares by even experienced allergists/ pulmonologists.  For now max gerd rx then regroup in 4 weeks

## 2011-11-07 NOTE — Progress Notes (Signed)
Subjective:     Patient ID: Denise Werner, female   DOB: [birth year]  MRN: [id number]  HPI 89 yobf never smoker dx'd with asthma 1995 and under the care of Dr Al Decant on shots in early 1990's but avoidance resolved symptoms then new breathing problems winter / early spring of 2011 > allergery re-eval with ragweed, grasses, roaches and started back on shots around 05/2009.   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 Avg's 4 x daily saba, not consistent with ICS, even when on symbicort. rec 1) stop asthmanex  2) start dulera 100 2 puffs first thing in am and 2 puffs again in pm about 12 hours later  3) only use proventil if needed  4) take aciphex 20 mg Take one 30-60 min before first meal of the day  5) GERD  diet  follow-up appointment in 4 weeks, sooner if needed   09/16/11 ov/ cancelled  11/07/2011 f/u ov/Denise Werner cc improved some better p last rx then much worse since 2 months on fosfamax esp at hs >  sob/ chest tightness, dry cough and using rescue twice weekly = alvesco "don't want to use it more cause it's a steroid".  No dysphagia but occ sore throat and occ hb on prn prevacid.   Sleeping ok without nocturnal  Symptoms but does tend to have early am exacerbation  Of cough, not using saba at all. Also denies any obvious fluctuation of symptoms with weather or environmental changes or other aggravating or alleviating factors except as outlined above   ROS  The following are not active complaints unless bolded sore throat, dysphagia, dental problems, itching, sneezing,  nasal congestion or excess/ purulent secretions, ear ache,   fever, chills, sweats, unintended wt loss, pleuritic or exertional cp, hemoptysis,  orthopnea pnd or leg swelling, presyncope, palpitations,  heartburn, abdominal pain, anorexia, nausea, vomiting, diarrhea  or change in bowel or urinary habits, change in stools or urine, dysuria,hematuria,  rash, arthralgias, visual complaints, headache, numbness weakness or ataxia or problems with walking or coordination,  change in mood/affect or memory.        Past Medical History:  HYPERLIPIDEMIA (ICD-272.4)  CYSTITIS (ICD-595.9)  Interstitial cystitis  Asthma  - Poor control of symptoms since 2009  EGD, 3/07, Dr. Loran Senters antral gastritis and bulbar duodenitis, multiple tiny polyps in gastric body, path - small fundic gland polyp and bengn fragments. H.Pylori serologies were negative.  TCS 2001, Dr. Truitt Leep polyp at distal sigmoid colon (hyperplastic).  TCS 03/2008, Dr. Darrick Penna --> Normal terminal ileum, approximately for 10 cm visualized. Rare sigmoid colon diverticula. Otherwise normal colon without evidence of polyps, masses, inflammatory changes, or AVMs. Next TCS 03/2018.   Marland Kitchen         Objective:   Physical Exam Hoarse amb pleasant bf nad HEENT: nl dentition, turbinates, and orophanx. Nl external ear canals without cough reflex   NECK :  without JVD/Nodes/TM/ nl carotid upstrokes bilaterally   LUNGS: no acc muscle use, clear to A and P bilaterally without cough on insp or exp maneuvers   CV:  RRR  no s3 or murmur or increase in P2, no edema   ABD:  soft and nontender with nl excursion in the supine position. No bruits or organomegaly, bowel sounds nl  MS:  warm without deformities, calf tenderness, cyanosis or clubbing  SKIN: warm and dry without lesions    NEURO:  alert, approp, no deficits      Assessment:          Plan:

## 2011-11-07 NOTE — Assessment & Plan Note (Signed)
Not clear she really has an asthmatic component here as only using alvesco prn as her "rescue" and baseline hfa so poor not getting much of it in at all  The proper method of use, as well as anticipated side effects, of a metered-dose inhaler are discussed and demonstrated to the patient. Improved effectiveness after extensive coaching during this visit to a level of approximately  75% so try dulera 100 2 every 12 hours prn flare in cough or sob.

## 2011-11-07 NOTE — Patient Instructions (Addendum)
Stop fosfamax and  all inhalers except dulera 100 1-2 puffs every 12 hours for breathing difficulty or coughing.  Work on inhaler technique:  relax and gently blow all the way out then take a nice smooth deep breath back in, triggering the inhaler at same time you start breathing in.  Hold for up to 5 seconds if you can.  Rinse and gargle with water when done   If your mouth or throat starts to bother you,   I suggest you time the inhaler to your dental care and after using the inhaler(s) brush teeth and tongue with a baking soda containing toothpaste and when you rinse this out, gargle with it first to see if this helps your mouth and throat.    Delsym cough syrup.  Try Prevacid 30   Take 30-60 min before first meal of the day and Pepcid 20 mg one bedtime until  You return  I think of reflux for chronic cough like I do oxygen for fire (doesn't cause the fire but once you get the oxygen suppressed it usually goes away regardless of the exact cause).   GERD (REFLUX)  is an extremely common cause of respiratory symptoms, many times with no significant heartburn at all.    It can be treated with medication, but also with lifestyle changes including avoidance of late meals, excessive alcohol, smoking cessation, and avoid fatty foods, chocolate, peppermint, colas, red wine, and acidic juices such as orange juice.  NO MINT OR MENTHOL PRODUCTS SO NO COUGH DROPS  USE SUGARLESS CANDY INSTEAD (jolley ranchers or Stover's)  NO OIL BASED VITAMINS - use powdered substitutes.   Please schedule a follow up office visit in 4 weeks, sooner if needed

## 2011-12-05 ENCOUNTER — Ambulatory Visit: Payer: BC Managed Care – PPO | Admitting: Internal Medicine

## 2012-01-23 ENCOUNTER — Ambulatory Visit: Payer: BC Managed Care – PPO | Admitting: Family Medicine

## 2012-01-26 ENCOUNTER — Telehealth: Payer: Self-pay | Admitting: Family Medicine

## 2012-01-26 DIAGNOSIS — I10 Essential (primary) hypertension: Secondary | ICD-10-CM

## 2012-01-26 DIAGNOSIS — M8580 Other specified disorders of bone density and structure, unspecified site: Secondary | ICD-10-CM

## 2012-01-26 DIAGNOSIS — R7301 Impaired fasting glucose: Secondary | ICD-10-CM

## 2012-01-26 DIAGNOSIS — E785 Hyperlipidemia, unspecified: Secondary | ICD-10-CM

## 2012-01-26 DIAGNOSIS — R5381 Other malaise: Secondary | ICD-10-CM

## 2012-01-26 LAB — HEMOGLOBIN A1C: Hgb A1c MFr Bld: 6 % — ABNORMAL HIGH (ref <5.7)

## 2012-01-26 LAB — LIPID PANEL
Cholesterol: 144 mg/dL (ref 0–200)
HDL: 54 mg/dL (ref >39)
Total CHOL/HDL Ratio: 2.7 Ratio
Triglycerides: 39 mg/dL (ref <150)
VLDL: 8 mg/dL (ref 0–40)

## 2012-01-26 LAB — COMPREHENSIVE METABOLIC PANEL
Albumin: 4.3 g/dL (ref 3.5–5.2)
Alkaline Phosphatase: 79 U/L (ref 39–117)
BUN: 10 mg/dL (ref 6–23)
CO2: 29 mEq/L (ref 19–32)
Glucose, Bld: 89 mg/dL (ref 70–99)
Total Bilirubin: 0.5 mg/dL (ref 0.3–1.2)
Total Protein: 6.9 g/dL (ref 6.0–8.3)

## 2012-01-26 LAB — CBC
MCH: 27.4 pg (ref 26.0–34.0)
MCHC: 32.6 g/dL (ref 30.0–36.0)
MCV: 84 fL (ref 78.0–100.0)
Platelets: 288 10*3/uL (ref 150–400)

## 2012-01-26 NOTE — Telephone Encounter (Signed)
What labs should be ordered?  

## 2012-01-26 NOTE — Telephone Encounter (Signed)
Labs ordered and patient aware that they are fasting

## 2012-01-26 NOTE — Telephone Encounter (Signed)
Cbc, lipid, cmp, hBa1C , vit D fasting please

## 2012-01-27 ENCOUNTER — Encounter: Payer: Self-pay | Admitting: Family Medicine

## 2012-01-27 ENCOUNTER — Ambulatory Visit (INDEPENDENT_AMBULATORY_CARE_PROVIDER_SITE_OTHER): Payer: BC Managed Care – PPO | Admitting: Family Medicine

## 2012-01-27 VITALS — BP 130/84 | HR 67 | Resp 16 | Ht 66.0 in | Wt 147.1 lb

## 2012-01-27 DIAGNOSIS — N3010 Interstitial cystitis (chronic) without hematuria: Secondary | ICD-10-CM

## 2012-01-27 DIAGNOSIS — I10 Essential (primary) hypertension: Secondary | ICD-10-CM

## 2012-01-27 DIAGNOSIS — R5383 Other fatigue: Secondary | ICD-10-CM

## 2012-01-27 DIAGNOSIS — R5381 Other malaise: Secondary | ICD-10-CM

## 2012-01-27 DIAGNOSIS — R7301 Impaired fasting glucose: Secondary | ICD-10-CM

## 2012-01-27 DIAGNOSIS — J45991 Cough variant asthma: Secondary | ICD-10-CM

## 2012-01-27 DIAGNOSIS — R0789 Other chest pain: Secondary | ICD-10-CM

## 2012-01-27 DIAGNOSIS — E785 Hyperlipidemia, unspecified: Secondary | ICD-10-CM

## 2012-01-27 DIAGNOSIS — N390 Urinary tract infection, site not specified: Secondary | ICD-10-CM

## 2012-01-27 LAB — POCT URINALYSIS DIPSTICK
Bilirubin, UA: NEGATIVE
Blood, UA: NEGATIVE
Glucose, UA: NEGATIVE
Leukocytes, UA: NEGATIVE
Nitrite, UA: NEGATIVE
Urobilinogen, UA: 0.2

## 2012-01-27 MED ORDER — MOMETASONE FURO-FORMOTEROL FUM 100-5 MCG/ACT IN AERO: 2.0000 | INHALATION_SPRAY | Freq: Two times a day (BID) | RESPIRATORY_TRACT | Status: DC

## 2012-01-27 NOTE — Assessment & Plan Note (Signed)
Controlled, no change in medication Hyperlipidemia:Low fat diet discussed and encouraged.  \ 

## 2012-01-27 NOTE — Assessment & Plan Note (Signed)
Deteriorated, pt needs to re commit to lower carb and blood sugar intake

## 2012-01-27 NOTE — Progress Notes (Signed)
  Subjective:    Patient ID: Denise Werner, female    DOB: 05-Apr-1955, 56 y.o.   MRN: [id number]  HPI  1 week h/o dysuria and frequency which is chronic no fever or chills , chronic left  flank pain ,no nausea or vomiting.No hematuria.Pt has interstitial cystitis and this past Summer was on an intense course of bladder irrigation for the problem. States she has not been as diligent with exercise and diet as she should , as a result her blood sugar average is increased, she plans to work on this Concerned about intermittent left chest pain, no specific aggravating factors noted, anxious, sees a lot of death, wants cardiology eval. She does have prediabetes, hyperlipidemia and hypertension Jammed left 5th finger last week, this finger has hardware following fracture, c/o pain and discoloration, has appt for ortho eval next week  Review of Systems See HPI Denies recent fever or chills. Denies sinus pressure, nasal congestion, ear pain or sore throat. Denies chest congestion or  productive cough  Denies PND, orthopnea, palpitations and leg swelling C/o  abdominal pain,denies  nausea, vomiting,diarrhea or constipation.     Denies headaches, seizures, numbness, or tingling. Denies depression, anxiety or insomnia. Denies skin break down or rash.        Objective:   Physical Exam  Patient alert and oriented and in no cardiopulmonary distress.  HEENT: No facial asymmetry, EOMI, no sinus tenderness,  oropharynx pink and moist.  Neck supple no adenopathy.  Chest: Clear to auscultation bilaterally.  CVS: S1, S2 no murmurs, no S3.No reproducible chest wall pain  ABD: Soft no localized tenderness or guarding Bowel sounds normal.  Ext: No edema  MS: Adequate ROM spine, shoulders, hips and knees.Fifth finger left chronically deformed, following fracture with limitation in mobil;ity, slightly tender over nail bed, no skin breakdown or warmth  Skin: Intact, no ulcerations or rash  noted.  Psych: Good eye contact, normal affect. Memory intact not anxious or depressed appearing.  CNS: CN 2-12 intact, power, tone and sensation normal throughout.       Assessment & Plan:

## 2012-01-27 NOTE — Assessment & Plan Note (Signed)
Symptom flare, 1 week h/o frequency, no dysuria or flank pain, CCUA normal, needs to contact urology

## 2012-01-27 NOTE — Assessment & Plan Note (Signed)
Controlled, no change in medication DASH diet and commitment to daily physical activity for a minimum of 30 minutes discussed and encouraged, as a part of hypertension management. The importance of attaining a healthy weight is also discussed.  

## 2012-01-27 NOTE — Patient Instructions (Addendum)
F/u in 5 months, call if you need me before.  You are being referred to cardiology for evaluation of intermittent chest pain.  You need to call urology re cystitis symptoms.  Follow up ortho  re left 5th finger pain.  Blood sugar has increased, please cut back on carbohydrate, bread and sweet tea , and commit to regular exercise  HBA1C and chem 7 fasting before next visit.  Urine shows no infection

## 2012-01-27 NOTE — Assessment & Plan Note (Signed)
Improved with dulera, pt states she left the  sample given to her by her pulmonary Doc [place], I have agreed to send in 1 script only, will need further scripts from pulmonary office

## 2012-01-31 ENCOUNTER — Ambulatory Visit: Payer: BC Managed Care – PPO | Admitting: Family Medicine

## 2012-02-03 ENCOUNTER — Ambulatory Visit: Payer: BC Managed Care – PPO | Admitting: Internal Medicine

## 2012-02-03 ENCOUNTER — Encounter: Payer: Self-pay | Admitting: Internal Medicine

## 2012-02-03 ENCOUNTER — Ambulatory Visit (INDEPENDENT_AMBULATORY_CARE_PROVIDER_SITE_OTHER): Payer: BC Managed Care – PPO | Admitting: Internal Medicine

## 2012-02-03 VITALS — BP 160/90 | HR 64 | Ht 66.0 in | Wt 146.0 lb

## 2012-02-03 DIAGNOSIS — I10 Essential (primary) hypertension: Secondary | ICD-10-CM

## 2012-02-03 MED ORDER — LANSOPRAZOLE 30 MG PO CPDR: 30.0000 mg | DELAYED_RELEASE_CAPSULE | Freq: Two times a day (BID) | ORAL | Status: DC

## 2012-02-03 MED ORDER — AMLODIPINE BESYLATE 2.5 MG PO TABS: 2.5000 mg | ORAL_TABLET | Freq: Every day | ORAL | Status: DC

## 2012-02-03 NOTE — Patient Instructions (Addendum)
Your physician recommends that you schedule a follow-up appointment in: 4-6weeks with PR  Your physician has recommended you make the following change in your medication:   1) STOP CLONIDINE  2) START AMLODIPINE 2.5MG  DAILY 3) INCREASE PREVACID TO TWICE DAILY

## 2012-02-03 NOTE — Progress Notes (Signed)
HPI  Patient is a 57 yo who was refereed for evaluation of CP. The patient says that the pain occurs in mid chest.  Last 3 to 5 sec.  On and off all day.  Concerned.   Note that mom died of CHF.  Dad died of aneurysm to brain. She also notes HA (frontal and L neck)     Allergies  Allergen Reactions  . Latex Rash  . Hydrocodone Nausea And Vomiting  . Sulfonamide Derivatives     Current Outpatient Prescriptions  Medication Sig Dispense Refill  . alendronate (FOSAMAX) 70 MG tablet Take 70 mg by mouth every 7 (seven) days.       . Calcium Carbonate-Vit D-Min (CALCIUM 1200) 1200-1000 MG-UNIT CHEW Chew 1 tablet by mouth daily.       . cloNIDine (CATAPRES) 0.1 MG tablet One tablet at bedtime  30 tablet  5  . ketorolac (TORADOL) 10 MG tablet Take 10 mg by mouth every 6 (six) hours as needed.       . lansoprazole (PREVACID) 30 MG capsule Take 30 mg by mouth daily. Take 30-60 min before first meal of the day      . Meth-Hyo-M Bl-Na Phos-Ph Sal (URIBEL) 118 MG CAPS Take 1 capsule by mouth as needed.       . mometasone-formoterol (DULERA) 100-5 MCG/ACT AERO Inhale 2 puffs into the lungs 2 (two) times daily.  13 g  0  . rosuvastatin (CRESTOR) 10 MG tablet Take 10 mg by mouth 3 (three) times a week. Take 3 days a week        Past Medical History  Diagnosis Date  . Interstitial cystitis   . Chronic neck pain   . H/O hiatal hernia   . GERD (gastroesophageal reflux disease) OCCASIONAL  . Dyslipidemia   . Asthma   . Short of breath on exertion   . Frequency of urination   . Urgency of urination   . Nocturia   . Bladder pain     Past Surgical History  Procedure Date  . Appendectomy   . Finger surgery 10-13-2009    left 5th finger ARTHRODESIS  . Cysto/ hod/ instillation therapy 11-17-2008;   12-02-1999    INTERSTITIAL CYSTITIS  . Foot arthroplasty 05-03-2004    FIFTHE TOE RIGHT FOOT  . Bunionectomy 04-14-2003    LEFT FOOT  W/ POST REMOVAL INTERNAL FIXATION DEVICE  VIA SILVER  BUNIONECTOMY  . Cholecystectomy 1998  . Abdominal hysterectomy     W/ BILATERAL SALPINGOOPHECTOMY  . Knee arthroscopy JAN 2013    LEFT KNEE  . Cysto with hydrodistension 09/12/2011    Procedure: CYSTOSCOPY/HYDRODISTENSION;  Surgeon: Kathi Ludwig, MD;  Location: [facility];  Service: Urology;  Laterality: N/A;  [facility] M [facility]    No family history on file.  History   Social History  . Marital Status: Married    Spouse Name: N/A    Number of Children: N/A  . Years of Education: N/A   Occupational History  . Not on file.   Social History Main Topics  . Smoking status: Never Smoker   . Smokeless tobacco: Never Used  . Alcohol Use: No  . Drug Use: No  . Sexually Active: Yes    Birth Control/ Protection: Surgical   Other Topics Concern  . Not on file   Social History Narrative  . No narrative on file    Review of Systems:  All systems reviewed.  They  are negative to the above problem except as previously stated.  Vital Signs: BP 160/90  Pulse 64  Ht 5\' 6"  (1.676 m)  Wt 146 lb (66.225 kg)  BMI 23.56 kg/m2  SpO2 99%  Physical Exam Patient is in NAD HEENT:  Normocephalic, atraumatic. EOMI, PERRLA.  Neck: JVP is normal.  No bruits.  Lungs: clear to auscultation. No rales no wheezes.  Heart: Regular rate and rhythm. Normal S1, S2. No S3.   No significant murmurs. PMI not displaced.  Abdomen:  Supple, nontender. Normal bowel sounds. No masses. No hepatomegaly.  Extremities:   Good distal pulses throughout. No lower extremity edema.  Musculoskeletal :moving all extremities.  Neuro:   alert and oriented x3.  CN II-XII grossly intact.  EKG  SR 68 bpm. Assessment and Plan:  1.  CP  Atypical  I do not think it represents angain.  Consider GI  Would increase prevacid to bid  2.  HTN  BP is not controlled.  I would add amlodipine to regimen.  5 mg.      3.  HL  COntinue statin  F/U in 4 to 6 wks.

## 2012-02-09 ENCOUNTER — Other Ambulatory Visit: Payer: Self-pay | Admitting: *Deleted

## 2012-02-14 ENCOUNTER — Other Ambulatory Visit: Payer: Self-pay | Admitting: *Deleted

## 2012-02-14 MED ORDER — LANSOPRAZOLE 30 MG PO CPDR: 30.0000 mg | DELAYED_RELEASE_CAPSULE | Freq: Two times a day (BID) | ORAL | Status: DC

## 2012-03-23 ENCOUNTER — Ambulatory Visit (INDEPENDENT_AMBULATORY_CARE_PROVIDER_SITE_OTHER): Payer: BC Managed Care – PPO | Admitting: Internal Medicine

## 2012-03-23 ENCOUNTER — Encounter: Payer: Self-pay | Admitting: Internal Medicine

## 2012-03-23 VITALS — BP 117/79 | HR 75 | Ht 66.0 in | Wt 148.0 lb

## 2012-03-23 DIAGNOSIS — R079 Chest pain, unspecified: Secondary | ICD-10-CM

## 2012-03-23 NOTE — Patient Instructions (Signed)
Your physician recommends that you schedule a follow-up appointment in: 1 yer

## 2012-03-23 NOTE — Progress Notes (Signed)
HPI Patient is a 57 yo who I saw in early Jan 2014  Referred for CP which I thought was atypical  Increased prevacid BP was elevated and I added amlodipine 2.5. Since seen still has intermittent CP  Lasts seconds Does note some thigh pain that she has had for years.  Worse with lying and standing  Allergies  Allergen Reactions  . Latex Rash  . Hydrocodone Nausea And Vomiting  . Sulfonamide Derivatives     Current Outpatient Prescriptions  Medication Sig Dispense Refill  . alendronate (FOSAMAX) 70 MG tablet Take 70 mg by mouth every 7 (seven) days.       Marland Kitchen amLODipine (NORVASC) 2.5 MG tablet Take 1 tablet (2.5 mg total) by mouth daily.  30 tablet  3  . Calcium Carbonate-Vit D-Min (CALCIUM 1200) 1200-1000 MG-UNIT CHEW Chew 1 tablet by mouth daily.       . cloNIDine (CATAPRES) 0.1 MG tablet       . cyclobenzaprine (FLEXERIL) 10 MG tablet       . ketorolac (TORADOL) 10 MG tablet Take 10 mg by mouth every 6 (six) hours as needed.       . lansoprazole (PREVACID) 30 MG capsule Take 1 capsule (30 mg total) by mouth 2 (two) times daily. Take 30-60 min before first meal of the day  60 capsule  5  . Meth-Hyo-M Bl-Na Phos-Ph Sal (URIBEL) 118 MG CAPS Take 1 capsule by mouth as needed.       . mometasone-formoterol (DULERA) 100-5 MCG/ACT AERO Inhale 2 puffs into the lungs 2 (two) times daily.  13 g  0  . rosuvastatin (CRESTOR) 10 MG tablet Take 10 mg by mouth 3 (three) times a week. Take 3 days a week       No current facility-administered medications for this visit.    Past Medical History  Diagnosis Date  . Interstitial cystitis   . Chronic neck pain   . H/O hiatal hernia   . GERD (gastroesophageal reflux disease) OCCASIONAL  . Dyslipidemia   . Asthma   . Short of breath on exertion   . Frequency of urination   . Urgency of urination   . Nocturia   . Bladder pain     Past Surgical History  Procedure Laterality Date  . Appendectomy    . Finger surgery  10-13-2009    left 5th finger  ARTHRODESIS  . Cysto/ hod/ instillation therapy  11-17-2008;   12-02-1999    INTERSTITIAL CYSTITIS  . Foot arthroplasty  05-03-2004    FIFTHE TOE RIGHT FOOT  . Bunionectomy  04-14-2003    LEFT FOOT  W/ POST REMOVAL INTERNAL FIXATION DEVICE  VIA SILVER BUNIONECTOMY  . Cholecystectomy  1998  . Abdominal hysterectomy      W/ BILATERAL SALPINGOOPHECTOMY  . Knee arthroscopy  JAN 2013    LEFT KNEE  . Cysto with hydrodistension  09/12/2011    Procedure: CYSTOSCOPY/HYDRODISTENSION;  Surgeon: Kathi Ludwig, MD;  Location: [facility];  Service: Urology;  Laterality: N/A;  INST[facility] M & P AND M & P    No family history on file.  History   Social History  . Marital Status: Married    Spouse Name: N/A    Number of Children: N/A  . Years of Education: N/A   Occupational History  . Not on file.   Social History Main Topics  . Smoking status: Never Smoker   . Smokeless tobacco: Never Used  . Alcohol Use: No  .  Drug Use: No  . Sexually Active: Yes    Birth Control/ Protection: Surgical   Other Topics Concern  . Not on file   Social History Narrative  . No narrative on file    Review of Systems:  All systems reviewed.  They are negative to the above problem except as previously stated.  Vital Signs: BP 117/79  Pulse 75  Ht 5\' 6"  (1.676 m)  Wt 148 lb (67.132 kg)  BMI 23.9 kg/m2  Physical Exam Patient is in NAD HEENT:  Normocephalic, atraumatic. EOMI, PERRLA.  Neck: JVP is normal.  No bruits.  Lungs: clear to auscultation. No rales no wheezes.  Heart: Regular rate and rhythm. Normal S1, S2. No S3.   No significant murmurs. PMI not displaced.  Abdomen:  Supple, nontender. Normal bowel sounds. No masses. No hepatomegaly.  Extremities:   Good distal pulses throughout. No lower extremity edema.  Musculoskeletal :moving all extremities.  Neuro:   alert and oriented x3.  CN II-XII grossly intact.   Assessment and Plan:  1.  HTN  Good control Keep on  amlodipine.  Has stopped clonidine  2.  Thigh pain.  Question back?  Question statin  Told her to hold crestor for 1 wk  Call with response  If no change resume.  Consider stretching/core exercises  3.  HL  On crestor  See above.  F/U in 1 year  Sooner with problems.

## 2012-05-21 LAB — HEMOGLOBIN A1C
Hgb A1c MFr Bld: 5.9 % — ABNORMAL HIGH (ref <5.7)
Mean Plasma Glucose: 123 mg/dL — ABNORMAL HIGH (ref <117)

## 2012-05-21 LAB — BASIC METABOLIC PANEL
CO2: 27 mEq/L (ref 19–32)
Calcium: 9 mg/dL (ref 8.4–10.5)
Chloride: 105 mEq/L (ref 96–112)
Creat: 0.85 mg/dL (ref 0.50–1.10)
Sodium: 135 mEq/L (ref 135–145)

## 2012-06-04 ENCOUNTER — Encounter: Payer: Self-pay | Admitting: Family Medicine

## 2012-06-04 ENCOUNTER — Ambulatory Visit (INDEPENDENT_AMBULATORY_CARE_PROVIDER_SITE_OTHER): Payer: BC Managed Care – PPO | Admitting: Family Medicine

## 2012-06-04 VITALS — BP 150/100 | HR 82 | Resp 16 | Ht 66.0 in | Wt 144.1 lb

## 2012-06-04 DIAGNOSIS — R630 Anorexia: Secondary | ICD-10-CM

## 2012-06-04 DIAGNOSIS — I10 Essential (primary) hypertension: Secondary | ICD-10-CM

## 2012-06-04 DIAGNOSIS — R7309 Other abnormal glucose: Secondary | ICD-10-CM

## 2012-06-04 DIAGNOSIS — R7303 Prediabetes: Secondary | ICD-10-CM

## 2012-06-04 DIAGNOSIS — E785 Hyperlipidemia, unspecified: Secondary | ICD-10-CM

## 2012-06-04 MED ORDER — MEGESTROL ACETATE 40 MG PO TABS
40.0000 mg | ORAL_TABLET | Freq: Every day | ORAL | Status: AC
Start: 2012-06-04 — End: 2012-08-04

## 2012-06-04 MED ORDER — AMLODIPINE BESYLATE 5 MG PO TABS: 5.0000 mg | ORAL_TABLET | Freq: Every day | ORAL | Status: DC

## 2012-06-04 NOTE — Progress Notes (Signed)
  Subjective:    Patient ID: Denise Werner, female    DOB: 11-22-1955, 57 y.o.   MRN: [id number]  HPI The PT is here for follow up and re-evaluation of chronic medical conditions, medication management and review of any available recent lab and radiology data.  Preventive health is updated, specifically  Cancer screening and Immunization.   Questions or concerns regarding consultations or procedures which the PT has had in the interim are  addressed. The PT denies any adverse reactions to current medications since the last visit.  Recently buried her ailing mother in law, dealing with this as best as she can, she was very involved and attatched , however is accepting of the fact that her Mom in law is no longer suffering. Reports excessive job stress and very poor apetite , which have progressively worsened in the past 3 to 4 month. Denies nausea, vomit or change in bowel movements, just has no appetite. Extremely stressed       Review of Systems See HPI Denies recent fever or chills. Denies sinus pressure, nasal congestion, ear pain or sore throat. Denies chest congestion, productive cough or wheezing. Denies chest pains, palpitations and leg swelling Denies abdominal pain, nausea, vomiting,diarrhea or constipation.   Denies dysuria, frequency, hesitancy or incontinence. Denies joint pain, swelling and limitation in mobility. Denies headaches, seizures, numbness, or tingling.  Denies skin break down or rash.        Objective:   Physical Exam Patient alert and oriented and in no cardiopulmonary distress.  HEENT: No facial asymmetry, EOMI, no sinus tenderness,  oropharynx pink and moist.  Neck supple no adenopathy.  Chest: Clear to auscultation bilaterally.  CVS: S1, S2 no murmurs, no S3.  ABD: Soft non tender. Bowel sounds normal.  Ext: No edema  MS: Adequate ROM spine, shoulders, hips and knees.  Skin: Intact, no ulcerations or rash noted.  Psych: Good eye  contact, normal affect. Memory intact  anxious not depressed appearing.  CNS: CN 2-12 intact, power, tone and sensation normal throughout.        Assessment & Plan:

## 2012-06-04 NOTE — Patient Instructions (Addendum)
F/u in 2 month   Fasting lipid , cmp, TSH , and vit D level in 2 month  Please work on reducing your stress level, by the way you handle challenging situations out of your control  Blood pressure is high, increase amlodipine to 5 mg daily start tonight, therefore ok to take TWO 2.5 mg tablets daily till done . You will get the new script to take to your pharmacy.   We will work on increasing food intake with medication and also plan to eat small portions often until you are able to increase quantitiy at any one sitting

## 2012-06-11 ENCOUNTER — Other Ambulatory Visit: Payer: Self-pay | Admitting: Internal Medicine

## 2012-06-13 ENCOUNTER — Other Ambulatory Visit: Payer: Self-pay | Admitting: Internal Medicine

## 2012-06-14 ENCOUNTER — Other Ambulatory Visit: Payer: Self-pay | Admitting: Family Medicine

## 2012-06-17 NOTE — Assessment & Plan Note (Signed)
Controlled, no change in medication updated la for next visit. Hyperlipidemia:Low fat diet discussed and encouraged.

## 2012-06-17 NOTE — Assessment & Plan Note (Signed)
Uncontrolled. Dose increase in medication DASH diet and commitment to daily physical activity for a minimum of 30 minutes discussed and encouraged, as a part of hypertension management. The importance of attaining a healthy weight is also discussed.

## 2012-06-17 NOTE — Assessment & Plan Note (Signed)
Excessive stress, which is reportedly work related, discussed stress management techniques. Short trial of megace also

## 2012-06-17 NOTE — Assessment & Plan Note (Signed)
Improved Patient educated about the importance of limiting  Carbohydrate intake , the need to commit to daily physical activity for a minimum of 30 minutes , and to commit weight loss. The fact that changes in all these areas will reduce or eliminate all together the development of diabetes is stressed.    

## 2012-06-18 ENCOUNTER — Encounter: Payer: Self-pay | Admitting: Family Medicine

## 2012-06-18 ENCOUNTER — Ambulatory Visit (INDEPENDENT_AMBULATORY_CARE_PROVIDER_SITE_OTHER): Payer: BC Managed Care – PPO | Admitting: Family Medicine

## 2012-06-18 VITALS — BP 120/80 | HR 82 | Temp 98.7°F | Resp 16 | Wt 145.0 lb

## 2012-06-18 DIAGNOSIS — J0100 Acute maxillary sinusitis, unspecified: Secondary | ICD-10-CM | POA: Insufficient documentation

## 2012-06-18 DIAGNOSIS — E785 Hyperlipidemia, unspecified: Secondary | ICD-10-CM

## 2012-06-18 DIAGNOSIS — R05 Cough: Secondary | ICD-10-CM | POA: Insufficient documentation

## 2012-06-18 DIAGNOSIS — J45991 Cough variant asthma: Secondary | ICD-10-CM

## 2012-06-18 DIAGNOSIS — M899 Disorder of bone, unspecified: Secondary | ICD-10-CM

## 2012-06-18 DIAGNOSIS — R630 Anorexia: Secondary | ICD-10-CM

## 2012-06-18 DIAGNOSIS — R7309 Other abnormal glucose: Secondary | ICD-10-CM

## 2012-06-18 DIAGNOSIS — M8580 Other specified disorders of bone density and structure, unspecified site: Secondary | ICD-10-CM

## 2012-06-18 DIAGNOSIS — R7303 Prediabetes: Secondary | ICD-10-CM

## 2012-06-18 DIAGNOSIS — R5381 Other malaise: Secondary | ICD-10-CM

## 2012-06-18 DIAGNOSIS — R5383 Other fatigue: Secondary | ICD-10-CM

## 2012-06-18 DIAGNOSIS — R058 Other specified cough: Secondary | ICD-10-CM | POA: Insufficient documentation

## 2012-06-18 DIAGNOSIS — I10 Essential (primary) hypertension: Secondary | ICD-10-CM

## 2012-06-18 DIAGNOSIS — R7301 Impaired fasting glucose: Secondary | ICD-10-CM

## 2012-06-18 MED ORDER — AZITHROMYCIN 250 MG PO TABS
ORAL_TABLET | ORAL | Status: AC
Start: 2012-06-18 — End: 2012-06-23

## 2012-06-18 MED ORDER — METHYLPREDNISOLONE ACETATE 80 MG/ML IJ SUSP
80.0000 mg | Freq: Once | INTRAMUSCULAR | Status: AC
  Administered 2012-06-18: 80 mg via INTRAMUSCULAR

## 2012-06-18 MED ORDER — PROMETHAZINE-DM 6.25-15 MG/5ML PO SYRP: ORAL_SOLUTION | ORAL | Status: DC

## 2012-06-18 MED ORDER — PREDNISONE 5 MG PO TABS: 5.0000 mg | ORAL_TABLET | Freq: Two times a day (BID) | ORAL | Status: DC

## 2012-06-18 MED ORDER — ALENDRONATE SODIUM 70 MG PO TABS: 70.0000 mg | ORAL_TABLET | ORAL | Status: DC

## 2012-06-18 NOTE — Assessment & Plan Note (Signed)
Prednisone for 5 days and deomedrol

## 2012-06-18 NOTE — Assessment & Plan Note (Signed)
Patient educated about the importance of limiting  Carbohydrate intake , the need to commit to daily physical activity for a minimum of 30 minutes , and to commit weight loss. The fact that changes in all these areas will reduce or eliminate all together the development of diabetes is stressed.   Updated lab in 4 month

## 2012-06-18 NOTE — Assessment & Plan Note (Signed)
Controlled, no change in medication DASH diet and commitment to daily physical activity for a minimum of 30 minutes discussed and encouraged, as a part of hypertension management. The importance of attaining a healthy weight is also discussed.  

## 2012-06-18 NOTE — Assessment & Plan Note (Signed)
Acute infection, z pack

## 2012-06-18 NOTE — Assessment & Plan Note (Signed)
Current flare with excessive cough, depo medrol in office and prednisone, also phenergan dm

## 2012-06-18 NOTE — Patient Instructions (Addendum)
F/u in 4.5 month, call if you need me before, cancel 2 month follow up  You are treated fos acute sinusitis and uncontrolled allergies with excessive cough.  Depo medrol  80 mg IM in office followed by prednisone, z pack and a cough suppressant for bedtime use.  Blood pressure is excellent  HBA1C , fasting lipid , cmp, ,in 4.5 month

## 2012-06-18 NOTE — Assessment & Plan Note (Signed)
Hyperlipidemia:Low fat diet discussed and encouraged.  Updated lab next visit 

## 2012-06-18 NOTE — Assessment & Plan Note (Signed)
Improved, not as stressed re work, taking a different approach

## 2012-06-18 NOTE — Progress Notes (Signed)
  Subjective:    Patient ID: Denise Werner, female    DOB: July 05, 1955, 57 y.o.   MRN: [id number]  HPI  4 day h/o maxillary sinus pressure which has worsened, persistent irritative cough , at times productive of green sputum. Denies documented, has had chills. Anxiety has improved, dealing with stress differently She is tolerating higher dose of amlodpine with no adverse side effects and blood pressure today is normal   Review of Systems See HPI  Denies chest pains, palpitations and leg swelling Denies abdominal pain, nausea, vomiting,diarrhea or constipation.   Denies dysuria, frequency, hesitancy or incontinence. Denies joint pain, swelling and limitation in mobility. Denies headaches, seizures, numbness, or tingling. Denies depression,uncontrolled  anxiety or insomnia. Denies skin break down or rash.        Objective:   Physical Exam  Patient alert and oriented and in no cardiopulmonary distress.Excess dry cough  HEENT: No facial asymmetry, EOMI, maxillary  sinus tenderness,  oropharynx pink and moist.  Neck supple no adenopathy.TM clear  Chest: Clear to auscultation bilaterally.no crackles or wheezes  CVS: S1, S2 no murmurs, no S3.  ABD: Soft non tender. Bowel sounds normal.  Ext: No edema  MS: Adequate ROM spine, shoulders, hips and knees.  Skin: Intact, no ulcerations or rash noted.  Psych: Good eye contact, normal affect. Memory intact not anxious or depressed appearing.  CNS: CN 2-12 intact, power, tone and sensation normal throughout.       Assessment & Plan:

## 2012-07-10 ENCOUNTER — Other Ambulatory Visit: Payer: Self-pay | Admitting: Family Medicine

## 2012-07-11 ENCOUNTER — Other Ambulatory Visit: Payer: Self-pay

## 2012-07-11 MED ORDER — ROSUVASTATIN CALCIUM 10 MG PO TABS: 10.0000 mg | ORAL_TABLET | ORAL | Status: DC

## 2012-07-11 NOTE — Telephone Encounter (Signed)
Dr. [name] patient

## 2012-07-23 ENCOUNTER — Telehealth: Payer: Self-pay

## 2012-07-23 MED ORDER — BENZONATATE 100 MG PO CAPS: 100.0000 mg | ORAL_CAPSULE | Freq: Three times a day (TID) | ORAL | Status: DC | PRN

## 2012-07-23 MED ORDER — PENICILLIN V POTASSIUM 500 MG PO TABS: 500.0000 mg | ORAL_TABLET | Freq: Three times a day (TID) | ORAL | Status: DC

## 2012-07-23 NOTE — Telephone Encounter (Signed)
T.O.  For Pen-K 500mg  tid #30 and Tessalon 100mg  tid prn #30.  Rx sent to pharmacy.  Patient made aware.

## 2012-07-30 NOTE — Telephone Encounter (Signed)
Pt called in my bsence re seemingly inadequately treated resp infection, I did advise re treatment with pen v

## 2012-08-17 ENCOUNTER — Other Ambulatory Visit: Payer: Self-pay

## 2012-08-17 ENCOUNTER — Telehealth: Payer: Self-pay

## 2012-08-17 DIAGNOSIS — J42 Unspecified chronic bronchitis: Secondary | ICD-10-CM

## 2012-08-17 NOTE — Telephone Encounter (Signed)
Has recently completed 2 rounds of antibiotics. I am concerned about possibility either of resistance , or overuse /unnecessary use of antibiotics, which ,if done will make them ineffective when she really does need them  She needs a CXR and sputum c/s  Also cbc and diff, these will help with distinguishing true recurrent infection with antibiotic resistance. Other possibility is sinus infection , no mention of such symptoms, I will attempt to spk directly with her, tried once , no response

## 2012-08-17 NOTE — Telephone Encounter (Signed)
Patient walked in office with c/o cough and congestion and bringing up yellow phlegm- started Monday. 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 Wants to know if she can have something sent in to [facility]- (zpak). Has been using OTC mucinex for a few days

## 2012-08-20 ENCOUNTER — Other Ambulatory Visit: Payer: Self-pay | Admitting: Family Medicine

## 2012-08-20 ENCOUNTER — Ambulatory Visit (HOSPITAL_COMMUNITY)
Admission: RE | Admit: 2012-08-20 | Discharge: 2012-08-20 | Disposition: A | Discharge status: Home / Self Care | Payer: BC Managed Care – PPO | Source: Ambulatory Visit | Attending: Family Medicine | Admitting: Family Medicine

## 2012-08-20 ENCOUNTER — Ambulatory Visit (INDEPENDENT_AMBULATORY_CARE_PROVIDER_SITE_OTHER): Payer: BC Managed Care – PPO | Admitting: Family Medicine

## 2012-08-20 ENCOUNTER — Encounter: Payer: Self-pay | Admitting: Family Medicine

## 2012-08-20 VITALS — BP 120/80 | HR 92 | Resp 18 | Ht 66.0 in | Wt 147.0 lb

## 2012-08-20 DIAGNOSIS — J42 Unspecified chronic bronchitis: Secondary | ICD-10-CM | POA: Insufficient documentation

## 2012-08-20 DIAGNOSIS — I10 Essential (primary) hypertension: Secondary | ICD-10-CM

## 2012-08-20 DIAGNOSIS — J329 Chronic sinusitis, unspecified: Secondary | ICD-10-CM

## 2012-08-20 DIAGNOSIS — R05 Cough: Secondary | ICD-10-CM

## 2012-08-20 DIAGNOSIS — R7303 Prediabetes: Secondary | ICD-10-CM

## 2012-08-20 DIAGNOSIS — R7309 Other abnormal glucose: Secondary | ICD-10-CM

## 2012-08-20 DIAGNOSIS — E785 Hyperlipidemia, unspecified: Secondary | ICD-10-CM

## 2012-08-20 LAB — LIPID PANEL
Cholesterol: 123 mg/dL (ref 0–200)
Triglycerides: 30 mg/dL (ref <150)

## 2012-08-20 LAB — COMPREHENSIVE METABOLIC PANEL
ALT: 15 U/L (ref 0–35)
Albumin: 4 g/dL (ref 3.5–5.2)
CO2: 27 mEq/L (ref 19–32)
Glucose, Bld: 92 mg/dL (ref 70–99)
Potassium: 4.4 mEq/L (ref 3.5–5.3)
Sodium: 140 mEq/L (ref 135–145)
Total Protein: 7 g/dL (ref 6.0–8.3)

## 2012-08-20 MED ORDER — PROMETHAZINE-DM 6.25-15 MG/5ML PO SYRP: ORAL_SOLUTION | ORAL | Status: AC

## 2012-08-20 MED ORDER — PREDNISONE 5 MG PO TABS: 5.0000 mg | ORAL_TABLET | Freq: Two times a day (BID) | ORAL | Status: AC

## 2012-08-20 MED ORDER — METHYLPREDNISOLONE ACETATE 80 MG/ML IJ SUSP
80.0000 mg | Freq: Once | INTRAMUSCULAR | Status: AC
  Administered 2012-08-20: 80 mg via INTRAMUSCULAR

## 2012-08-20 NOTE — Telephone Encounter (Signed)
I spoke directly with pt on 07/18 and explained the above. She was to get a CXR, but had car problems Denied fever or chills, stated she has an appt on 7/21. I advised she call back over weekend if condition deteriorated.She agreed. Denies sinus symptoms and states that she has been getting allergy shots per schedule

## 2012-08-20 NOTE — Patient Instructions (Addendum)
F/u in 4 months, with rectal  Depo medrol today, and prednisone sent to your pharmacy   Also phenergan dm for cough suppression  I will call re results and further management

## 2012-08-21 ENCOUNTER — Other Ambulatory Visit: Payer: Self-pay | Admitting: Family Medicine

## 2012-08-21 NOTE — Addendum Note (Signed)
Addended by: Abner Greenspan on: 08/21/2012 12:54 PM   Modules accepted: Orders

## 2012-08-23 ENCOUNTER — Other Ambulatory Visit: Payer: Self-pay | Admitting: Family Medicine

## 2012-08-23 ENCOUNTER — Telehealth: Payer: Self-pay | Admitting: Family Medicine

## 2012-08-23 DIAGNOSIS — J329 Chronic sinusitis, unspecified: Secondary | ICD-10-CM | POA: Insufficient documentation

## 2012-08-23 DIAGNOSIS — R05 Cough: Secondary | ICD-10-CM

## 2012-08-23 LAB — RESPIRATORY CULTURE OR RESPIRATORY AND SPUTUM CULTURE: Organism ID, Bacteria: NORMAL

## 2012-08-23 MED ORDER — CLINDAMYCIN HCL 300 MG PO CAPS: 300.0000 mg | ORAL_CAPSULE | Freq: Three times a day (TID) | ORAL | Status: AC

## 2012-08-23 NOTE — Progress Notes (Signed)
  Subjective:    Patient ID: Denise Werner, female    DOB: 02-Jan-1956, 57 y.o.   MRN: [id number]  HPI Pt in with c/o chronic cough uncontrolled nasal congestion with post nasal drip since May. She initially presented and was treated with a zpack, felt better initially and then within 2 weeks called in stating she had been feeling badly again with chills and malaise, also sputum which was thick and discolored. At that time she was treated wit a 10 day course of penicillin. Three days ago, pt walked in asking for an antibiotic due to feeling sick, coughing excessively and seeing green drainage from back of throat which she is also able to cough up. Excessive nasal congestion. Her sleep is disturbed , due excessive cough, she is fatigued. Head and chest hurt with excessive sough She is on immunotherapy for allergies, but states she notes that in recent times, many aerosol sprays make her cough. Otherwise no concerns/complaints   Review of Systems See HPI Denies chest pains, palpitations and leg swelling Denies abdominal pain, nausea, vomiting,diarrhea or constipation.   Denies dysuria, frequency, hesitancy or incontinence. Denies joint pain, swelling and limitation in mobility. Denies , seizures, numbness, or tingling. Denies depression, anxiety or insomnia. Denies skin break down or rash.        Objective:   Physical Exam  Patient alert and oriented and in no cardiopulmonary distress.  HEENT: No facial asymmetry, EOMI, no sinus tenderness,  oropharynx pink and moist.  Neck supple no adenopathy.Nasal congestion  Chest: Clear to auscultation bilaterally.  CVS: S1, S2 no murmurs, no S3.  ABD: Soft non tender. Bowel sounds normal.  Ext: No edema  MS: Adequate ROM spine, shoulders, hips and knees.  Skin: Intact, no ulcerations or rash noted.  Psych: Good eye contact, normal affect. Memory intact not anxious or depressed appearing.  CNS: CN 2-12 intact, power, tone and  sensation normal throughout.       Assessment & Plan:

## 2012-08-23 NOTE — Assessment & Plan Note (Signed)
Depo medrol administered at office and prednisone sent in

## 2012-08-23 NOTE — Assessment & Plan Note (Signed)
Worsening cough x 6 weeks with green sputum , will refer to pulmonary for assistance if sputum C/S is negative

## 2012-08-23 NOTE — Assessment & Plan Note (Signed)
Controlled, pt to wean off of crestor on completion of current Excellent lifestyle change has improved cholesterol and normalized blood sugar

## 2012-08-23 NOTE — Assessment & Plan Note (Signed)
Controlled, no change in medication DASH diet and commitment to daily physical activity for a minimum of 30 minutes discussed and encouraged, as a part of hypertension management. The importance of attaining a healthy weight is also discussed.  

## 2012-08-23 NOTE — Assessment & Plan Note (Signed)
With negative CXR and negative sputum culture, pt has persistent green post nasal drainage and cough, will order sinus scan and start cleocin for a 3 week course. Will also get input from pulmonary due to distressing symptoms

## 2012-08-23 NOTE — Assessment & Plan Note (Signed)
Corrected with lifestyle change only pt applauded on this

## 2012-08-24 NOTE — Telephone Encounter (Signed)
2nd insurance did not need a pre cert call ref # [id number]

## 2012-08-28 ENCOUNTER — Ambulatory Visit (HOSPITAL_COMMUNITY)
Admission: RE | Admit: 2012-08-28 | Discharge: 2012-08-28 | Disposition: A | Discharge status: Home / Self Care | Payer: BC Managed Care – PPO | Source: Ambulatory Visit | Attending: Family Medicine | Admitting: Family Medicine

## 2012-08-28 ENCOUNTER — Other Ambulatory Visit: Payer: Self-pay | Admitting: Family Medicine

## 2012-08-28 DIAGNOSIS — J328 Other chronic sinusitis: Secondary | ICD-10-CM | POA: Insufficient documentation

## 2012-08-28 DIAGNOSIS — R0982 Postnasal drip: Secondary | ICD-10-CM | POA: Insufficient documentation

## 2012-08-28 DIAGNOSIS — J329 Chronic sinusitis, unspecified: Secondary | ICD-10-CM

## 2012-08-29 ENCOUNTER — Other Ambulatory Visit: Payer: Self-pay | Admitting: Family Medicine

## 2012-08-29 ENCOUNTER — Telehealth: Payer: Self-pay | Admitting: Family Medicine

## 2012-08-29 DIAGNOSIS — J324 Chronic pansinusitis: Secondary | ICD-10-CM

## 2012-08-29 NOTE — Telephone Encounter (Signed)
thanks

## 2012-08-30 ENCOUNTER — Ambulatory Visit: Payer: BC Managed Care – PPO | Admitting: Internal Medicine

## 2012-10-21 ENCOUNTER — Telehealth: Payer: Self-pay | Admitting: Family Medicine

## 2012-10-21 NOTE — Telephone Encounter (Signed)
Pt c/o frequency and dysuria with tender right inguinal area x 2 days on 10/19/2012. Advised urine be taken to lab for c/s.No fever , chills or flank pain, has IC Written order left in lab on 10/20/2012 and pt aware

## 2012-10-22 ENCOUNTER — Other Ambulatory Visit: Payer: Self-pay | Admitting: Family Medicine

## 2012-10-23 LAB — URINE CULTURE: Colony Count: 70000

## 2012-10-25 ENCOUNTER — Other Ambulatory Visit: Payer: Self-pay

## 2012-10-25 DIAGNOSIS — Z1231 Encounter for screening mammogram for malignant neoplasm of breast: Secondary | ICD-10-CM

## 2012-11-12 ENCOUNTER — Ambulatory Visit
Admission: RE | Admit: 2012-11-12 | Discharge: 2012-11-12 | Disposition: A | Discharge status: Home / Self Care | Payer: BC Managed Care – PPO | Source: Ambulatory Visit

## 2012-11-12 DIAGNOSIS — Z1231 Encounter for screening mammogram for malignant neoplasm of breast: Secondary | ICD-10-CM

## 2012-11-24 ENCOUNTER — Telehealth: Payer: Self-pay | Admitting: Family Medicine

## 2012-11-24 DIAGNOSIS — R1013 Epigastric pain: Secondary | ICD-10-CM

## 2012-11-24 NOTE — Telephone Encounter (Signed)
C/o increased epigastric pain and discomfort for past several weeks. Believes her "hernia" is acting up. Requests that I specifically refer her to Dr Karilyn Cota who she has seen int the past. Most recent evaluation was by Dr Jena Gauss but she specifically asks to return to Dr Karilyn Cota, when she called his office for appt, she was told she would have to request referral since Dr Jena Gauss had seen her most recently. Will refer to Dr Karilyn Cota per specific request from patient

## 2012-12-06 ENCOUNTER — Other Ambulatory Visit: Payer: Self-pay

## 2012-12-06 ENCOUNTER — Encounter (INDEPENDENT_AMBULATORY_CARE_PROVIDER_SITE_OTHER): Payer: Self-pay | Admitting: *Deleted

## 2012-12-17 ENCOUNTER — Ambulatory Visit (INDEPENDENT_AMBULATORY_CARE_PROVIDER_SITE_OTHER): Payer: BC Managed Care – PPO | Admitting: Internal Medicine

## 2012-12-17 ENCOUNTER — Encounter (INDEPENDENT_AMBULATORY_CARE_PROVIDER_SITE_OTHER): Payer: Self-pay | Admitting: Internal Medicine

## 2012-12-17 VITALS — BP 116/74 | HR 68 | Temp 98.3°F | Ht 66.0 in | Wt 150.8 lb

## 2012-12-17 DIAGNOSIS — K219 Gastro-esophageal reflux disease without esophagitis: Secondary | ICD-10-CM

## 2012-12-17 NOTE — Progress Notes (Signed)
Subjective:     Patient ID: Denise Werner, female   DOB: [birth year], 56 y.o.   MRN: [id number]  HPI Presents with c/o of nausea. She has frequent nausea, but no vomiting. This has been going on for a couple of months.  She has frequent burping and has gas. Some acid reflux and takes Nexium on a prn basis. She sometimes becomes SOB. Appetite is good. No weight loss. No abdominal pain. She has a BM twice a day.  She occasionally takes a Tylenol PM.  No Goody Powders.  EGD 07/20/09 chronic nausea. Hx of interstitial cystitis. Dr. Darrick Penna: Normal esophagus without evidence of Barrett's, mass, erosion, ulceration or stricture. Diffuse erythema in the body and the antrum with occasional erosion. Nodular duodenal bulb.  Normal ampulla and second part of the duodenum.  (Patient's nausea is most likely multifactorial. She uses goody powder. Hx of GERD. Hx of interstitial cystitis.  Biopsy:   1. STOMACH, BIOPSY, : GASTRIC BODY AND ANTRAL MUCOSA WITH ASSOCIATED MINIMAL CHRONIC INFLAMMATION, NO EVIDENCE OF HELICOBACTER PYLORI, INTESTINAL METAPLASIA, DYSPLASIA OR MALIGNANCY IDENTIFIED. 2. DUODENUM, BIOPSY, NODULE : GASTRIC HETEROTOPIA, NO EVIDENCE OF HELICOBACTER PYLORI, DYSPLASIA OR MALIGNANCY IDENTIFIED.     Colonoscopy 2010: Hyperplastic polyp 10 yrs ago.  Normal terminal ileum, approximately for 10 cm visualized. Rare sigmoid colon diverticula. Other wise normal colon without evidence of polyps,masses, inflammatory changes, or AVMs. Normal retroflexed view of the rectum.   Review of Systems see hpi Current Outpatient Prescriptions  Medication Sig Dispense Refill  . amLODipine (NORVASC) 5 MG tablet Take 5 mg by mouth daily.      . Calcium Carbonate-Vit D-Min (CALCIUM 1200) 1200-1000 MG-UNIT CHEW Chew 1 tablet by mouth daily.       Marland Kitchen esomeprazole (NEXIUM) 20 MG capsule Take 20 mg by mouth as needed.      Marland Kitchen ketorolac (TORADOL) 10 MG tablet Take 10 mg by mouth every 6 (six) hours as needed.        . lansoprazole (PREVACID) 30 MG capsule Take 30 mg by mouth as needed. Take 30-60 min before first meal of the day      . mometasone-formoterol (DULERA) 100-5 MCG/ACT AERO Inhale 2 puffs into the lungs as needed.       No current facility-administered medications for this visit.   Past Medical History  Diagnosis Date  . Interstitial cystitis   . Chronic neck pain   . H/O hiatal hernia   . GERD (gastroesophageal reflux disease) OCCASIONAL  . Dyslipidemia   . Asthma   . Short of breath on exertion   . Frequency of urination   . Urgency of urination   . Nocturia   . Bladder pain    Married. Works as a Conservator, museum/gallery at [employer]. She has 2 daughters in good health.      Objective:   Physical Exam  Filed Vitals:   12/17/12 1417  BP: 116/74  Pulse: 68  Temp: 98.3 F (36.8 C)  Height: 5\' 6"  (1.676 m)  Weight: 150 lb 12.8 oz (68.402 kg)   Alert and oriented. Skin warm and dry. Oral mucosa is moist.   . Sclera anicteric, conjunctivae is pink. Thyroid not enlarged. No cervical lymphadenopathy. Lungs clear. Heart regular rate and rhythm.  Abdomen is soft. Bowel sounds are positive. No hepatomegaly. No abdominal masses felt. Slight epigastric tenderness. tenderness.  No edema to lower extremities.       Assessment:    Nausea, possible related to GERD.  Plan:    I am going to try her on Zegerid x 2 weeks and see how she does. If not better, will probably need an EGD.  Patient is agreeable with plan.

## 2012-12-17 NOTE — Patient Instructions (Addendum)
PR in 2 weeks. Zegerid samples x 3 weeks.

## 2012-12-18 DIAGNOSIS — K219 Gastro-esophageal reflux disease without esophagitis: Secondary | ICD-10-CM | POA: Insufficient documentation

## 2012-12-20 ENCOUNTER — Telehealth (INDEPENDENT_AMBULATORY_CARE_PROVIDER_SITE_OTHER): Payer: Self-pay | Admitting: *Deleted

## 2012-12-20 DIAGNOSIS — K219 Gastro-esophageal reflux disease without esophagitis: Secondary | ICD-10-CM

## 2012-12-20 MED ORDER — PANTOPRAZOLE SODIUM 40 MG PO TBEC: 40.0000 mg | DELAYED_RELEASE_TABLET | Freq: Every day | ORAL | Status: DC

## 2012-12-20 NOTE — Telephone Encounter (Signed)
Zegerid is not helping. Would like to know if Camelia Eng would call in the medicine she organically want her to try? The one you phone number is [phone].

## 2012-12-20 NOTE — Telephone Encounter (Signed)
Am going to try her on Protonix. She says the Zegerid is not helping. (She has been on for 3 days)

## 2013-03-18 ENCOUNTER — Other Ambulatory Visit: Payer: Self-pay | Admitting: Family Medicine

## 2013-03-18 ENCOUNTER — Telehealth (INDEPENDENT_AMBULATORY_CARE_PROVIDER_SITE_OTHER): Payer: Self-pay | Admitting: Internal Medicine

## 2013-03-18 DIAGNOSIS — K219 Gastro-esophageal reflux disease without esophagitis: Secondary | ICD-10-CM

## 2013-03-18 LAB — HEMOGLOBIN A1C
Hgb A1c MFr Bld: 5.9 % — ABNORMAL HIGH (ref <5.7)
Mean Plasma Glucose: 123 mg/dL — ABNORMAL HIGH (ref <117)

## 2013-03-18 LAB — CBC WITH DIFFERENTIAL/PLATELET
Basophils Absolute: 0 10*3/uL (ref 0.0–0.1)
Basophils Relative: 0 % (ref 0–1)
Eosinophils Absolute: 0.2 10*3/uL (ref 0.0–0.7)
Eosinophils Relative: 3 % (ref 0–5)
HCT: 36.6 % (ref 36.0–46.0)
Hemoglobin: 11.7 g/dL — ABNORMAL LOW (ref 12.0–15.0)
Lymphocytes Relative: 45 % (ref 12–46)
Lymphs Abs: 2.3 10*3/uL (ref 0.7–4.0)
MCH: 26.7 pg (ref 26.0–34.0)
MCHC: 32 g/dL (ref 30.0–36.0)
MCV: 83.6 fL (ref 78.0–100.0)
Monocytes Absolute: 0.5 10*3/uL (ref 0.1–1.0)
Monocytes Relative: 9 % (ref 3–12)
Neutro Abs: 2.2 10*3/uL (ref 1.7–7.7)
Neutrophils Relative %: 43 % (ref 43–77)
Platelets: 342 10*3/uL (ref 150–400)
RBC: 4.38 MIL/uL (ref 3.87–5.11)
RDW: 13.9 % (ref 11.5–15.5)
WBC: 5.2 10*3/uL (ref 4.0–10.5)

## 2013-03-18 LAB — LIPID PANEL
CHOLESTEROL: 166 mg/dL (ref 0–200)
HDL: 44 mg/dL (ref >39)
LDL Cholesterol: 113 mg/dL — ABNORMAL HIGH (ref 0–99)
TRIGLYCERIDES: 45 mg/dL (ref <150)
Total CHOL/HDL Ratio: 3.8 Ratio
VLDL: 9 mg/dL (ref 0–40)

## 2013-03-18 LAB — BASIC METABOLIC PANEL
BUN: 8 mg/dL (ref 6–23)
CO2: 27 mEq/L (ref 19–32)
Calcium: 8.9 mg/dL (ref 8.4–10.5)
Chloride: 106 mEq/L (ref 96–112)
Creat: 0.7 mg/dL (ref 0.50–1.10)
Glucose, Bld: 99 mg/dL (ref 70–99)
Potassium: 4.2 mEq/L (ref 3.5–5.3)
Sodium: 141 mEq/L (ref 135–145)

## 2013-03-18 LAB — TSH: TSH: 1.08 u[IU]/mL (ref 0.350–4.500)

## 2013-03-18 MED ORDER — OMEPRAZOLE-SODIUM BICARBONATE 40-1100 MG PO CAPS: 1.0000 | ORAL_CAPSULE | Freq: Every day | ORAL | Status: DC

## 2013-03-18 NOTE — Telephone Encounter (Signed)
Rx for Zegerid e presribed to her pharmacy.

## 2013-03-20 ENCOUNTER — Encounter: Payer: Self-pay | Admitting: Family Medicine

## 2013-03-20 ENCOUNTER — Other Ambulatory Visit: Payer: Self-pay | Admitting: Family Medicine

## 2013-03-20 ENCOUNTER — Telehealth (HOSPITAL_COMMUNITY): Payer: Self-pay | Admitting: Dietician

## 2013-03-20 LAB — IRON: Iron: 60 ug/dL (ref 42–145)

## 2013-03-20 LAB — FERRITIN: Ferritin: 107 ng/mL (ref 10–291)

## 2013-03-20 MED ORDER — ROSUVASTATIN CALCIUM 5 MG PO TABS: 5.0000 mg | ORAL_TABLET | Freq: Every day | ORAL | Status: DC

## 2013-03-20 NOTE — Telephone Encounter (Signed)
Received referral via Dr. Moshe Cipro for dx: prediabetes, HTN, hyperlipidemia.

## 2013-03-21 ENCOUNTER — Ambulatory Visit (INDEPENDENT_AMBULATORY_CARE_PROVIDER_SITE_OTHER): Payer: BC Managed Care – PPO | Admitting: Family Medicine

## 2013-03-21 ENCOUNTER — Encounter: Payer: Self-pay | Admitting: Family Medicine

## 2013-03-21 VITALS — BP 140/86 | HR 88 | Resp 18 | Ht 66.0 in | Wt 151.0 lb

## 2013-03-21 DIAGNOSIS — Z1211 Encounter for screening for malignant neoplasm of colon: Secondary | ICD-10-CM

## 2013-03-21 DIAGNOSIS — E785 Hyperlipidemia, unspecified: Secondary | ICD-10-CM

## 2013-03-21 DIAGNOSIS — R945 Abnormal results of liver function studies: Secondary | ICD-10-CM | POA: Insufficient documentation

## 2013-03-21 DIAGNOSIS — I10 Essential (primary) hypertension: Secondary | ICD-10-CM

## 2013-03-21 DIAGNOSIS — Z1212 Encounter for screening for malignant neoplasm of rectum: Secondary | ICD-10-CM

## 2013-03-21 DIAGNOSIS — J309 Allergic rhinitis, unspecified: Secondary | ICD-10-CM

## 2013-03-21 DIAGNOSIS — D649 Anemia, unspecified: Secondary | ICD-10-CM

## 2013-03-21 DIAGNOSIS — R7303 Prediabetes: Secondary | ICD-10-CM

## 2013-03-21 DIAGNOSIS — R7309 Other abnormal glucose: Secondary | ICD-10-CM

## 2013-03-21 DIAGNOSIS — R7989 Other specified abnormal findings of blood chemistry: Secondary | ICD-10-CM

## 2013-03-21 DIAGNOSIS — N3000 Acute cystitis without hematuria: Secondary | ICD-10-CM

## 2013-03-21 LAB — HEPATIC FUNCTION PANEL
ALBUMIN: 4 g/dL (ref 3.5–5.2)
ALK PHOS: 86 U/L (ref 39–117)
ALT: 65 U/L — ABNORMAL HIGH (ref 0–35)
AST: 45 U/L — ABNORMAL HIGH (ref 0–37)
BILIRUBIN TOTAL: 0.4 mg/dL (ref 0.2–1.2)
Bilirubin, Direct: 0.1 mg/dL (ref 0.0–0.3)
Indirect Bilirubin: 0.3 mg/dL (ref 0.2–1.2)
Total Protein: 6.6 g/dL (ref 6.0–8.3)

## 2013-03-21 LAB — POCT URINALYSIS DIPSTICK
BILIRUBIN UA: NEGATIVE
Glucose, UA: NEGATIVE
KETONES UA: NEGATIVE
LEUKOCYTES UA: NEGATIVE
Nitrite, UA: NEGATIVE
Protein, UA: NEGATIVE
RBC UA: NEGATIVE
Spec Grav, UA: 1.015
Urobilinogen, UA: 0.2
pH, UA: 5.5

## 2013-03-21 LAB — HEPATITIS PANEL, ACUTE
HCV Ab: NEGATIVE
HEP B C IGM: NONREACTIVE
Hep A IgM: NONREACTIVE
Hepatitis B Surface Ag: NEGATIVE

## 2013-03-21 LAB — VITAMIN D 25 HYDROXY (VIT D DEFICIENCY, FRACTURES): VIT D 25 HYDROXY: 38 ng/mL (ref 30–89)

## 2013-03-21 LAB — POC HEMOCCULT BLD/STL (OFFICE/1-CARD/DIAGNOSTIC): Fecal Occult Blood, POC: NEGATIVE

## 2013-03-21 NOTE — Patient Instructions (Addendum)
F/u in 4 month  You are referred to dr Laural Golden re elevated liver enzymes, also because of anemia and dysphagia at times You will also be scheduled for an [place] of your liver  Change diet to one of 50 to 60 % unprocessed food please, also commit to daily exercise , and low fat diet...you will do great , I know  Blood pressure slightly elevated today , no med change at this time, when you return after lifestyle changes this toow ill be better  NO CRESTOR   Start aspirin 81mg  one daily for stroke risk reduction  Please take calcium with D as well as the bone builder  One multivitamin once daily (slightly anemic)  Fasting cmp , hBA1C in 4 months, 3 to 5 days before f/u please

## 2013-03-22 ENCOUNTER — Ambulatory Visit (HOSPITAL_COMMUNITY): Payer: BC Managed Care – PPO

## 2013-03-22 ENCOUNTER — Ambulatory Visit (HOSPITAL_COMMUNITY)
Admission: RE | Admit: 2013-03-22 | Discharge: 2013-03-22 | Disposition: A | Discharge status: Home / Self Care | Payer: BC Managed Care – PPO | Source: Ambulatory Visit | Attending: Family Medicine | Admitting: Family Medicine

## 2013-03-22 ENCOUNTER — Encounter: Payer: Self-pay | Admitting: Family Medicine

## 2013-03-22 DIAGNOSIS — N3000 Acute cystitis without hematuria: Secondary | ICD-10-CM | POA: Insufficient documentation

## 2013-03-22 DIAGNOSIS — Z9089 Acquired absence of other organs: Secondary | ICD-10-CM | POA: Insufficient documentation

## 2013-03-22 DIAGNOSIS — R7989 Other specified abnormal findings of blood chemistry: Secondary | ICD-10-CM | POA: Diagnosis present

## 2013-03-22 DIAGNOSIS — D649 Anemia, unspecified: Secondary | ICD-10-CM | POA: Insufficient documentation

## 2013-03-22 DIAGNOSIS — R945 Abnormal results of liver function studies: Secondary | ICD-10-CM

## 2013-03-22 NOTE — Assessment & Plan Note (Signed)
Symptomatic x 2 days, normal UA, likely due to IC flare, pt reassured no sign of infection

## 2013-03-22 NOTE — Assessment & Plan Note (Signed)
Sub optimal control, not at goal DASH diet and commitment to daily physical activity for a minimum of 30 minutes discussed and encouraged, as a part of hypertension management. The importance of attaining a healthy weight is also discussed.

## 2013-03-22 NOTE — Progress Notes (Signed)
Subjective:    Patient ID: Denise Werner, female    DOB: 04/06/1955, 58 y.o.   MRN: [id number]  HPI The PT is here for follow up and re-evaluation of chronic medical conditions, medication management and review of any available recent lab and radiology data.  Preventive health is updated, specifically  Cancer screening and Immunization.   Currently under surveillance for glaucoma, she is a glaucoma suspect. The PT denies any adverse reactions to current medications since the last visit.  Reports improvement in her sleep. Has occasional headaches, uses BC powder for this, less often than one monthly. Notes mild dysuria and frequency in the past 2 days, denies nausea , fever or chills, has chronic right flank/RUQ pain. States that overall her IC is doing fairly well and takes no medication for this on a daily basis.  Continues to need PPI for GERd symptoms, occasional dysphagia , esp with fruit. States she had not been exercising or as diligent with diet in recent times as she should be , but highly motivated to changing this, wants to live to see  Her grandchild due in next 4 months!.       Review of Systems See HPI Denies recent fever or chills. Denies sinus pressure, nasal congestion, ear pain or sore throat. Denies chest congestion, productive cough or wheezing. Denies chest pains, palpitations and leg swelling Denies abdominal pain, nausea, vomiting,diarrhea or constipation.  No change in BM, no melena or hematemesis  Denies joint pain, swelling and limitation in mobility. Denies  seizures, numbness, or tingling. Denies depression, anxiety or insomnia. Denies skin break down or rash.        Objective:   Physical Exam  BP 140/86  Pulse 88  Resp 18  Ht 5\' 6"  (1.676 m)  Wt 151 lb 0.6 oz (68.511 kg)  BMI 24.39 kg/m2  SpO2 100% Patient alert and oriented and in no cardiopulmonary distress.  HEENT: No facial asymmetry, EOMI, no sinus tenderness,  oropharynx pink and  moist.  Neck supple no adenopathy.  Chest: Clear to auscultation bilaterally.  CVS: S1, S2 no murmurs, no S3.  ABD: Soft non tender. No organomegaly or masses palpated.Bowel sounds normal.No renal angle tenderness Rectal: no mass, heme negative stool  Ext: No edema  MS: Adequate ROM spine, shoulders, hips and knees.  Skin: Intact, no ulcerations or rash noted.  Psych: Good eye contact, normal affect. Memory intact not anxious or depressed appearing.  CNS: CN 2-12 intact, power, tone and sensation normal throughout.       Assessment & Plan:  HTN (hypertension) Sub optimal control, not at goal DASH diet and commitment to daily physical activity for a minimum of 30 minutes discussed and encouraged, as a part of hypertension management. The importance of attaining a healthy weight is also discussed.   Prediabetes Deteriorated. Patient educated about the importance of limiting  Carbohydrate intake , the need to commit to daily physical activity for a minimum of 30 minutes , and to commit weight loss. The fact that changes in all these areas will reduce or eliminate all together the development of diabetes is stressed.     HYPERLIPIDEMIA Elevated LDL , and has been off statin for over 4 5 monhts. Hyperlipidemia:Low fat diet discussed and encouraged.  Will not restart statin, low risk of cAD based on personal and family history, also new is elevated AST and ALT, needs to be investigate. Pt aware Updated lab needed at/ before next visit.   Abnormal LFTs Hepatitis  panel and [place] of liver both normal. Pt on no hepatotoxic medication . Refer to GI for further evaluation  ALLERGIC RHINITIS CAUSE UNSPECIFIED Continuing to do very well with mmunotherapy  Anemia Pt not iron deficient, mildly anemic , GI to eval, mutivitamin, one daily recommended  Acute cystitis Symptomatic x 2 days, normal UA, likely due to IC flare, pt reassured no sign of infection

## 2013-03-22 NOTE — Assessment & Plan Note (Signed)
Hepatitis panel and [place] of liver both normal. Pt on no hepatotoxic medication . Refer to GI for further evaluation

## 2013-03-22 NOTE — Assessment & Plan Note (Signed)
Continuing to do very well with mmunotherapy

## 2013-03-22 NOTE — Assessment & Plan Note (Signed)
Pt not iron deficient, mildly anemic , GI to eval, mutivitamin, one daily recommended

## 2013-03-22 NOTE — Assessment & Plan Note (Signed)
Deteriorated Patient educated about the importance of limiting  Carbohydrate intake , the need to commit to daily physical activity for a minimum of 30 minutes , and to commit weight loss. The fact that changes in all these areas will reduce or eliminate all together the development of diabetes is stressed.    

## 2013-03-22 NOTE — Assessment & Plan Note (Addendum)
Elevated LDL , and has been off statin for over 4 5 monhts. Hyperlipidemia:Low fat diet discussed and encouraged.  Will not restart statin, low risk of cAD based on personal and family history, also new is elevated AST and ALT, needs to be investigate. Pt aware Updated lab needed at/ before next visit.

## 2013-03-29 ENCOUNTER — Telehealth: Payer: Self-pay | Admitting: Family Medicine

## 2013-03-29 DIAGNOSIS — R1013 Epigastric pain: Secondary | ICD-10-CM

## 2013-03-29 NOTE — Telephone Encounter (Signed)
Pls order H pylori blood test dx dyspesia, chronic nausea, fax to lab  On [facility] Plan dr , pls make sure the lab is open, she will go today

## 2013-03-29 NOTE — Telephone Encounter (Signed)
Lab order faxed and they are open

## 2013-03-29 NOTE — Addendum Note (Signed)
Addended by: Eual Fines on: 03/29/2013 01:00 PM   Modules accepted: Orders

## 2013-04-01 ENCOUNTER — Encounter: Payer: Self-pay | Admitting: Family Medicine

## 2013-04-01 LAB — H. PYLORI ANTIBODY, IGG: H Pylori IgG: <0.4 {ISR}

## 2013-04-02 ENCOUNTER — Encounter (INDEPENDENT_AMBULATORY_CARE_PROVIDER_SITE_OTHER): Payer: Self-pay | Admitting: Internal Medicine

## 2013-04-02 ENCOUNTER — Ambulatory Visit (INDEPENDENT_AMBULATORY_CARE_PROVIDER_SITE_OTHER): Payer: BC Managed Care – PPO | Admitting: Internal Medicine

## 2013-04-02 ENCOUNTER — Encounter (INDEPENDENT_AMBULATORY_CARE_PROVIDER_SITE_OTHER): Payer: Self-pay | Admitting: *Deleted

## 2013-04-02 ENCOUNTER — Other Ambulatory Visit (INDEPENDENT_AMBULATORY_CARE_PROVIDER_SITE_OTHER): Payer: Self-pay | Admitting: Internal Medicine

## 2013-04-02 ENCOUNTER — Other Ambulatory Visit (INDEPENDENT_AMBULATORY_CARE_PROVIDER_SITE_OTHER): Payer: Self-pay | Admitting: *Deleted

## 2013-04-02 VITALS — BP 112/74 | HR 72 | Temp 98.2°F | Ht 66.5 in | Wt 149.3 lb

## 2013-04-02 DIAGNOSIS — R1013 Epigastric pain: Principal | ICD-10-CM

## 2013-04-02 DIAGNOSIS — R748 Abnormal levels of other serum enzymes: Secondary | ICD-10-CM

## 2013-04-02 DIAGNOSIS — R1011 Right upper quadrant pain: Secondary | ICD-10-CM

## 2013-04-02 DIAGNOSIS — G8929 Other chronic pain: Secondary | ICD-10-CM

## 2013-04-02 NOTE — Progress Notes (Signed)
Subjective:     Patient ID: Denise Werner, female   DOB: 04-12-1955, 58 y.o.   MRN: [id number]  HPI Referred to our office by Dr. Moshe Cipro for elevated liver enzymes. Denies prior hx of elevated liver enzymes.  She tells me she has pain in her rt upper quadrant for a couple of months. She says she always feels nauseated. Hx of chronic nausea and underwent an EGD in 2010 by Dr. Oneida Alar (see below) Her appetite is good. No weight loss. The pain is not associated with eating. She says it is a dull, constant pain that does not go away. She has a BM daily. No melena or bright red rectal bleeding.  03/18/2013 HepB surface negative, Hep B IgM negative, Hep C AB negative, Hepa A IgM negative. Iron 60, Ferritin 107.  Hepatic Function Panel     Component Value Date/Time   PROT 6.6 03/18/2013 0927   ALBUMIN 4.0 03/18/2013 0927   AST 45* 03/18/2013 0927   ALT 65* 03/18/2013 0927   ALKPHOS 86 03/18/2013 0927   BILITOT 0.4 03/18/2013 0927   BILIDIR 0.1 03/18/2013 0927   IBILI 0.3 03/18/2013 0927   03/21/2013 [place] RUQ: Liver:  The liver has a normal echogenic pattern. No focal abnormality is  seen. The liver measures 13.9 cm sagittally.  IMPRESSION:  1. No hepatic abnormality is seen.  2. Prior cholecystectomy.  CBC    Component Value Date/Time   WBC 5.2 03/18/2013 0927   RBC 4.38 03/18/2013 0927   HGB 11.7* 03/18/2013 0927   HCT 36.6 03/18/2013 0927   PLT 342 03/18/2013 0927   MCV 83.6 03/18/2013 0927   MCH 26.7 03/18/2013 0927   MCHC 32.0 03/18/2013 0927   RDW 13.9 03/18/2013 0927   LYMPHSABS 2.3 03/18/2013 0927   MONOABS 0.5 03/18/2013 0927   EOSABS 0.2 03/18/2013 0927   BASOSABS 0.0 03/18/2013 0927   EGD 07/20/09 chronic nausea. Hx of interstitial cystitis. Dr. Oneida Alar:  Normal esophagus without evidence of Barrett's, mass, erosion, ulceration or stricture. Diffuse erythema in the body and the antrum with occasional erosion. Nodular duodenal bulb. Normal ampulla and second part of the duodenum.  (Patient's nausea is most likely multifactorial. She uses goody powder. Hx of GERD. Hx of interstitial cystitis.  Biopsy:   1. STOMACH, BIOPSY, : GASTRIC BODY AND ANTRAL MUCOSA WITH ASSOCIATED MINIMAL CHRONIC INFLAMMATION, NO EVIDENCE OF HELICOBACTER PYLORI, INTESTINAL METAPLASIA, DYSPLASIA OR MALIGNANCY IDENTIFIED. 2. DUODENUM, BIOPSY, NODULE : GASTRIC HETEROTOPIA, NO EVIDENCE OF HELICOBACTER PYLORI, DYSPLASIA OR MALIGNANCY IDENTIFIED.  Colonoscopy 2010: Hyperplastic polyp 10 yrs ago.  Normal terminal ileum, approximately for 10 cm visualized. Rare sigmoid colon diverticula. Other wise normal colon without evidence of polyps,masses, inflammatory changes, or AVMs. Normal retroflexed view of the rectum.      Review of Systems    Past Medical History  Diagnosis Date  . Interstitial cystitis   . Chronic neck pain   . H/O hiatal hernia   . GERD (gastroesophageal reflux disease) OCCASIONAL  . Dyslipidemia   . Asthma   . Short of breath on exertion   . Frequency of urination   . Urgency of urination   . Nocturia   . Bladder pain     Past Surgical History  Procedure Laterality Date  . Appendectomy    . Finger surgery  10-13-2009    left 5th finger ARTHRODESIS  . Cysto/ hod/ instillation therapy  11-17-2008;   12-02-1999    INTERSTITIAL CYSTITIS  . Foot arthroplasty  05-03-2004  FIFTHE TOE RIGHT FOOT  . Bunionectomy  04-14-2003    LEFT FOOT  W/ POST REMOVAL INTERNAL FIXATION DEVICE  VIA SILVER BUNIONECTOMY  . Cholecystectomy  1998  . Abdominal hysterectomy      W/ BILATERAL SALPINGOOPHECTOMY  . Knee arthroscopy  JAN 2013    LEFT KNEE  . Cysto with hydrodistension  09/12/2011    Procedure: CYSTOSCOPY/HYDRODISTENSION;  Surgeon: Ailene Rud, MD;  Location: [facility];  Service: Urology;  Laterality: N/A;  [facility]    Allergies  Allergen Reactions  . Latex Rash  . Hydrocodone Nausea And Vomiting  . Sulfonamide Derivatives      Current Outpatient Prescriptions on File Prior to Visit  Medication Sig Dispense Refill  . amLODipine (NORVASC) 5 MG tablet Take 5 mg by mouth daily.      . Calcium Carbonate-Vit D-Min (CALCIUM 1200) 1200-1000 MG-UNIT CHEW Chew 1 tablet by mouth daily.       . mometasone-formoterol (DULERA) 100-5 MCG/ACT AERO Inhale 2 puffs into the lungs as needed.       No current facility-administered medications on file prior to visit.      Objective:   Physical Exam  Filed Vitals:   04/02/13 1426  BP: 112/74  Pulse: 72  Temp: 98.2 F (36.8 C)  Height: 5' 6.5" (1.689 m)  Weight: 149 lb 4.8 oz (67.722 kg)   Alert and oriented. Skin warm and dry. Oral mucosa is moist.   . Sclera anicteric, conjunctivae is pink. Thyroid not enlarged. No cervical lymphadenopathy. Lungs clear. Heart regular rate and rhythm.  Abdomen is soft. Bowel sounds are positive. No hepatomegaly. No abdominal masses felt. Tenderness RUQ and epigastric region.   No edema to lower extremities.       Assessment:     Epigastric and rt upper quadrant tenderness. Hx of cholecystectomy years ago.  PUD needs to be ruled out. Liver enzymes are slightly elevated. Auto immune process needs to be ruled out.     Plan:     EGD with DR. Rehman ANA, SMA,  alpha 1 antitrypsin, sedrate, Cmet   Further recommendations to follow.  Protonix 40mg  po daily 30 minutes before breakfast.

## 2013-04-02 NOTE — Patient Instructions (Addendum)
Labs, Protonix 40mg  30 minutes before breakfast. EGD with Dr Laural Golden

## 2013-04-03 LAB — COMPREHENSIVE METABOLIC PANEL
ALT: 19 U/L (ref 0–35)
AST: 19 U/L (ref 0–37)
Albumin: 4.6 g/dL (ref 3.5–5.2)
Alkaline Phosphatase: 81 U/L (ref 39–117)
BILIRUBIN TOTAL: 0.4 mg/dL (ref 0.2–1.2)
BUN: 13 mg/dL (ref 6–23)
CO2: 29 meq/L (ref 19–32)
Calcium: 9.4 mg/dL (ref 8.4–10.5)
Chloride: 100 mEq/L (ref 96–112)
Creat: 0.8 mg/dL (ref 0.50–1.10)
Glucose, Bld: 80 mg/dL (ref 70–99)
Potassium: 4.2 mEq/L (ref 3.5–5.3)
SODIUM: 139 meq/L (ref 135–145)
TOTAL PROTEIN: 7.2 g/dL (ref 6.0–8.3)

## 2013-04-03 LAB — ANTI-SMOOTH MUSCLE ANTIBODY, IGG: Smooth Muscle Ab: 6 U (ref <20)

## 2013-04-03 LAB — ANA: ANA: NEGATIVE

## 2013-04-03 LAB — ALPHA-1-ANTITRYPSIN: A-1 Antitrypsin, Ser: 119 mg/dL (ref 90–200)

## 2013-04-03 LAB — SEDIMENTATION RATE: Sed Rate: 12 mm/hr (ref 0–22)

## 2013-04-03 NOTE — Telephone Encounter (Signed)
Pt has not responded to attempts to contact to schedule appointment. Referral filed.  

## 2013-05-01 ENCOUNTER — Encounter (HOSPITAL_COMMUNITY)
Admission: RE | Disposition: A | Discharge status: Home / Self Care | Payer: Self-pay | Source: Ambulatory Visit | Attending: Internal Medicine

## 2013-05-01 ENCOUNTER — Ambulatory Visit (HOSPITAL_COMMUNITY)
Admission: RE | Admit: 2013-05-01 | Discharge: 2013-05-01 | Disposition: A | Discharge status: Home / Self Care | Payer: BC Managed Care – PPO | Source: Ambulatory Visit | Attending: Internal Medicine | Admitting: Internal Medicine

## 2013-05-01 ENCOUNTER — Encounter (HOSPITAL_COMMUNITY): Payer: Self-pay | Admitting: *Deleted

## 2013-05-01 DIAGNOSIS — K449 Diaphragmatic hernia without obstruction or gangrene: Secondary | ICD-10-CM | POA: Diagnosis not present

## 2013-05-01 DIAGNOSIS — R1013 Epigastric pain: Secondary | ICD-10-CM | POA: Insufficient documentation

## 2013-05-01 DIAGNOSIS — Q391 Atresia of esophagus with tracheo-esophageal fistula: Secondary | ICD-10-CM | POA: Diagnosis not present

## 2013-05-01 DIAGNOSIS — D131 Benign neoplasm of stomach: Secondary | ICD-10-CM

## 2013-05-01 DIAGNOSIS — K2100 Gastro-esophageal reflux disease with esophagitis, without bleeding: Secondary | ICD-10-CM | POA: Diagnosis not present

## 2013-05-01 DIAGNOSIS — R1310 Dysphagia, unspecified: Secondary | ICD-10-CM | POA: Insufficient documentation

## 2013-05-01 DIAGNOSIS — G8929 Other chronic pain: Secondary | ICD-10-CM

## 2013-05-01 DIAGNOSIS — R1011 Right upper quadrant pain: Secondary | ICD-10-CM | POA: Diagnosis present

## 2013-05-01 DIAGNOSIS — K222 Esophageal obstruction: Secondary | ICD-10-CM

## 2013-05-01 DIAGNOSIS — Q393 Congenital stenosis and stricture of esophagus: Secondary | ICD-10-CM | POA: Diagnosis not present

## 2013-05-01 HISTORY — PX: ESOPHAGOGASTRODUODENOSCOPY: SHX5428

## 2013-05-01 SURGERY — EGD (ESOPHAGOGASTRODUODENOSCOPY)
Anesthesia: Moderate Sedation

## 2013-05-01 MED ORDER — MIDAZOLAM HCL 5 MG/5ML IJ SOLN
INTRAMUSCULAR | Status: DC | PRN
  Administered 2013-05-01 (×2): 2 mg via INTRAVENOUS
  Administered 2013-05-01: 1 mg via INTRAVENOUS
  Administered 2013-05-01: 3 mg via INTRAVENOUS
  Administered 2013-05-01: 2 mg via INTRAVENOUS

## 2013-05-01 MED ORDER — PANTOPRAZOLE SODIUM 40 MG PO TBEC: 40.0000 mg | DELAYED_RELEASE_TABLET | Freq: Every day | ORAL | Status: DC

## 2013-05-01 MED ORDER — MEPERIDINE HCL 50 MG/ML IJ SOLN
INTRAMUSCULAR | Status: DC | PRN
  Administered 2013-05-01 (×2): 25 mg via INTRAVENOUS

## 2013-05-01 MED ORDER — MEPERIDINE HCL 50 MG/ML IJ SOLN
INTRAMUSCULAR | Status: AC
  Filled 2013-05-01: qty 1

## 2013-05-01 MED ORDER — SODIUM CHLORIDE 0.9 % IV SOLN
INTRAVENOUS | Status: DC
  Administered 2013-05-01: 13:00:00 via INTRAVENOUS

## 2013-05-01 MED ORDER — BUTAMBEN-TETRACAINE-BENZOCAINE 2-2-14 % EX AERO
INHALATION_SPRAY | CUTANEOUS | Status: DC | PRN
  Administered 2013-05-01: 1 via TOPICAL
  Administered 2013-05-01: 2 via TOPICAL

## 2013-05-01 MED ORDER — STERILE WATER FOR IRRIGATION IR SOLN
Status: DC | PRN
  Administered 2013-05-01: 13:00:00

## 2013-05-01 MED ORDER — MIDAZOLAM HCL 5 MG/5ML IJ SOLN
INTRAMUSCULAR | Status: AC
  Filled 2013-05-01: qty 10

## 2013-05-01 NOTE — H&P (Signed)
Denise Werner is an 58 y.o. female.   Chief Complaint: Patient is here for EGD and ED. HPI: Patient is 58 year old female who presents with intermittent epigastric and right upper quadrant pain since November 2014. Pain may last for hours or holding associated with nausea. She describes this pain as if something is. She was noted to have mildly elevated LFTs in February 20/15 and underwent upper abdominal ultrasound which was within normal limits. She has had gallbladder removed. Her CBD measured 5.1 mm. She also complains of heartburn and has had intermittent dysphagia to solids particularly to meats. She points to supraciliary [place] bolus obstruction. Regarding her elevated transaminases she also had multiple biochemical studies and they're all negative. Her transaminases have returned to normal. Patient states that her heartburn has improved with pantoprazole the she's been on since November 2014.  Past Medical History  Diagnosis Date  . Interstitial cystitis   . Chronic neck pain   . H/O hiatal hernia   . GERD (gastroesophageal reflux disease) OCCASIONAL  . Dyslipidemia   . Asthma   . Short of breath on exertion   . Frequency of urination   . Urgency of urination   . Nocturia   . Bladder pain     Past Surgical History  Procedure Laterality Date  . Appendectomy    . Finger surgery  10-13-2009    left 5th finger ARTHRODESIS  . Cysto/ hod/ instillation therapy  11-17-2008;   12-02-1999    INTERSTITIAL CYSTITIS  . Foot arthroplasty  05-03-2004    FIFTHE TOE RIGHT FOOT  . Bunionectomy  04-14-2003    LEFT FOOT  W/ POST REMOVAL INTERNAL FIXATION DEVICE  VIA SILVER BUNIONECTOMY  . Cholecystectomy  1998  . Abdominal hysterectomy      W/ BILATERAL SALPINGOOPHECTOMY  . Knee arthroscopy  JAN 2013    LEFT KNEE  . Cysto with hydrodistension  09/12/2011    Procedure: CYSTOSCOPY/HYDRODISTENSION;  Surgeon: Ailene Rud, MD;  Location: [facility];  Service:  Urology;  Laterality: N/A;  INSTILL M & P AND M & P    History reviewed. No pertinent family history. Social History:  reports that she has never smoked. She has never used smokeless tobacco. She reports that she does not drink alcohol or use illicit drugs.  Allergies:  Allergies  Allergen Reactions  . Latex Rash  . Hydrocodone Nausea And Vomiting  . Sulfonamide Derivatives     Medications Prior to Admission  Medication Sig Dispense Refill  . amLODipine (NORVASC) 5 MG tablet Take 5 mg by mouth daily.      . Calcium Carbonate-Vit D-Min (CALCIUM 1200) 1200-1000 MG-UNIT CHEW Chew 1 tablet by mouth daily.       . mometasone-formoterol (DULERA) 100-5 MCG/ACT AERO Inhale 2 puffs into the lungs as needed.      . pentosan polysulfate (ELMIRON) 100 MG capsule Take 100 mg by mouth as needed.      . traMADol (ULTRAM) 50 MG tablet Take by mouth every 6 (six) hours as needed.        No results found for this or any previous visit (from the past 48 hour(s)). No results found.  ROS  Blood pressure 151/87, pulse 88, temperature 98.1 F (36.7 C), temperature source Oral, resp. rate 18, height 5' 6.5" (1.689 m), weight 149 lb (67.586 kg), SpO2 98.00%. Physical Exam  Constitutional: She appears well-developed and well-nourished.  HENT:  Mouth/Throat: Oropharynx is clear and moist.  Eyes: Conjunctivae are normal. No  scleral icterus.  Neck: No thyromegaly present.  Cardiovascular: Normal rate, regular rhythm and normal heart sounds.   No murmur heard. Respiratory: Effort normal and breath sounds normal.  GI: Soft. She exhibits no distension and no mass. There is no tenderness.  Musculoskeletal: She exhibits no edema.  Lymphadenopathy:    She has no cervical adenopathy.  Neurological: She is alert.  Skin: Skin is warm and dry.     Assessment/Plan Recurrent epigastric/right upper quadrant abdominal pain. Dysphagia. EGD and ED.  REHMAN,NAJEEB U 05/01/2013, 1:13 PM

## 2013-05-01 NOTE — Discharge Instructions (Signed)
Resume usual medications and diet. No driving for 24 hours. [place] if you have another episode of epigastric and right upper quadrant pain in which case will need to do LFTs(blood test).   Esophagogastroduodenoscopy Care After Refer to this sheet in the next few weeks. These instructions provide you with information on caring for yourself after your procedure. Your caregiver may also give you more specific instructions. Your treatment has been planned according to current medical practices, but problems sometimes occur. Call your caregiver if you have any problems or questions after your procedure.  HOME CARE INSTRUCTIONS  Do not eat or drink anything until the numbing medicine (local anesthetic) has worn off and your gag reflex has returned. You will know that the local anesthetic has worn off when you can swallow comfortably.  Do not drive for 12 hours after the procedure or as directed by your caregiver.  Only take medicines as directed by your caregiver. SEEK MEDICAL CARE IF:   You cannot stop coughing.  You are not urinating at all or less than usual. SEEK IMMEDIATE MEDICAL CARE IF:  You have difficulty swallowing.  You cannot eat or drink.  You have worsening throat or chest pain.  You have dizziness, lightheadedness, or you faint.  You have nausea or vomiting.  You have chills.  You have a fever.  You have severe abdominal pain.  You have black, tarry, or bloody stools. Document Released: 01/04/2012 Document Reviewed: 01/04/2012 [facility] Patient Information 2014 [place], [place].

## 2013-05-01 NOTE — Op Note (Signed)
EGD PROCEDURE REPORT  PATIENT:  Denise Werner  MR#:  [id number] Birthdate:  30-Aug-1955, 58 y.o., female Endoscopist:  Dr. Rogene Houston, MD Referred By:  Dr. Tula Nakayama, MD  Procedure Date: 05/01/2013  Procedure:   EGD with ED.  Indications:  Patient is 58 year old African female who presents with recurrent epigastric/right upper quadrant pain. She also complains of intermittent solid food dysphagia. She is on pantoprazole but control of heartburn. She has had gallbladder removed about 10 years ago. H. pylori serology is negative.            Informed Consent:  The risks, benefits, alternatives & imponderables which include, but are not limited to, bleeding, infection, perforation, drug reaction and potential missed lesion have been reviewed.  The potential for biopsy, lesion removal, esophageal dilation, etc. have also been discussed.  Questions have been answered.  All parties agreeable.  Please see history & physical in medical record for more information.  Medications:  Demerol 50 mg IV Versed 10 mg IV Cetacaine spray topically for oropharyngeal anesthesia  Description of procedure:  The endoscope was introduced through the mouth and advanced to the second portion of the duodenum without difficulty or limitations. The mucosal surfaces were surveyed very carefully during advancement of the scope and upon withdrawal.  Findings:  Esophagus:  Mucosa of the esophagus was normal. Focal erythema and edema noted the GE junction without ring or stricture formation. GEJ:  35 cm Hiatus:  37 cm Stomach:  Stomach was empty and distended very well with insufflation. Folds in the proximal stomach were normal. Examination of mucosa at gastric body revealed  few hyperplastic-appearing polyps. Few small polyps were also noted at gastric fundus. Antral mucosa, pyloric channel and angularis was unremarkable. Duodenum:  Normal bulbar and post bulbar mucosa. Ampulla of Vater was also normal.  normal.  Therapeutic/Diagnostic Maneuvers Performed:   Esophagus was dilated by passing 56 [place] Maloney dilator insertion. As the dilators withdrawn endoscope was passed again to reexamine esophageal mucosa and superficial linear tear noted at proximal esophagus indicative of a web.  Complications:  None  Impression: Mild changes of reflux esophagitis limited to GE junction. Small sliding hiatal hernia. Few small hyperplastic polyps at gastric body and fundus. No evidence of peptic ulcer disease or gastritis. Esophageal dilation resulted in small mucosal disruption at cervical esophagus indicative of a web.  Recommendations:  Continue anti-reflux measures and pantoprazole as before. Will recheck LFTs if she has another episode of epigastric and right upper quadrant abdominal pain.  REHMAN,NAJEEB U  05/01/2013  1:38 PM  CC: Dr. Tula Nakayama, MD & Dr. Rayne Du ref. provider found

## 2013-05-06 ENCOUNTER — Telehealth (INDEPENDENT_AMBULATORY_CARE_PROVIDER_SITE_OTHER): Payer: Self-pay | Admitting: *Deleted

## 2013-05-06 DIAGNOSIS — R109 Unspecified abdominal pain: Secondary | ICD-10-CM

## 2013-05-06 DIAGNOSIS — R740 Nonspecific elevation of levels of transaminase and lactic acid dehydrogenase [LDH]: Secondary | ICD-10-CM

## 2013-05-06 DIAGNOSIS — R7401 Elevation of levels of liver transaminase levels: Secondary | ICD-10-CM

## 2013-05-06 NOTE — Telephone Encounter (Signed)
Denise Werner is having another episode of right side pain. She was told to call the office and Dr. Laural Golden would order a LFT test. Her return phone number is  [phone].

## 2013-05-06 NOTE — Telephone Encounter (Signed)
Lab order faxed to [facility] Patient made aware.

## 2013-05-06 NOTE — Telephone Encounter (Signed)
Order faxed to lab and patient was called and made aware.

## 2013-05-07 ENCOUNTER — Encounter (HOSPITAL_COMMUNITY): Payer: Self-pay | Admitting: Internal Medicine

## 2013-05-07 ENCOUNTER — Other Ambulatory Visit (INDEPENDENT_AMBULATORY_CARE_PROVIDER_SITE_OTHER): Payer: Self-pay | Admitting: Internal Medicine

## 2013-05-07 LAB — HEPATIC FUNCTION PANEL
ALT: 18 U/L (ref 0–35)
AST: 19 U/L (ref 0–37)
Albumin: 4.2 g/dL (ref 3.5–5.2)
Alkaline Phosphatase: 82 U/L (ref 39–117)
BILIRUBIN DIRECT: 0.1 mg/dL (ref 0.0–0.3)
Indirect Bilirubin: 0.2 mg/dL (ref 0.2–1.2)
Total Bilirubin: 0.3 mg/dL (ref 0.2–1.2)
Total Protein: 7 g/dL (ref 6.0–8.3)

## 2013-05-07 MED ORDER — DICYCLOMINE HCL 10 MG PO CAPS: 10.0000 mg | ORAL_CAPSULE | Freq: Three times a day (TID) | ORAL | Status: DC

## 2013-05-10 ENCOUNTER — Telehealth (INDEPENDENT_AMBULATORY_CARE_PROVIDER_SITE_OTHER): Payer: Self-pay | Admitting: *Deleted

## 2013-05-10 DIAGNOSIS — R748 Abnormal levels of other serum enzymes: Secondary | ICD-10-CM

## 2013-05-10 DIAGNOSIS — R109 Unspecified abdominal pain: Secondary | ICD-10-CM

## 2013-05-10 LAB — HEPATIC FUNCTION PANEL
ALT: 16 U/L (ref 0–35)
AST: 18 U/L (ref 0–37)
Albumin: 4 g/dL (ref 3.5–5.2)
Alkaline Phosphatase: 81 U/L (ref 39–117)
Bilirubin, Direct: 0.1 mg/dL (ref 0.0–0.3)
Indirect Bilirubin: 0.3 mg/dL (ref 0.2–1.2)
Total Bilirubin: 0.4 mg/dL (ref 0.2–1.2)
Total Protein: 7 g/dL (ref 6.0–8.3)

## 2013-05-10 NOTE — Telephone Encounter (Signed)
Denise Werner was to call after she has been using the new medication, Dicyclomine 10 mg. She can not see any difference in the pain. Doesn't feel like it is helping her. Dr. Laural Golden told her he would be doing more lab work. Her return phone number is [phone].

## 2013-05-10 NOTE — Telephone Encounter (Signed)
Notes Recorded by Rogene Houston, MD on 05/07/2013 at 6:28 PM LFTs are normal; Cells reviewed with patient; Will try her on dicyclomine 10 mg 3 times a day; If not any better will repeat LFTs in 3 days. patient to call office later this week.  Lab order for Liver Enzymes faxed to [facility].

## 2013-05-10 NOTE — Telephone Encounter (Signed)
Notes Recorded by Rogene Houston, MD on 05/07/2013 at 6:28 PM LFTs are normal; Cells reviewed with patient; Will try her on dicyclomine 10 mg 3 times a day; If not any better will repeat LFTs in 3 days. patient to call office later this week. Lab order was faxed to [facility].

## 2013-05-15 NOTE — Telephone Encounter (Signed)
Apt has been scheduled for 09/03/13 with Dr. Laural Golden.

## 2013-06-19 ENCOUNTER — Other Ambulatory Visit: Payer: Self-pay | Admitting: Family Medicine

## 2013-06-25 ENCOUNTER — Other Ambulatory Visit: Payer: Self-pay | Admitting: Family Medicine

## 2013-07-08 ENCOUNTER — Ambulatory Visit (INDEPENDENT_AMBULATORY_CARE_PROVIDER_SITE_OTHER): Payer: BC Managed Care – PPO | Admitting: Family Medicine

## 2013-07-08 ENCOUNTER — Other Ambulatory Visit: Payer: Self-pay | Admitting: Family Medicine

## 2013-07-08 ENCOUNTER — Encounter: Payer: Self-pay | Admitting: Family Medicine

## 2013-07-08 VITALS — BP 126/68 | HR 72 | Resp 18 | Wt 147.1 lb

## 2013-07-08 DIAGNOSIS — G4700 Insomnia, unspecified: Secondary | ICD-10-CM

## 2013-07-08 DIAGNOSIS — N3010 Interstitial cystitis (chronic) without hematuria: Secondary | ICD-10-CM

## 2013-07-08 DIAGNOSIS — I10 Essential (primary) hypertension: Secondary | ICD-10-CM

## 2013-07-08 DIAGNOSIS — M5431 Sciatica, right side: Secondary | ICD-10-CM

## 2013-07-08 DIAGNOSIS — M5410 Radiculopathy, site unspecified: Secondary | ICD-10-CM | POA: Insufficient documentation

## 2013-07-08 DIAGNOSIS — E785 Hyperlipidemia, unspecified: Secondary | ICD-10-CM

## 2013-07-08 DIAGNOSIS — J309 Allergic rhinitis, unspecified: Secondary | ICD-10-CM

## 2013-07-08 DIAGNOSIS — J45991 Cough variant asthma: Secondary | ICD-10-CM

## 2013-07-08 DIAGNOSIS — M549 Dorsalgia, unspecified: Secondary | ICD-10-CM | POA: Insufficient documentation

## 2013-07-08 DIAGNOSIS — M8580 Other specified disorders of bone density and structure, unspecified site: Secondary | ICD-10-CM

## 2013-07-08 DIAGNOSIS — M949 Disorder of cartilage, unspecified: Secondary | ICD-10-CM

## 2013-07-08 DIAGNOSIS — M899 Disorder of bone, unspecified: Secondary | ICD-10-CM

## 2013-07-08 MED ORDER — IBUPROFEN 600 MG PO TABS: ORAL_TABLET | ORAL | Status: DC

## 2013-07-08 MED ORDER — PREDNISONE (PAK) 5 MG PO TABS: 5.0000 mg | ORAL_TABLET | ORAL | Status: DC

## 2013-07-08 MED ORDER — LORAZEPAM 1 MG PO TABS: ORAL_TABLET | ORAL | Status: DC

## 2013-07-08 NOTE — Patient Instructions (Addendum)
F/u in 4 month, call if you need me before  Toradol and depo medrol today in office , for right leg pain and numbness  Ibuprofen ansd prednisone dose pack are sent start today MRI of lumbar spine will be done this week    BP is excellent

## 2013-07-09 DIAGNOSIS — M5430 Sciatica, unspecified side: Secondary | ICD-10-CM

## 2013-07-09 LAB — LIPID PANEL
CHOL/HDL RATIO: 3 ratio
CHOLESTEROL: 176 mg/dL (ref 0–200)
HDL: 59 mg/dL (ref >39)
LDL Cholesterol: 108 mg/dL — ABNORMAL HIGH (ref 0–99)
TRIGLYCERIDES: 47 mg/dL (ref <150)
VLDL: 9 mg/dL (ref 0–40)

## 2013-07-09 LAB — COMPREHENSIVE METABOLIC PANEL
ALBUMIN: 4.1 g/dL (ref 3.5–5.2)
ALK PHOS: 83 U/L (ref 39–117)
ALT: 24 U/L (ref 0–35)
AST: 24 U/L (ref 0–37)
BUN: 14 mg/dL (ref 6–23)
CO2: 28 mEq/L (ref 19–32)
Calcium: 9.3 mg/dL (ref 8.4–10.5)
Chloride: 106 mEq/L (ref 96–112)
Creat: 0.69 mg/dL (ref 0.50–1.10)
Glucose, Bld: 94 mg/dL (ref 70–99)
POTASSIUM: 4.3 meq/L (ref 3.5–5.3)
SODIUM: 141 meq/L (ref 135–145)
TOTAL PROTEIN: 6.7 g/dL (ref 6.0–8.3)
Total Bilirubin: 0.4 mg/dL (ref 0.2–1.2)

## 2013-07-09 LAB — HEMOGLOBIN A1C
Hgb A1c MFr Bld: 5.9 % — ABNORMAL HIGH (ref <5.7)
Mean Plasma Glucose: 123 mg/dL — ABNORMAL HIGH (ref <117)

## 2013-07-09 MED ORDER — KETOROLAC TROMETHAMINE 60 MG/2ML IM SOLN
60.0000 mg | Freq: Once | INTRAMUSCULAR | Status: AC
Start: 2013-07-09 — End: 2013-07-09
  Administered 2013-07-09: 60 mg via INTRAMUSCULAR

## 2013-07-09 MED ORDER — METHYLPREDNISOLONE ACETATE 80 MG/ML IJ SUSP
80.0000 mg | Freq: Once | INTRAMUSCULAR | Status: AC
  Administered 2013-07-09: 80 mg via INTRAMUSCULAR

## 2013-07-10 ENCOUNTER — Encounter: Payer: Self-pay | Admitting: Family Medicine

## 2013-07-10 ENCOUNTER — Ambulatory Visit (HOSPITAL_COMMUNITY)
Admission: RE | Admit: 2013-07-10 | Discharge: 2013-07-10 | Disposition: A | Discharge status: Home / Self Care | Payer: BC Managed Care – PPO | Source: Ambulatory Visit | Attending: Family Medicine | Admitting: Family Medicine

## 2013-07-10 DIAGNOSIS — M5431 Sciatica, right side: Secondary | ICD-10-CM

## 2013-07-10 DIAGNOSIS — M545 Low back pain, unspecified: Secondary | ICD-10-CM | POA: Insufficient documentation

## 2013-07-10 DIAGNOSIS — R209 Unspecified disturbances of skin sensation: Secondary | ICD-10-CM | POA: Insufficient documentation

## 2013-07-10 DIAGNOSIS — M5137 Other intervertebral disc degeneration, lumbosacral region: Secondary | ICD-10-CM | POA: Insufficient documentation

## 2013-07-10 DIAGNOSIS — M79609 Pain in unspecified limb: Secondary | ICD-10-CM | POA: Insufficient documentation

## 2013-07-10 DIAGNOSIS — M51379 Other intervertebral disc degeneration, lumbosacral region without mention of lumbar back pain or lower extremity pain: Secondary | ICD-10-CM | POA: Insufficient documentation

## 2013-07-10 DIAGNOSIS — M47817 Spondylosis without myelopathy or radiculopathy, lumbosacral region: Secondary | ICD-10-CM | POA: Insufficient documentation

## 2013-07-11 ENCOUNTER — Encounter: Payer: Self-pay | Admitting: Family Medicine

## 2013-07-11 MED ORDER — GABAPENTIN 100 MG PO CAPS: ORAL_CAPSULE | ORAL | Status: DC

## 2013-07-14 NOTE — Assessment & Plan Note (Signed)
Improved control since allergies are controlled

## 2013-07-14 NOTE — Assessment & Plan Note (Signed)
Followed by urology, recent flare , has diazepam pessaries whihc help with symptoms , though turns urine green , I encourage her to take for benefit

## 2013-07-14 NOTE — Progress Notes (Signed)
   Subjective:    Patient ID: Denise Werner, female    DOB: 01-06-1956, 58 y.o.   MRN: [id number]  HPI The PT is here for follow up and re-evaluation of chronic medical conditions, medication management and review of any available recent lab and radiology data.  Preventive health is updated, specifically  Cancer screening and Immunization.   Questions or concerns regarding consultations or procedures which the PT has had in the interim are  Addressed.Recently saw urology for flare in IC The PT denies any adverse reactions to current medications since the last visit.  1 week h/o acute numbness in rigth leg, 1 month h/o intermittent pain and numbness  Of the leg. 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 Denies sinus pressure, nasal congestion, ear pain or sore throat. Denies chest congestion, productive cough or wheezing. Denies chest pains, palpitations and leg swelling Denies abdominal pain, nausea, vomiting,diarrhea or constipation.   Denies dysuria, frequency, hesitancy or incontinence.  Denies headaches, seizures, numbness, or tingling. Denies depression, anxiety or uncontrolled  Insomnia, no longer needs meds on regular basis specifically for this Denies skin break down or rash.        Objective:   Physical Exam BP 126/68  Pulse 72  Resp 18  Wt 147 lb 1.9 oz (66.733 kg)  SpO2 100% Patient alert and oriented and in no cardiopulmonary distress.  HEENT: No facial asymmetry, EOMI,   oropharynx pink and moist.  Neck supple no JVD, no mass.  Chest: Clear to auscultation bilaterally.  CVS: S1, S2 no murmurs, no S3.  ABD: Soft non tender.   Ext: No edema  MS: Adequate ROM spine, shoulders, hips and knees.  Skin: Intact, no ulcerations or rash noted.  Psych: Good eye contact, normal affect. Memory intact not anxious or depressed appearing.  CNS: CN 2-12  intact, power,  normal throughout.decreased sensation in right thigh as compared to left , but still norma;normal reflexes throughout.        Assessment & Plan:  HTN (hypertension) Controlled, no change in medication DASH diet and commitment to daily physical activity for a minimum of 30 minutes discussed and encouraged, as a part of hypertension management. The importance of attaining a healthy weight is also discussed.   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 Shrot course of anti inflammatories , also administered in  Office , Gabapentin at bedtime , up to 300mg  for numbness and tingling  HYPERLIPIDEMIA Hyperlipidemia:Low fat diet discussed and encouraged.  Managed by diet and exercise only at this time which is appropriate , continue same  INSOMNIA Reports marked improvement in sleep no regular medication specifically for sleep at this time   Interstitial cystitis Followed by urology, recent flare , has diazepam pessaries whihc help with symptoms , though turns urine green , I encourage her to take for benefit  Osteopenia Needs update d dexa, will schedule  ALLERGIC RHINITIS CAUSE UNSPECIFIED Well controlled on immunosuppressive therapy, continue same through allergist  Cough variant asthma Improved control since allergies are controlled

## 2013-07-14 NOTE — Assessment & Plan Note (Signed)
Hyperlipidemia:Low fat diet discussed and encouraged.  Managed by diet and exercise only at this time which is appropriate , continue same

## 2013-07-14 NOTE — Assessment & Plan Note (Signed)
Well controlled on immunosuppressive therapy, continue same through allergist

## 2013-07-14 NOTE — Assessment & Plan Note (Signed)
Reports marked improvement in sleep no regular medication specifically for sleep at this time

## 2013-07-14 NOTE — Assessment & Plan Note (Signed)
Controlled, no change in medication DASH diet and commitment to daily physical activity for a minimum of 30 minutes discussed and encouraged, as a part of hypertension management. The importance of attaining a healthy weight is also discussed.  

## 2013-07-14 NOTE — Assessment & Plan Note (Signed)
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 Shrot course of anti inflammatories , also administered in  Office , Gabapentin at bedtime , up to 300mg  for numbness and tingling

## 2013-07-14 NOTE — Assessment & Plan Note (Signed)
Needs update d dexa, will schedule

## 2013-07-22 ENCOUNTER — Ambulatory Visit (HOSPITAL_COMMUNITY)
Admission: RE | Admit: 2013-07-22 | Discharge: 2013-07-22 | Disposition: A | Discharge status: Home / Self Care | Payer: BC Managed Care – PPO | Source: Ambulatory Visit | Attending: Family Medicine | Admitting: Family Medicine

## 2013-07-22 DIAGNOSIS — M8580 Other specified disorders of bone density and structure, unspecified site: Secondary | ICD-10-CM

## 2013-07-22 DIAGNOSIS — M5431 Sciatica, right side: Secondary | ICD-10-CM

## 2013-07-22 DIAGNOSIS — M949 Disorder of cartilage, unspecified: Principal | ICD-10-CM

## 2013-07-22 DIAGNOSIS — M5430 Sciatica, unspecified side: Secondary | ICD-10-CM | POA: Insufficient documentation

## 2013-07-22 DIAGNOSIS — M899 Disorder of bone, unspecified: Secondary | ICD-10-CM | POA: Insufficient documentation

## 2013-07-29 ENCOUNTER — Encounter: Payer: Self-pay | Admitting: Family Medicine

## 2013-07-29 ENCOUNTER — Ambulatory Visit: Payer: BC Managed Care – PPO | Admitting: Family Medicine

## 2013-07-30 ENCOUNTER — Encounter: Payer: Self-pay | Admitting: Family Medicine

## 2013-09-03 ENCOUNTER — Encounter (INDEPENDENT_AMBULATORY_CARE_PROVIDER_SITE_OTHER): Payer: Self-pay | Admitting: Internal Medicine

## 2013-09-03 ENCOUNTER — Ambulatory Visit (INDEPENDENT_AMBULATORY_CARE_PROVIDER_SITE_OTHER): Payer: BC Managed Care – PPO | Admitting: Internal Medicine

## 2013-09-03 VITALS — BP 112/70 | HR 72 | Temp 97.4°F | Resp 18 | Ht 66.5 in | Wt 145.1 lb

## 2013-09-03 DIAGNOSIS — R109 Unspecified abdominal pain: Secondary | ICD-10-CM

## 2013-09-03 DIAGNOSIS — M5431 Sciatica, right side: Secondary | ICD-10-CM

## 2013-09-03 DIAGNOSIS — M5430 Sciatica, unspecified side: Secondary | ICD-10-CM

## 2013-09-03 DIAGNOSIS — K219 Gastro-esophageal reflux disease without esophagitis: Secondary | ICD-10-CM

## 2013-09-03 MED ORDER — DICYCLOMINE HCL 10 MG PO CAPS: 10.0000 mg | ORAL_CAPSULE | Freq: Three times a day (TID) | ORAL | Status: DC | PRN

## 2013-09-03 MED ORDER — IBUPROFEN 600 MG PO TABS: 600.0000 mg | ORAL_TABLET | Freq: Every day | ORAL | Status: DC | PRN

## 2013-09-03 NOTE — Progress Notes (Signed)
Presenting complaint;  Followup for right-sided abdominal pain and GERD.  Subjective:  Patient is 58 year old Afro-American female who presents for scheduled visit. She was last seen in March 2015 by Ms. Setzer, NP for mildly elevated transaminases and negative workup and subsequent transaminases have remained normal. She continues to complain of pain in the right mid abdomen which is experienced when she eats fiber rich on healthy foods. Pain is not as frequent or as intense as it used to be. She is using dicyclomine on as-needed basis. She states she is having excellent results with pantoprazole. She rarely has heartburn or regurgitation. She takes Valium once or twice a week for interstitial cystitis. She wants to know when she should have next colonoscopy. Last colonoscopy was in February 20 10th revealing mild sigmoid diverticulosis.   Current Medications: Outpatient Encounter Prescriptions as of 09/03/2013  Medication Sig  . alendronate (FOSAMAX) 70 MG tablet TAKE 1 TABLET EVERY 7 DAYS. TAKE WITH FUL GLASS OF WATER ON EMPTY STOMACH  . amLODipine (NORVASC) 5 MG tablet TAKE 1 TABLET BY MOUTH DAILY  . Calcium Carbonate-Vit D-Min (CALCIUM 1200) 1200-1000 MG-UNIT CHEW Chew 1 tablet by mouth daily.   . diazepam (VALIUM) 10 MG tablet Take 10 mg by mouth as needed.   . dicyclomine (BENTYL) 10 MG capsule Take 1 capsule (10 mg total) by mouth 3 (three) times daily before meals.  . gabapentin (NEURONTIN) 100 MG capsule Three capsules at bedtime , as needed, for tingling in right leg  . ibuprofen (ADVIL,MOTRIN) 600 MG tablet One tablet three times daily for 10 days  . LORazepam (ATIVAN) 1 MG tablet One tablet 30 minutes before test, may reopeat once after 15 minutes if still anxious  . mometasone-formoterol (DULERA) 100-5 MCG/ACT AERO Inhale 2 puffs into the lungs as needed.  . pantoprazole (PROTONIX) 40 MG tablet Take 1 tablet (40 mg total) by mouth daily.  . pentosan polysulfate (ELMIRON) 100 MG  capsule Take 100 mg by mouth as needed.  . traMADol (ULTRAM) 50 MG tablet Take by mouth every 6 (six) hours as needed.  . [DISCONTINUED] predniSONE (STERAPRED UNI-PAK) 5 MG TABS tablet Take 1 tablet (5 mg total) by mouth as directed.     Objective: Blood pressure 112/70, pulse 72, temperature 97.4 F (36.3 C), temperature source Oral, resp. rate 18, height 5' 6.5" (1.689 m), weight 145 lb 1.6 oz (65.817 kg). Patient is alert and in no acute distress. Conjunctiva is pink. Sclera is nonicteric Oropharyngeal mucosa is normal. No neck masses or thyromegaly noted. Cardiac exam with regular rhythm normal S1 and S2. No murmur or gallop noted. Lungs are clear to auscultation. Abdomen symmetrical. Bowel sounds are normal. On palpation abdomen is soft and nontender without organomegaly or masses.  No LE edema or clubbing noted.  Labs/studies Results: LFTs from 07/08/2013  Bilirubin 0.4, AP 83, AST 24, ALT 24 and albumin 4.1   Assessment:  #1. Right-sided abdominal pain most likely due to IBS. Pain is not every day and seemed to be triggered when she eats fiber rich foods. #2. GERD. Excellent symptom control with dietary measures and pantoprazole. #3. History of mildly elevated transaminases noted in February this year; her transaminases have been normal x4 since then. Viral markers for hepatitis A B. and C and  autoimmune markers were negative. No further workup unless transaminases go up again. #4. She is up-to-date for CRC screening. Next colonoscopy would be in 2020.   Plan:  Continue pantoprazole at 40 mg every morning. Use dicyclomine  10 mg 3 times a day when necessary. Office visit in one year unless abdominal pain progresses.

## 2013-09-03 NOTE — Patient Instructions (Signed)
Call if abdominal pain increases in intensity or severity.

## 2013-10-02 ENCOUNTER — Ambulatory Visit (INDEPENDENT_AMBULATORY_CARE_PROVIDER_SITE_OTHER): Payer: BC Managed Care – PPO

## 2013-10-02 DIAGNOSIS — Z23 Encounter for immunization: Secondary | ICD-10-CM

## 2013-10-02 NOTE — Addendum Note (Signed)
Addended by: Denman George B on: 10/02/2013 08:41 AM   Modules accepted: Orders

## 2013-10-10 ENCOUNTER — Other Ambulatory Visit: Payer: Self-pay | Admitting: Family Medicine

## 2013-10-10 ENCOUNTER — Telehealth: Payer: Self-pay | Admitting: Family Medicine

## 2013-10-10 ENCOUNTER — Encounter: Payer: Self-pay | Admitting: Family Medicine

## 2013-10-10 DIAGNOSIS — R5383 Other fatigue: Secondary | ICD-10-CM

## 2013-10-10 DIAGNOSIS — R5381 Other malaise: Secondary | ICD-10-CM | POA: Insufficient documentation

## 2013-10-10 LAB — LIPID PANEL
CHOL/HDL RATIO: 3.6 ratio
Cholesterol: 171 mg/dL (ref 0–200)
HDL: 48 mg/dL (ref >39)
LDL CALC: 109 mg/dL — AB (ref 0–99)
TRIGLYCERIDES: 70 mg/dL (ref <150)
VLDL: 14 mg/dL (ref 0–40)

## 2013-10-10 LAB — ANEMIA PANEL 7
%SAT: 16 % — AB (ref 20–55)
ABS Retic: 43.9 10*3/uL (ref 19.0–186.0)
Ferritin: 110 ng/mL (ref 10–291)
Folate: 17.6 ng/mL
HCT: 36.9 % (ref 36.0–46.0)
HEMOGLOBIN: 12 g/dL (ref 12.0–15.0)
IRON: 46 ug/dL (ref 42–145)
MCH: 27.3 pg (ref 26.0–34.0)
MCHC: 32.5 g/dL (ref 30.0–36.0)
MCV: 84.1 fL (ref 78.0–100.0)
PLATELETS: 278 10*3/uL (ref 150–400)
RBC.: 4.39 MIL/uL (ref 3.87–5.11)
RBC: 4.39 MIL/uL (ref 3.87–5.11)
RDW: 14.3 % (ref 11.5–15.5)
Retic Ct Pct: 1 % (ref 0.4–2.3)
TIBC: 291 ug/dL (ref 250–470)
UIBC: 245 ug/dL (ref 125–400)
VITAMIN B 12: 359 pg/mL (ref 211–911)
WBC: 6 10*3/uL (ref 4.0–10.5)

## 2013-10-10 LAB — HEMOGLOBIN A1C
Hgb A1c MFr Bld: 6.2 % — ABNORMAL HIGH (ref <5.7)
Mean Plasma Glucose: 131 mg/dL — ABNORMAL HIGH (ref <117)

## 2013-10-10 LAB — TSH: TSH: 2.801 u[IU]/mL (ref 0.350–4.500)

## 2013-10-10 LAB — COMPREHENSIVE METABOLIC PANEL
ALT: 20 U/L (ref 0–35)
AST: 21 U/L (ref 0–37)
Albumin: 3.9 g/dL (ref 3.5–5.2)
Alkaline Phosphatase: 80 U/L (ref 39–117)
BUN: 14 mg/dL (ref 6–23)
CO2: 27 mEq/L (ref 19–32)
CREATININE: 0.76 mg/dL (ref 0.50–1.10)
Calcium: 9.3 mg/dL (ref 8.4–10.5)
Chloride: 104 mEq/L (ref 96–112)
GLUCOSE: 98 mg/dL (ref 70–99)
Potassium: 4.1 mEq/L (ref 3.5–5.3)
Sodium: 139 mEq/L (ref 135–145)
Total Bilirubin: 0.2 mg/dL (ref 0.2–1.2)
Total Protein: 7 g/dL (ref 6.0–8.3)

## 2013-10-10 NOTE — Telephone Encounter (Signed)
8 day h/o excessive fatigue and sleepiness, may I get my labs checked, pt communicated this on 10/09/2013  Labs  Ordered are as following to be drawn this am CBC and anemia panel, fasting lipid and cmp, HBA1C and TSH

## 2013-10-11 ENCOUNTER — Other Ambulatory Visit: Payer: Self-pay | Admitting: Family Medicine

## 2013-10-11 MED ORDER — ROSUVASTATIN CALCIUM 10 MG PO TABS: 10.0000 mg | ORAL_TABLET | Freq: Every day | ORAL | Status: DC

## 2013-11-01 ENCOUNTER — Other Ambulatory Visit: Payer: Self-pay

## 2013-11-01 MED ORDER — ALENDRONATE SODIUM 70 MG PO TABS: ORAL_TABLET | ORAL | Status: DC

## 2013-11-04 ENCOUNTER — Other Ambulatory Visit: Payer: Self-pay

## 2013-11-04 DIAGNOSIS — Z1239 Encounter for other screening for malignant neoplasm of breast: Secondary | ICD-10-CM

## 2013-11-18 ENCOUNTER — Ambulatory Visit
Admission: RE | Admit: 2013-11-18 | Discharge: 2013-11-18 | Disposition: A | Discharge status: Home / Self Care | Payer: BC Managed Care – PPO | Source: Ambulatory Visit

## 2013-11-18 DIAGNOSIS — Z1239 Encounter for other screening for malignant neoplasm of breast: Secondary | ICD-10-CM

## 2013-12-04 ENCOUNTER — Encounter: Payer: Self-pay | Admitting: Family Medicine

## 2013-12-04 ENCOUNTER — Other Ambulatory Visit (HOSPITAL_COMMUNITY)
Admission: RE | Admit: 2013-12-04 | Discharge: 2013-12-04 | Disposition: A | Discharge status: Home / Self Care | Payer: BC Managed Care – PPO | Source: Ambulatory Visit | Attending: Family Medicine | Admitting: Family Medicine

## 2013-12-04 ENCOUNTER — Ambulatory Visit (INDEPENDENT_AMBULATORY_CARE_PROVIDER_SITE_OTHER): Payer: BC Managed Care – PPO | Admitting: Family Medicine

## 2013-12-04 ENCOUNTER — Telehealth: Payer: Self-pay | Admitting: Family Medicine

## 2013-12-04 VITALS — BP 116/74 | HR 84 | Resp 18 | Ht 66.0 in | Wt 145.1 lb

## 2013-12-04 DIAGNOSIS — R7309 Other abnormal glucose: Secondary | ICD-10-CM

## 2013-12-04 DIAGNOSIS — Z01419 Encounter for gynecological examination (general) (routine) without abnormal findings: Secondary | ICD-10-CM | POA: Diagnosis not present

## 2013-12-04 DIAGNOSIS — Z124 Encounter for screening for malignant neoplasm of cervix: Secondary | ICD-10-CM

## 2013-12-04 DIAGNOSIS — Z1211 Encounter for screening for malignant neoplasm of colon: Secondary | ICD-10-CM

## 2013-12-04 DIAGNOSIS — Z0000 Encounter for general adult medical examination without abnormal findings: Secondary | ICD-10-CM

## 2013-12-04 DIAGNOSIS — R7303 Prediabetes: Secondary | ICD-10-CM

## 2013-12-04 DIAGNOSIS — N898 Other specified noninflammatory disorders of vagina: Secondary | ICD-10-CM | POA: Insufficient documentation

## 2013-12-04 DIAGNOSIS — Z1151 Encounter for screening for human papillomavirus (HPV): Secondary | ICD-10-CM | POA: Diagnosis present

## 2013-12-04 DIAGNOSIS — E785 Hyperlipidemia, unspecified: Secondary | ICD-10-CM

## 2013-12-04 DIAGNOSIS — R0789 Other chest pain: Secondary | ICD-10-CM

## 2013-12-04 MED ORDER — ESTROGENS, CONJUGATED 0.625 MG/GM VA CREA: TOPICAL_CREAM | VAGINAL | Status: DC

## 2013-12-04 NOTE — Patient Instructions (Addendum)
F/u  End of March, please call if you need me before  Fasting labs in December  You are referred for heart re evaluation due to intermittent chest pain  Trial of small amount of premarin cream intravaginally (fingertip0 2 to 3 times weekly will help excessive dryness , also use Replens, an OTC vaginal lubricant with no hormone  It is important that you exercise regularly at least 30 minutes 5 times a week. If you develop chest pain, have severe difficulty breathing, or feel very tired, stop exercising immediately and seek medical attention

## 2013-12-04 NOTE — Progress Notes (Signed)
   Subjective:    Patient ID: Denise Werner, female    DOB: 29-Sep-1955, 58 y.o.   MRN: [id number]  HPI Patient is in for annual physical exam C/o increase in intermittent chest pain with some palpitations , most often related to emotional stress. She remains anxious re heart disease, wants to "live for her new grand baby " in particular, requests cardiology follow  Up /o vaginal dryness and dyspareunia , but not keen on topical estrogen an has had no success with other lubricants, increased activity necouraged to improve lubrication   Review of Systems See HPI     Objective:   Physical Exam BP 116/74 mmHg  Pulse 84  Resp 18  Ht 5\' 6"  (1.676 m)  Wt 145 lb 1.9 oz (65.826 kg)  BMI 23.43 kg/m2  SpO2 100% Pleasant well nourished female, alert and oriented x 3, in no cardio-pulmonary distress. Afebrile. HEENT No facial trauma or asymetry. Sinuses non tender.  Extra occullar muscles intact, pupils equally reactive to light. External ears normal, tympanic membranes clear. Oropharynx moist, no exudate, good dentition. Neck: supple, no adenopathy,JVD or thyromegaly.No bruits.  Chest: Clear to ascultation bilaterally.No crackles or wheezes. Non tender to palpation  Breast: No asymetry,no masses or lumps. No chest wall  tenderness. No nipple discharge or inversion. No axillary or supraclavicular adenopathy  Cardiovascular system; Heart sounds normal,  S1 and  S2 ,no S3.  No murmur, or thrill. Apical beat not displaced Peripheral pulses normal.  Abdomen: Soft, non tender, no organomegaly or masses. No bruits. Bowel sounds normal. No guarding, tenderness or rebound.  Rectal:  Normal sphincter tone. No mass.No rectal masses.  Guaiac negative stool.  GU: External genitalia normal female genitalia , female distribution of hair. No lesions. Urethral meatus normal in size, no  Prolapse, no lesions visibly  Present. Bladder non tender. Vagina pink and moist , with no  visible lesions , scant physiologic discharge present . Adequate pelvic support no  cystocele or rectocele noted  Uterus absent , no adnexal masses, no  adnexal tenderness.   Musculoskeletal exam: Full ROM of spine, hips , shoulders and knees. No deformity ,swelling or crepitus noted. No muscle wasting or atrophy.   Neurologic: Cranial nerves 2 to 12 intact. Power, tone ,sensation and reflexes normal throughout. No disturbance in gait. No tremor.  Skin: Intact, no ulceration, erythema , scaling or rash noted. Pigmentation normal throughout  Psych; Normal mood and affect. Judgement and concentration normal        Assessment & Plan:  Annual physical exam Annual exam as documented. Counseling done  re healthy lifestyle involving commitment to 150 minutes exercise per week, heart healthy diet, and attaining healthy weight.The importance of adequate sleep also discussed. Regular seat belt use and home safety, is also discussed. Changes in health habits are decided on by the patient with goals and time frames  set for achieving them. Immunization and cancer screening needs are specifically addressed at this visit.   Vaginal dryness Topical premarin to be used sparing;y max 3 times weekly , as needed, pt to et back to me as far as benefit is concerned, no refills  Special screening for malignant neoplasms, colon Heme negative stool , no mass  Chest pain, atypical Intermittent left chest pain and palpitations primarily associated with stress, will refer back to cardiology as she is scheduled for annual f/u  Risk factors for CAD are hTN,hyperlipidemia and prediabetes

## 2013-12-10 LAB — CYTOLOGY - PAP

## 2013-12-19 ENCOUNTER — Other Ambulatory Visit: Payer: Self-pay

## 2013-12-23 ENCOUNTER — Ambulatory Visit (INDEPENDENT_AMBULATORY_CARE_PROVIDER_SITE_OTHER): Payer: BC Managed Care – PPO | Admitting: Internal Medicine

## 2013-12-23 ENCOUNTER — Encounter: Payer: Self-pay | Admitting: Internal Medicine

## 2013-12-23 VITALS — BP 116/70 | HR 75 | Ht 66.0 in | Wt 146.0 lb

## 2013-12-23 DIAGNOSIS — R079 Chest pain, unspecified: Secondary | ICD-10-CM

## 2013-12-23 NOTE — Patient Instructions (Signed)
Your physician wants you to follow-up in: 1 year You will receive a reminder letter in the mail two months in advance. If you don't receive a letter, please call our office to schedule the follow-up appointment.    Your physician recommends that you continue on your current medications as directed. Please refer to the Current Medication list given to you today.     Thank you for choosing [facility] !  

## 2013-12-23 NOTE — Progress Notes (Signed)
HPI Patient is a 58 yo who I saw initially in Jan 2014 Referred for CP which I thought was atypical  Increased prevacid BP was elevated and I added amlodipine I saw the patinet again in 2014  Still had intermitt CP  Since I saw her last she continues to have intermitt CP  Last seconds.  Comes on with mental stress  With physical activity denies. Breathing is OK    Notes occasional fuzzy sensation in head Did have esophagus dilated      Allergies  Allergen Reactions  . Latex Rash  . Hydrocodone Nausea And Vomiting  . Sulfonamide Derivatives     Current Outpatient Prescriptions  Medication Sig Dispense Refill  . alendronate (FOSAMAX) 70 MG tablet TAKE 1 TABLET EVERY 7 DAYS. TAKE WITH FUL GLASS OF WATER ON EMPTY STOMACH 4 tablet 3  . amLODipine (NORVASC) 5 MG tablet TAKE 1 TABLET BY MOUTH DAILY 30 tablet 9  . Calcium Carbonate-Vit D-Min (CALCIUM 1200) 1200-1000 MG-UNIT CHEW Chew 1 tablet by mouth daily.     Marland Kitchen conjugated estrogens (PREMARIN) vaginal cream Fingertip amount of cream, intravaginally , three times weekly , as needed, for excessive vaginal dryness 42.5 g 0  . diazepam (VALIUM) 10 MG tablet     . gabapentin (NEURONTIN) 100 MG capsule Three capsules at bedtime , as needed, for tingling in right leg 90 capsule 3  . mometasone-formoterol (DULERA) 100-5 MCG/ACT AERO Inhale 2 puffs into the lungs as needed.    . pantoprazole (PROTONIX) 40 MG tablet Take 1 tablet (40 mg total) by mouth daily. 30 tablet 5  . rosuvastatin (CRESTOR) 10 MG tablet Take 1 tablet (10 mg total) by mouth daily. 30 tablet 4  . traMADol (ULTRAM) 50 MG tablet Take by mouth every 6 (six) hours as needed.     No current facility-administered medications for this visit.    Past Medical History  Diagnosis Date  . Interstitial cystitis   . Chronic neck pain   . H/O hiatal hernia   . GERD (gastroesophageal reflux disease) OCCASIONAL  . Dyslipidemia   . Asthma   . Short of breath on exertion   . Frequency of  urination   . Urgency of urination   . Nocturia   . Bladder pain     Past Surgical History  Procedure Laterality Date  . Appendectomy    . Finger surgery  10-13-2009    left 5th finger ARTHRODESIS  . Cysto/ hod/ instillation therapy  11-17-2008;   12-02-1999    INTERSTITIAL CYSTITIS  . Foot arthroplasty  05-03-2004    FIFTHE TOE RIGHT FOOT  . Bunionectomy  04-14-2003    LEFT FOOT  W/ POST REMOVAL INTERNAL FIXATION DEVICE  VIA SILVER BUNIONECTOMY  . Cholecystectomy  1998  . Abdominal hysterectomy      W/ BILATERAL SALPINGOOPHECTOMY  . Knee arthroscopy  JAN 2013    LEFT KNEE  . Cysto with hydrodistension  09/12/2011    Procedure: CYSTOSCOPY/HYDRODISTENSION;  Surgeon: Ailene Rud, MD;  Location: [facility];  Service: Urology;  Laterality: N/A;  INSTILL M & P AND M & P  . Esophagogastroduodenoscopy N/A 05/01/2013    Procedure: ESOPHAGOGASTRODUODENOSCOPY (EGD);  Surgeon: Rogene Houston, MD;  [facility];  Service: Endoscopy;  Laterality: N/A;  100    No family history on file.  History   Social History  . Marital Status: Married    Spouse Name: N/A    Number of Children: N/A  .  Years of Education: N/A   Occupational History  . Not on file.   Social History Main Topics  . Smoking status: Never Smoker   . Smokeless tobacco: Never Used  . Alcohol Use: No  . Drug Use: No  . Sexual Activity: Yes    Birth Control/ Protection: Surgical   Other Topics Concern  . Not on file   Social History Narrative    Review of Systems:  All systems reviewed.  They are negative to the above problem except as previously stated.  Vital Signs: BP 116/70 mmHg  Pulse 75  Ht 5\' 6"  (1.676 m)  Wt 146 lb (66.225 kg)  BMI 23.58 kg/m2  Physical Exam Patient is in NAD HEENT:  Normocephalic, atraumatic. EOMI, PERRLA.  Neck: JVP is normal.  No bruits.  Lungs: clear to auscultation. No rales no wheezes.  Heart: Regular rate and rhythm. Normal S1, S2. No  S3.   No significant murmurs. PMI not displaced.  Abdomen:  Supple, nontender. Normal bowel sounds. No masses. No hepatomegaly.  Extremities:   Good distal pulses throughout. No lower extremity edema.  Musculoskeletal :moving all extremities.  Neuro:   alert and oriented x3.  CN II-XII grossly intact.  EKG 71 bpm  SL St depression inferior lateral leads   Assessment and Plan:  1.  HTN  Good control Keep on amlodipine. Notes some fuzziness Could cut in 1/2 and follow  If no change would resume whole    2.  CP  Atypical for cardiac  ? GI  Follow  I would not plan any testing.  3.  HL  On crestor   Need to track lipids when on statin. Odette Horns studies in past have not shown atherosclerosis.    F/U in 1 year

## 2014-01-07 ENCOUNTER — Encounter: Payer: Self-pay | Admitting: Family Medicine

## 2014-01-07 ENCOUNTER — Telehealth: Payer: Self-pay | Admitting: Family Medicine

## 2014-01-07 MED ORDER — IMIPRAMINE HCL 10 MG PO TABS: 10.0000 mg | ORAL_TABLET | Freq: Every day | ORAL | Status: DC

## 2014-01-07 NOTE — Telephone Encounter (Signed)
2 recent episodes of bed wetting, has appt with urology in next 3 days, will send in imipramine low dose in the interim  Pt to be notified not to use tramdol while on this medication, will send e message

## 2014-01-09 ENCOUNTER — Telehealth: Payer: Self-pay | Admitting: Family Medicine

## 2014-01-09 DIAGNOSIS — N3944 Nocturnal enuresis: Secondary | ICD-10-CM

## 2014-01-09 DIAGNOSIS — N3010 Interstitial cystitis (chronic) without hematuria: Secondary | ICD-10-CM

## 2014-01-09 NOTE — Telephone Encounter (Addendum)
Pt called in stating she had 2 episodes of bedwetting in the past several weeks which is extremely distressing for her. She denies symptoms of infection She does have IC and has been followed for years in [place] for this, [facility] I sent in imipramine for bedtime use and recommended f/u with urologist. She requests transfer of her care to local urology for geographic reasons, will enter referral, she is aware

## 2014-01-21 ENCOUNTER — Ambulatory Visit (INDEPENDENT_AMBULATORY_CARE_PROVIDER_SITE_OTHER): Payer: BC Managed Care – PPO | Admitting: Urology

## 2014-01-21 DIAGNOSIS — N3010 Interstitial cystitis (chronic) without hematuria: Secondary | ICD-10-CM

## 2014-01-21 DIAGNOSIS — R102 Pelvic and perineal pain: Secondary | ICD-10-CM

## 2014-01-30 ENCOUNTER — Other Ambulatory Visit: Payer: Self-pay

## 2014-01-30 DIAGNOSIS — E785 Hyperlipidemia, unspecified: Secondary | ICD-10-CM

## 2014-02-05 LAB — LIPID PANEL
Cholesterol: 214 mg/dL — ABNORMAL HIGH (ref 0–200)
HDL: 56 mg/dL (ref >39)
LDL Cholesterol: 148 mg/dL — ABNORMAL HIGH (ref 0–99)
TRIGLYCERIDES: 51 mg/dL (ref <150)
Total CHOL/HDL Ratio: 3.8 Ratio
VLDL: 10 mg/dL (ref 0–40)

## 2014-02-06 ENCOUNTER — Telehealth: Payer: Self-pay | Admitting: *Deleted

## 2014-02-06 NOTE — Telephone Encounter (Signed)
-----   Message from Lendon Colonel, NP sent at 02/05/2014  6:09 PM EST ----- Cholesterol studies are more elevated. She is on Crestor 10 mg daily. Please make sure she is taking it. Send low cholesterol diet instructions. May need to go up on Crestor to 20 mg if remains elevated on next check in 3 months.

## 2014-02-06 NOTE — Telephone Encounter (Signed)
Patient made aware of results. Patient voiced understanding. Diet instructions placed and out box.

## 2014-02-10 DIAGNOSIS — Z1211 Encounter for screening for malignant neoplasm of colon: Secondary | ICD-10-CM | POA: Insufficient documentation

## 2014-02-10 DIAGNOSIS — Z0000 Encounter for general adult medical examination without abnormal findings: Secondary | ICD-10-CM | POA: Insufficient documentation

## 2014-02-10 NOTE — Assessment & Plan Note (Signed)

## 2014-02-10 NOTE — Assessment & Plan Note (Signed)
Heme negative stool, no mass 

## 2014-02-10 NOTE — Assessment & Plan Note (Signed)
Intermittent left chest pain and palpitations primarily associated with stress, will refer back to cardiology as she is scheduled for annual f/u  Risk factors for CAD are hTN,hyperlipidemia and prediabetes

## 2014-02-10 NOTE — Assessment & Plan Note (Signed)
Topical premarin to be used sparing;y max 3 times weekly , as needed, pt to et back to me as far as benefit is concerned, no refills

## 2014-02-12 ENCOUNTER — Telehealth: Payer: Self-pay | Admitting: Family Medicine

## 2014-02-12 DIAGNOSIS — R7303 Prediabetes: Secondary | ICD-10-CM

## 2014-02-12 DIAGNOSIS — E785 Hyperlipidemia, unspecified: Secondary | ICD-10-CM

## 2014-02-12 NOTE — Telephone Encounter (Addendum)
Pls call pt, labs I had ordered were not done when she had labs for cardiology She needs non fasting cmp, and hBa1C to check kidney, liver and blood sugar average. Let her knwo i am aware tha her chiolesterol has increased, so she needs to reduce fatty food intake and take nmed as prescribed  Also please let her know that her ins company has sent a msg re the fact that she does not seem to be filling medication for refl;ux, since based on her history, she has had  To have her esophagus (sweallowing tube ) stretched, if she has further questions about this I suggest she check with Dr Olevia Perches office.  Protonix is on her med list , but  It appears she is niot filling the med regularly She has no a[ppt here scheduled, needs one for 3.5 to 4  month pls. She needs these extra labs now, and will needf fasting lipid, cmp and hBA1C for 4 month f/u, hold on ordering thioose till she gets the extra she needs now.   ??pls ask  OK to call heron her cell, she will call back

## 2014-02-13 ENCOUNTER — Other Ambulatory Visit: Payer: Self-pay

## 2014-02-13 MED ORDER — PANTOPRAZOLE SODIUM 40 MG PO TBEC: 40.0000 mg | DELAYED_RELEASE_TABLET | Freq: Every day | ORAL | Status: DC

## 2014-02-13 NOTE — Telephone Encounter (Signed)
Refill x 4 please and let her know, also refill the protonix

## 2014-02-13 NOTE — Telephone Encounter (Signed)
Patient is aware of information.  She will have labs done on 1/15.   Appointment for followup mailed to patient along with repeat labs to be drawn before appointment.  She is requesting a refill be sent in on amitriptyline and protonix.

## 2014-02-13 NOTE — Telephone Encounter (Signed)
Is it ok to refill amitriptyline?  Patient states that she is not sleeping well.

## 2014-02-13 NOTE — Telephone Encounter (Signed)
I sent impramine if she was taking most recently, psls refill, I had ordered refills too,  that that is what is to be refilled, it does help both with sleep and excessive urination Pls discuss further with pt

## 2014-02-13 NOTE — Telephone Encounter (Signed)
Not able to find amitriptyline on med list.  What strength would you like to send in?  Called pharmacy and they do not see the medicine on her profile.

## 2014-02-13 NOTE — Addendum Note (Signed)
Addended by: Denman George B on: 02/13/2014 08:49 AM   Modules accepted: Orders

## 2014-02-14 ENCOUNTER — Other Ambulatory Visit: Payer: Self-pay

## 2014-02-14 LAB — COMPREHENSIVE METABOLIC PANEL
ALT: 16 U/L (ref 0–35)
AST: 17 U/L (ref 0–37)
Albumin: 3.9 g/dL (ref 3.5–5.2)
Alkaline Phosphatase: 85 U/L (ref 39–117)
BILIRUBIN TOTAL: 0.4 mg/dL (ref 0.2–1.2)
BUN: 16 mg/dL (ref 6–23)
CO2: 26 mEq/L (ref 19–32)
CREATININE: 0.76 mg/dL (ref 0.50–1.10)
Calcium: 9.2 mg/dL (ref 8.4–10.5)
Chloride: 102 mEq/L (ref 96–112)
Glucose, Bld: 93 mg/dL (ref 70–99)
Potassium: 4.2 mEq/L (ref 3.5–5.3)
SODIUM: 138 meq/L (ref 135–145)
Total Protein: 7 g/dL (ref 6.0–8.3)

## 2014-02-14 LAB — HEMOGLOBIN A1C
Hgb A1c MFr Bld: 5.9 % — ABNORMAL HIGH (ref <5.7)
MEAN PLASMA GLUCOSE: 123 mg/dL — AB (ref <117)

## 2014-02-14 MED ORDER — IMIPRAMINE HCL 10 MG PO TABS
10.0000 mg | ORAL_TABLET | Freq: Every day | ORAL | Status: DC
Start: 2014-02-14 — End: 2014-03-24

## 2014-02-14 NOTE — Telephone Encounter (Signed)
Called and left patient a message notifying of rx.

## 2014-02-18 ENCOUNTER — Ambulatory Visit (INDEPENDENT_AMBULATORY_CARE_PROVIDER_SITE_OTHER): Payer: BC Managed Care – PPO | Admitting: Family Medicine

## 2014-02-18 ENCOUNTER — Encounter: Payer: Self-pay | Admitting: Family Medicine

## 2014-02-18 VITALS — BP 118/78 | HR 92 | Temp 99.2°F | Resp 16 | Ht 66.0 in | Wt 143.0 lb

## 2014-02-18 DIAGNOSIS — J029 Acute pharyngitis, unspecified: Secondary | ICD-10-CM

## 2014-02-18 DIAGNOSIS — J111 Influenza due to unidentified influenza virus with other respiratory manifestations: Secondary | ICD-10-CM

## 2014-02-18 DIAGNOSIS — I10 Essential (primary) hypertension: Secondary | ICD-10-CM

## 2014-02-18 DIAGNOSIS — J1189 Influenza due to unidentified influenza virus with other manifestations: Secondary | ICD-10-CM

## 2014-02-18 DIAGNOSIS — J209 Acute bronchitis, unspecified: Secondary | ICD-10-CM

## 2014-02-18 DIAGNOSIS — J45991 Cough variant asthma: Secondary | ICD-10-CM

## 2014-02-18 DIAGNOSIS — N3000 Acute cystitis without hematuria: Secondary | ICD-10-CM

## 2014-02-18 DIAGNOSIS — E785 Hyperlipidemia, unspecified: Secondary | ICD-10-CM

## 2014-02-18 DIAGNOSIS — N3010 Interstitial cystitis (chronic) without hematuria: Secondary | ICD-10-CM

## 2014-02-18 DIAGNOSIS — R05 Cough: Secondary | ICD-10-CM

## 2014-02-18 DIAGNOSIS — R059 Cough, unspecified: Secondary | ICD-10-CM

## 2014-02-18 LAB — POCT URINALYSIS DIPSTICK
Bilirubin, UA: NEGATIVE
Blood, UA: NEGATIVE
Glucose, UA: NEGATIVE
KETONES UA: NEGATIVE
LEUKOCYTES UA: NEGATIVE
NITRITE UA: NEGATIVE
PROTEIN UA: NEGATIVE
Spec Grav, UA: <=1.005
UROBILINOGEN UA: 0.2
pH, UA: 6

## 2014-02-18 LAB — POCT RAPID STREP A (OFFICE): RAPID STREP A SCREEN: NEGATIVE

## 2014-02-18 LAB — POC HEMOCCULT BLD/STL (OFFICE/1-CARD/DIAGNOSTIC): Fecal Occult Blood, POC: NEGATIVE

## 2014-02-18 MED ORDER — KETOROLAC TROMETHAMINE 60 MG/2ML IM SOLN
60.0000 mg | Freq: Once | INTRAMUSCULAR | Status: AC
  Administered 2014-02-18: 60 mg via INTRAMUSCULAR

## 2014-02-18 MED ORDER — METHYLPREDNISOLONE ACETATE 80 MG/ML IJ SUSP
80.0000 mg | Freq: Once | INTRAMUSCULAR | Status: AC
  Administered 2014-02-18: 80 mg via INTRAMUSCULAR

## 2014-02-18 MED ORDER — IPRATROPIUM BROMIDE 0.02 % IN SOLN
0.5000 mg | Freq: Once | RESPIRATORY_TRACT | Status: AC
  Administered 2014-02-18: 0.5 mg via RESPIRATORY_TRACT

## 2014-02-18 MED ORDER — CEFTRIAXONE SODIUM 500 MG IJ SOLR
500.0000 mg | Freq: Once | INTRAMUSCULAR | Status: AC
  Administered 2014-02-18: 500 mg via INTRAMUSCULAR

## 2014-02-18 MED ORDER — CLARITHROMYCIN 500 MG PO TABS: 500.0000 mg | ORAL_TABLET | Freq: Two times a day (BID) | ORAL | Status: DC

## 2014-02-18 MED ORDER — PROMETHAZINE-DM 6.25-15 MG/5ML PO SYRP: ORAL_SOLUTION | ORAL | Status: DC

## 2014-02-18 MED ORDER — OSELTAMIVIR PHOSPHATE 75 MG PO CAPS: 75.0000 mg | ORAL_CAPSULE | Freq: Two times a day (BID) | ORAL | Status: DC

## 2014-02-18 MED ORDER — ALBUTEROL SULFATE (2.5 MG/3ML) 0.083% IN NEBU
2.5000 mg | INHALATION_SOLUTION | Freq: Once | RESPIRATORY_TRACT | Status: AC
  Administered 2014-02-18: 2.5 mg via RESPIRATORY_TRACT

## 2014-02-18 MED ORDER — PREDNISONE (PAK) 5 MG PO TABS: 5.0000 mg | ORAL_TABLET | ORAL | Status: DC

## 2014-02-18 NOTE — Patient Instructions (Addendum)
F/u as before  You are treated for influenza and acute bronchitis, also asthma flare  Injections in office today for pain and cough are given, toradol, depo medrol and Rocephin and a breathing treatment  Medications are prescribed, tamiflu, clarithromycin , prednisone dose pack and phenergan DM  Use tylenol for pain and fever, ensure excellent fluid intake , and REST  Urine is being tested for infection  Work excuse today to return next week Monday  Your rapid strep test is negative, also urine shows no infection

## 2014-02-18 NOTE — Progress Notes (Signed)
   Subjective:    Patient ID: Denise Werner, female    DOB: 02/15/1955, 59 y.o.   MRN: [id number]  HPI 3 day h./o acute onset of chills, fever, sore throat and generalized pain Also has a dry hacking  cough and and excessive fatigue. Has not left the bed since symptom onset No io other family member is sick 3 day h/o increased frequency and burning on urination   Review of Systems See HPI  Denies chest pains, palpitations and leg swelling Denies abdominal pain, nausea, vomiting,diarrhea or constipation.    Denies joint pain, swelling and limitation in mobility. Denies headaches, seizures, numbness, or tingling. Denies depression, anxiety or insomnia. Denies skin break down or rash.        Objective:   Physical Exam BP 118/78 mmHg  Pulse 92  Temp(Src) 99.2 F (37.3 C) (Oral)  Resp 16  Ht 5\' 6"  (1.676 m)  Wt 143 lb (64.864 kg)  BMI 23.09 kg/m2  SpO2 97% Patient alert and oriented and in no  Mild pulmonary distress.ill appearing  HEENT: No facial asymmetry, EOMI,   oropharynx erythematous and moist.No exudate.  Neck supple no JVD, no mass.bilateral adenitis. No sinus tenderness, TM clear bilateral,   Chest: decreased air entry, bilateral wheees , few crackles   CVS: S1, S2 no murmurs, no S3.Regular rate.  ABD: Soft non tender.   Ext: No edema  MS: Adequate ROM spine, shoulders, hips and knees.  Skin: Intact, no ulcerations or rash noted.  Psych: Good eye contact, normal affect. Memory intact not anxious or depressed appearing.  CNS: CN 2-12 intact, power,  normal throughout.no focal deficits noted.        Assessment & Plan:  Influenza with respiratory manifestation other than pneumonia tamiflu prescribed   Cough variant asthma Current flare, neb treatment in the office , depo medrol and prednisone for 5 days   Interstitial cystitis Current flare, symptomatic with normal UA, pt plans to return to [facility] for bladder  instillation   Cough Worsened , since she is having asthma flare, phenergan DM prescribed for bedtime use   Acute bronchitis Rocephin in the office followed by clarithromycin, pt to call back if worsens , or if med is too expensive   HTN (hypertension) Controlled, no change in medication    Hyperlipemia Deteriorated, uncontrolled Dietary modification with no med change   Acute pharyngitis Rapid strep is negative

## 2014-02-20 DIAGNOSIS — J209 Acute bronchitis, unspecified: Secondary | ICD-10-CM | POA: Insufficient documentation

## 2014-02-20 DIAGNOSIS — J029 Acute pharyngitis, unspecified: Secondary | ICD-10-CM | POA: Insufficient documentation

## 2014-02-20 NOTE — Assessment & Plan Note (Signed)
tamiflu prescribed

## 2014-02-20 NOTE — Assessment & Plan Note (Signed)
Rapid strep is negative 

## 2014-02-20 NOTE — Assessment & Plan Note (Signed)
Deteriorated, uncontrolled Dietary modification with no med change

## 2014-02-20 NOTE — Assessment & Plan Note (Signed)
Worsened , since she is having asthma flare, phenergan DM prescribed for bedtime use

## 2014-02-20 NOTE — Assessment & Plan Note (Signed)
Controlled, no change in medication  

## 2014-02-20 NOTE — Assessment & Plan Note (Signed)
Current flare, symptomatic with normal UA, pt plans to return to [facility] for bladder instillation

## 2014-02-20 NOTE — Assessment & Plan Note (Signed)
Rocephin in the office followed by clarithromycin, pt to call back if worsens , or if med is too expensive

## 2014-02-20 NOTE — Assessment & Plan Note (Signed)
Current flare, neb treatment in the office , depo medrol and prednisone for 5 days

## 2014-03-08 NOTE — Telephone Encounter (Signed)
premarin cream in limited amt prescribed

## 2014-03-24 ENCOUNTER — Encounter (INDEPENDENT_AMBULATORY_CARE_PROVIDER_SITE_OTHER): Payer: Self-pay | Admitting: Internal Medicine

## 2014-03-24 ENCOUNTER — Ambulatory Visit (INDEPENDENT_AMBULATORY_CARE_PROVIDER_SITE_OTHER): Payer: BC Managed Care – PPO | Admitting: Internal Medicine

## 2014-03-24 VITALS — BP 128/70 | HR 64 | Temp 97.8°F | Ht 66.0 in | Wt 142.4 lb

## 2014-03-24 DIAGNOSIS — R748 Abnormal levels of other serum enzymes: Secondary | ICD-10-CM

## 2014-03-24 DIAGNOSIS — K2100 Gastro-esophageal reflux disease with esophagitis, without bleeding: Secondary | ICD-10-CM

## 2014-03-24 MED ORDER — PANTOPRAZOLE SODIUM 40 MG PO TBEC: 40.0000 mg | DELAYED_RELEASE_TABLET | Freq: Every day | ORAL | Status: DC

## 2014-03-24 NOTE — Progress Notes (Addendum)
Subjective:    Patient ID: Denise Werner, female    DOB: 1955-08-11, 60 y.o.   MRN: [id number]  HPI Here today for f/u. Last seen in August of 2015.  Hx of mildly elevated transaminases and work up and subsequent transaminase's were normal.  Autoimmune markers were negative. Acute Hepatitis was negative.  Stopped the Imipramine and her nausea is some better.  Transaminases in January of this year  were normal.  Appetite is good for the most part. She has lost 3 pounds since her has last visit. She has reduced her sugars since she is a borderline diabetic. BMs are normal. No melena or BRRB. Acid reflux is better. She occasionally has acid reflux. Off Protonix.   CBC    Component Value Date/Time   WBC 6.0 10/10/2013 0738   RBC 4.39 10/10/2013 0738   RBC 4.39 10/10/2013 0738   HGB 12.0 10/10/2013 0738   HCT 36.9 10/10/2013 0738   PLT 278 10/10/2013 0738   MCV 84.1 10/10/2013 0738   MCH 27.3 10/10/2013 0738   MCHC 32.5 10/10/2013 0738   RDW 14.3 10/10/2013 0738   LYMPHSABS 2.3 03/18/2013 0927   MONOABS 0.5 03/18/2013 0927   EOSABS 0.2 03/18/2013 0927   BASOSABS 0.0 03/18/2013 0927    CMP Latest Ref Rng 02/13/2014 10/10/2013 07/08/2013  Glucose 70 - 99 mg/dL 93 98 94  BUN 6 - 23 mg/dL 16 14 14   Creatinine 0.50 - 1.10 mg/dL 0.76 0.76 0.69  Sodium 135 - 145 mEq/L 138 139 141  Potassium 3.5 - 5.3 mEq/L 4.2 4.1 4.3  Chloride 96 - 112 mEq/L 102 104 106  CO2 19 - 32 mEq/L 26 27 28   Calcium 8.4 - 10.5 mg/dL 9.2 9.3 9.3  Total Protein 6.0 - 8.3 g/dL 7.0 7.0 6.7  Total Bilirubin 0.2 - 1.2 mg/dL 0.4 0.2 0.4  Alkaline Phos 39 - 117 U/L 85 80 83  AST 0 - 37 U/L 17 21 24   ALT 0 - 35 U/L 16 20 24       05/01/2013: Procedure:   EGD with ED.  Indications:  Patient is 59 year old African female who presents with recurrent epigastric/right upper quadrant pain. She also complains of intermittent solid food dysphagia. She is on pantoprazole but control of heartburn. She has had gallbladder  removed about 10 years ago. H. pylori serology is negative   Impression: Mild changes of reflux esophagitis limited to GE junction. Small sliding hiatal hernia. Few small hyperplastic polyps at gastric body and fundus. No evidence of peptic ulcer disease or gastritis. Esophageal dilation resulted in small mucosal disruption at cervical esophagus indicative of 03/22/2013 [place] RUQ:  IMPRESSION: 1. No hepatic abnormality is seen. 2. Prior cholecystectomy.  Review of Systems Two children in good health  Past Medical History  Diagnosis Date  . Interstitial cystitis   . Chronic neck pain   . H/O hiatal hernia   . GERD (gastroesophageal reflux disease) OCCASIONAL  . Dyslipidemia   . Asthma   . Short of breath on exertion   . Frequency of urination   . Urgency of urination   . Nocturia   . Bladder pain     Past Surgical History  Procedure Laterality Date  . Appendectomy    . Finger surgery  10-13-2009    left 5th finger ARTHRODESIS  . Cysto/ hod/ instillation therapy  11-17-2008;   12-02-1999    INTERSTITIAL CYSTITIS  . Foot arthroplasty  05-03-2004    FIFTHE TOE RIGHT FOOT  .  Bunionectomy  04-14-2003    LEFT FOOT  W/ POST REMOVAL INTERNAL FIXATION DEVICE  VIA SILVER BUNIONECTOMY  . Cholecystectomy  1998  . Abdominal hysterectomy      W/ BILATERAL SALPINGOOPHECTOMY  . Knee arthroscopy  JAN 2013    LEFT KNEE  . Cysto with hydrodistension  09/12/2011    Procedure: CYSTOSCOPY/HYDRODISTENSION;  Surgeon: Ailene Rud, MD;  Location: [facility];  Service: Urology;  Laterality: N/A;  INSTILL M & P AND M & P  . Esophagogastroduodenoscopy N/A 05/01/2013    Procedure: ESOPHAGOGASTRODUODENOSCOPY (EGD);  Surgeon: Rogene Houston, MD;  [facility];  Service: Endoscopy;  Laterality: N/A;  100    Allergies  Allergen Reactions  . Latex Rash  . Hydrocodone Nausea And Vomiting  . Sulfonamide Derivatives     Current Outpatient Prescriptions on File Prior  to Visit  Medication Sig Dispense Refill  . alendronate (FOSAMAX) 70 MG tablet TAKE 1 TABLET EVERY 7 DAYS. TAKE WITH FUL GLASS OF WATER ON EMPTY STOMACH 4 tablet 3  . amLODipine (NORVASC) 5 MG tablet TAKE 1 TABLET BY MOUTH DAILY 30 tablet 9  . Calcium Carbonate-Vit D-Min (CALCIUM 1200) 1200-1000 MG-UNIT CHEW Chew 1 tablet by mouth daily.     . diazepam (VALIUM) 10 MG tablet     . gabapentin (NEURONTIN) 100 MG capsule Three capsules at bedtime , as needed, for tingling in right leg 90 capsule 3  . mometasone-formoterol (DULERA) 100-5 MCG/ACT AERO Inhale 2 puffs into the lungs as needed.    . rosuvastatin (CRESTOR) 10 MG tablet Take 1 tablet (10 mg total) by mouth daily. 30 tablet 4  . traMADol (ULTRAM) 50 MG tablet Take by mouth every 6 (six) hours as needed.     No current facility-administered medications on file prior to visit.           Objective:   Physical Exam  Filed Vitals:   03/24/14 0938  Height: 5\' 6"  (1.676 m)  Weight: 142 lb 6.4 oz (64.592 kg)   Alert and oriented. Skin warm and dry. Oral mucosa is moist.   . Sclera anicteric, conjunctivae is pink. Thyroid not enlarged. No cervical lymphadenopathy. Lungs clear. Heart regular rate and rhythm.  Abdomen is soft. Bowel sounds are positive. No hepatomegaly. No abdominal masses felt. No tenderness.  No edema to lower extremities.         Assessment & Plan:  GERD/ Needs to start Protonix back given hx of reflux esophagitis.  Nausea is much better with the Dicyclomine. Next colonoscopy 2020 Nausea is better.

## 2014-03-24 NOTE — Patient Instructions (Signed)
Continue the Protonix. OV in 1 year.  

## 2014-03-26 ENCOUNTER — Telehealth (INDEPENDENT_AMBULATORY_CARE_PROVIDER_SITE_OTHER): Payer: Self-pay | Admitting: *Deleted

## 2014-03-26 NOTE — Telephone Encounter (Signed)
PA was completed with [employer] @ [phone] Pantoprazole 40 mg take 1 by mouth daily #90 3 refills was approved for 1 year. Confirmation Number [id number] Claim [id number]. [facility]

## 2014-05-08 ENCOUNTER — Telehealth: Payer: Self-pay | Admitting: Family Medicine

## 2014-05-08 NOTE — Telephone Encounter (Signed)
Pls request printed copy of her 07/2013 dexa be sent , I am having trouble reviewing the report in EMR. She may be better getting IV reclast annually versus oral fosamax, need to look further at this as she has reflux  Will need to follow through on this please

## 2014-05-26 ENCOUNTER — Other Ambulatory Visit (INDEPENDENT_AMBULATORY_CARE_PROVIDER_SITE_OTHER): Payer: Self-pay | Admitting: Internal Medicine

## 2014-05-26 ENCOUNTER — Telehealth (INDEPENDENT_AMBULATORY_CARE_PROVIDER_SITE_OTHER): Payer: Self-pay | Admitting: *Deleted

## 2014-05-26 DIAGNOSIS — R110 Nausea: Secondary | ICD-10-CM

## 2014-05-26 MED ORDER — ONDANSETRON HCL 4 MG PO TABS: 4.0000 mg | ORAL_TABLET | Freq: Three times a day (TID) | ORAL | Status: DC | PRN

## 2014-05-26 NOTE — Telephone Encounter (Signed)
Am going to call in some Zofran and see if this helps.

## 2014-05-26 NOTE — Telephone Encounter (Signed)
Am going to try her on zofran q 8 hrs and see how she does; She has had nausea x 6 yrs.

## 2014-05-26 NOTE — Telephone Encounter (Signed)
Denise Werner said the medicines [name] has her on are not working. She is nauseated from the time she gets up to the time she goes to bed. Her return phone number is [phone].

## 2014-05-29 ENCOUNTER — Other Ambulatory Visit: Payer: Self-pay | Admitting: Family Medicine

## 2014-06-01 ENCOUNTER — Telehealth: Payer: Self-pay | Admitting: Family Medicine

## 2014-06-01 NOTE — Telephone Encounter (Signed)
Pls contact pt , and advise her that I am recommending she change from oral "bone builder" fosamax weekly to Iv form as she has reflux. I would like her to discontinue the fosamax , and at her next visit in mid May, after reviewing update lab, and discussing further with her, will arrange for IV annual medication I reviewed again the dexa with the radiologist, she does have thinned bones and has had a fracture so she should continue treatment of some form Her bone density had actually improved from -1.8 to -1.5 in 2015 She needs fasting lipid, cmp , HBa1C and vit D done approx 3 days before appt pls order and harrange to mail to he rp or whatever is  Most convenientplease

## 2014-06-02 ENCOUNTER — Encounter: Payer: Self-pay | Admitting: Family Medicine

## 2014-06-03 NOTE — Telephone Encounter (Signed)
I spoke directly with the pt today , she is aware of the need to stop fosamax. I will d/w endo best strategy moving fwd. Advised  Daily exercise , calcium and vit D

## 2014-06-16 ENCOUNTER — Other Ambulatory Visit: Payer: Self-pay

## 2014-06-16 DIAGNOSIS — E785 Hyperlipidemia, unspecified: Secondary | ICD-10-CM

## 2014-06-16 DIAGNOSIS — E559 Vitamin D deficiency, unspecified: Secondary | ICD-10-CM

## 2014-06-16 DIAGNOSIS — R7303 Prediabetes: Secondary | ICD-10-CM

## 2014-06-16 DIAGNOSIS — I10 Essential (primary) hypertension: Secondary | ICD-10-CM

## 2014-06-16 LAB — COMPREHENSIVE METABOLIC PANEL
ALBUMIN: 3.8 g/dL (ref 3.5–5.2)
ALT: 22 U/L (ref 0–35)
AST: 19 U/L (ref 0–37)
Alkaline Phosphatase: 79 U/L (ref 39–117)
BUN: 12 mg/dL (ref 6–23)
CALCIUM: 9.5 mg/dL (ref 8.4–10.5)
CHLORIDE: 103 meq/L (ref 96–112)
CO2: 28 mEq/L (ref 19–32)
Creat: 0.78 mg/dL (ref 0.50–1.10)
Glucose, Bld: 94 mg/dL (ref 70–99)
POTASSIUM: 4.1 meq/L (ref 3.5–5.3)
Sodium: 140 mEq/L (ref 135–145)
Total Bilirubin: 0.4 mg/dL (ref 0.2–1.2)
Total Protein: 6.8 g/dL (ref 6.0–8.3)

## 2014-06-16 LAB — LIPID PANEL
CHOL/HDL RATIO: 3.3 ratio
CHOLESTEROL: 180 mg/dL (ref 0–200)
HDL: 54 mg/dL (ref >=46)
LDL Cholesterol: 117 mg/dL — ABNORMAL HIGH (ref 0–99)
Triglycerides: 46 mg/dL (ref <150)
VLDL: 9 mg/dL (ref 0–40)

## 2014-06-17 ENCOUNTER — Ambulatory Visit (INDEPENDENT_AMBULATORY_CARE_PROVIDER_SITE_OTHER): Payer: BC Managed Care – PPO | Admitting: Family Medicine

## 2014-06-17 ENCOUNTER — Encounter: Payer: Self-pay | Admitting: Family Medicine

## 2014-06-17 VITALS — BP 132/84 | HR 86 | Resp 18 | Ht 66.0 in | Wt 146.1 lb

## 2014-06-17 DIAGNOSIS — R7303 Prediabetes: Secondary | ICD-10-CM

## 2014-06-17 DIAGNOSIS — D509 Iron deficiency anemia, unspecified: Secondary | ICD-10-CM

## 2014-06-17 DIAGNOSIS — R7309 Other abnormal glucose: Secondary | ICD-10-CM

## 2014-06-17 DIAGNOSIS — F411 Generalized anxiety disorder: Secondary | ICD-10-CM | POA: Diagnosis not present

## 2014-06-17 DIAGNOSIS — E785 Hyperlipidemia, unspecified: Secondary | ICD-10-CM

## 2014-06-17 DIAGNOSIS — Z9109 Other allergy status, other than to drugs and biological substances: Secondary | ICD-10-CM

## 2014-06-17 DIAGNOSIS — R05 Cough: Secondary | ICD-10-CM | POA: Diagnosis not present

## 2014-06-17 DIAGNOSIS — I10 Essential (primary) hypertension: Secondary | ICD-10-CM

## 2014-06-17 DIAGNOSIS — M8580 Other specified disorders of bone density and structure, unspecified site: Secondary | ICD-10-CM

## 2014-06-17 DIAGNOSIS — N3010 Interstitial cystitis (chronic) without hematuria: Secondary | ICD-10-CM

## 2014-06-17 DIAGNOSIS — J45991 Cough variant asthma: Secondary | ICD-10-CM

## 2014-06-17 DIAGNOSIS — Z91048 Other nonmedicinal substance allergy status: Secondary | ICD-10-CM

## 2014-06-17 DIAGNOSIS — R059 Cough, unspecified: Secondary | ICD-10-CM

## 2014-06-17 LAB — HEMOGLOBIN A1C
Hgb A1c MFr Bld: 6 % — ABNORMAL HIGH (ref <5.7)
MEAN PLASMA GLUCOSE: 126 mg/dL — AB (ref <117)

## 2014-06-17 LAB — VITAMIN D 25 HYDROXY (VIT D DEFICIENCY, FRACTURES): VIT D 25 HYDROXY: 51 ng/mL (ref 30–100)

## 2014-06-17 MED ORDER — SERTRALINE HCL 25 MG PO TABS
25.0000 mg | ORAL_TABLET | Freq: Every day | ORAL | Status: DC
Start: 2014-06-17 — End: 2015-01-24

## 2014-06-17 NOTE — Assessment & Plan Note (Addendum)
  Deteriorated Patient educated about the importance of limiting  Carbohydrate intake , the need to commit to daily physical activity for a minimum of 30 minutes , and to commit weight loss. The fact that changes in all these areas will reduce or eliminate all together the development of diabetes is stressed.   Diabetic Labs Latest Ref Rng 06/16/2014 02/13/2014 02/04/2014 10/10/2013 07/08/2013  HbA1c <5.7 % 6.0(H) 5.9(H) - 6.2(H) 5.9(H)  Chol 0 - 200 mg/dL 180 - 214(H) 171 176  HDL >=46 mg/dL 54 - 56 48 59  Calc LDL 0 - 99 mg/dL 117(H) - 148(H) 109(H) 108(H)  Triglycerides <150 mg/dL 46 - 51 70 47  Creatinine 0.50 - 1.10 mg/dL 0.78 0.76 - 0.76 0.69   BP/Weight 06/17/2014 03/24/2014 02/18/2014 12/23/2013 12/04/2013 0/05/7996 08/01/1585  Systolic BP 276 184 859 276 394 320 037  Diastolic BP 84 70 78 70 74 70 68  Wt. (Lbs) 146.08 142.4 143 146 145.12 145.1 147.12  BMI 23.59 22.99 23.09 23.58 23.43 23.07 23.39   No flowsheet data found.

## 2014-06-17 NOTE — Assessment & Plan Note (Signed)
No recent flares 

## 2014-06-17 NOTE — Patient Instructions (Addendum)
Annual physica in 4 months, call if you need me before  New for generalized anxiety is zoloft one daily   Congrats   On better cholesterol , blood pressure is good no med change  Change eating habits to improve blood sugar  It is important that you exercise regularly at least 30 minutes 5 times a week. If you develop chest pain, have severe difficulty breathing, or feel very tired, stop exercising immediately and seek medical attention    CBc, iron and ferritin, fasting lipid, cmp, HBA1C and TSH in 4 month  No fosamax

## 2014-06-17 NOTE — Assessment & Plan Note (Signed)
Trial of zoloft , with the hope that this also improved her daily nausea

## 2014-06-17 NOTE — Assessment & Plan Note (Addendum)
Fosamax discontinued in 2016, will focus on daily weight bearing exercise , calcium and vit D intake, rept dexa in 1 to 2 years

## 2014-06-17 NOTE — Assessment & Plan Note (Signed)
Improved Hyperlipidemia:Low fat diet discussed and encouraged.   Lipid Panel  Lab Results  Component Value Date   CHOL 180 06/16/2014   HDL 54 06/16/2014   LDLCALC 117* 06/16/2014   TRIG 46 06/16/2014   CHOLHDL 3.3 06/16/2014

## 2014-06-17 NOTE — Assessment & Plan Note (Signed)
Controlled with weekly immunotherapy

## 2014-06-17 NOTE — Assessment & Plan Note (Signed)
Controlled, no change in medication DASH diet and commitment to daily physical activity for a minimum of 30 minutes discussed and encouraged, as a part of hypertension management. The importance of attaining a healthy weight is also discussed.  BP/Weight 06/17/2014 03/24/2014 02/18/2014 12/23/2013 12/04/2013 03/09/9483 05/06/2701  Systolic BP 500 938 182 993 716 967 893  Diastolic BP 84 70 78 70 74 70 68  Wt. (Lbs) 146.08 142.4 143 146 145.12 145.1 147.12  BMI 23.59 22.99 23.09 23.58 23.43 23.07 23.39

## 2014-06-17 NOTE — Progress Notes (Signed)
Subjective:    Patient ID: Denise Werner, female    DOB: Oct 18, 1955, 59 y.o.   MRN: [id number]  HPI The PT is here for follow up and re-evaluation of chronic medical conditions, medication management and review of any available recent lab and radiology data.  Preventive health is updated, specifically  Cancer screening and Immunization.   Questions or concerns regarding consultations or procedures which the PT has had in the interim are  addressed. The PT denies any adverse reactions to current medications since the last visit.  There are no new concerns.  C/o chronic daily nausea, sometimes even awakens her from her sleep, has zofran for symptomatic use Exercises daily but eating is not structured in a healthy way, will work on this      Review of Systems See HPI Denies recent fever or chills. Denies sinus pressure, nasal congestion, ear pain or sore throat. Denies chest congestion, productive cough or wheezing. Denies chest pains, palpitations and leg swelling Denies abdominal pain,  vomiting,diarrhea or constipation.   Denies dysuria, frequency, hesitancy or incontinence. Denies joint pain, swelling and limitation in mobility. Denies headaches, seizures, numbness, or tingling. Denies depression, anxiety or insomnia. Denies skin break down or rash.        Objective:   Physical Exam  BP 132/84 mmHg  Pulse 86  Resp 18  Ht 5\' 6"  (1.676 m)  Wt 146 lb 1.3 oz (66.261 kg)  BMI 23.59 kg/m2  SpO2 98% Patient alert and oriented and in no cardiopulmonary distress.  HEENT: No facial asymmetry, EOMI,   oropharynx pink and moist.  Neck supple no JVD, no mass.  Chest: Clear to auscultation bilaterally.  CVS: S1, S2 no murmurs, no S3.Regular rate.  ABD: Soft non tender.   Ext: No edema  MS: Adequate ROM spine, shoulders, hips and knees.  Skin: Intact, no ulcerations or rash noted.  Psych: Good eye contact, normal affect. Memory intact not anxious or depressed  appearing.  CNS: CN 2-12 intact, power,  normal throughout.no focal deficits noted.       Assessment & Plan:  HTN (hypertension) Controlled, no change in medication DASH diet and commitment to daily physical activity for a minimum of 30 minutes discussed and encouraged, as a part of hypertension management. The importance of attaining a healthy weight is also discussed.  BP/Weight 06/17/2014 03/24/2014 02/18/2014 12/23/2013 12/04/2013 10/04/7167 07/07/8936  Systolic BP 101 751 025 852 778 242 353  Diastolic BP 84 70 78 70 74 70 68  Wt. (Lbs) 146.08 142.4 143 146 145.12 145.1 147.12  BMI 23.59 22.99 23.09 23.58 23.43 23.07 23.39         Cough variant asthma Controlled, no change in medication    Prediabetes  Deteriorated Patient educated about the importance of limiting  Carbohydrate intake , the need to commit to daily physical activity for a minimum of 30 minutes , and to commit weight loss. The fact that changes in all these areas will reduce or eliminate all together the development of diabetes is stressed.   Diabetic Labs Latest Ref Rng 06/16/2014 02/13/2014 02/04/2014 10/10/2013 07/08/2013  HbA1c <5.7 % 6.0(H) 5.9(H) - 6.2(H) 5.9(H)  Chol 0 - 200 mg/dL 180 - 214(H) 171 176  HDL >=46 mg/dL 54 - 56 48 59  Calc LDL 0 - 99 mg/dL 117(H) - 148(H) 109(H) 108(H)  Triglycerides <150 mg/dL 46 - 51 70 47  Creatinine 0.50 - 1.10 mg/dL 0.78 0.76 - 0.76 0.69   BP/Weight 06/17/2014 03/24/2014 02/18/2014  12/23/2013 12/04/2013 10/06/4381 08/31/8401  Systolic BP 754 360 677 034 035 248 185  Diastolic BP 84 70 78 70 74 70 68  Wt. (Lbs) 146.08 142.4 143 146 145.12 145.1 147.12  BMI 23.59 22.99 23.09 23.58 23.43 23.07 23.39   No flowsheet data found.      Osteopenia Fosamax discontinued in 2016, will focus on daily weight bearing exercise , calcium and vit D intake, rept dexa in 1 to 2 years   Hyperlipidemia LDL goal <100 Improved Hyperlipidemia:Low fat diet discussed and  encouraged.   Lipid Panel  Lab Results  Component Value Date   CHOL 180 06/16/2014   HDL 54 06/16/2014   LDLCALC 117* 06/16/2014   TRIG 46 06/16/2014   CHOLHDL 3.3 06/16/2014         GAD (generalized anxiety disorder) Trial of zoloft , with the hope that this also improved her daily nausea   Environmental allergies Controlled with weekly immunotherapy   Interstitial cystitis No recent flares

## 2014-06-17 NOTE — Assessment & Plan Note (Signed)
Controlled, no change in medication  

## 2014-06-18 ENCOUNTER — Other Ambulatory Visit: Payer: Self-pay | Admitting: Family Medicine

## 2014-06-21 ENCOUNTER — Other Ambulatory Visit: Payer: Self-pay | Admitting: Family Medicine

## 2014-08-18 ENCOUNTER — Encounter: Payer: Self-pay | Admitting: Family Medicine

## 2014-08-19 ENCOUNTER — Encounter: Payer: Self-pay | Admitting: Family Medicine

## 2014-08-19 ENCOUNTER — Ambulatory Visit (INDEPENDENT_AMBULATORY_CARE_PROVIDER_SITE_OTHER): Payer: BC Managed Care – PPO | Admitting: Family Medicine

## 2014-08-19 VITALS — BP 128/68 | HR 84 | Resp 18 | Ht 66.0 in | Wt 145.1 lb

## 2014-08-19 DIAGNOSIS — F411 Generalized anxiety disorder: Secondary | ICD-10-CM

## 2014-08-19 DIAGNOSIS — I10 Essential (primary) hypertension: Secondary | ICD-10-CM

## 2014-08-19 DIAGNOSIS — E785 Hyperlipidemia, unspecified: Secondary | ICD-10-CM | POA: Diagnosis not present

## 2014-08-19 DIAGNOSIS — R7309 Other abnormal glucose: Secondary | ICD-10-CM

## 2014-08-19 DIAGNOSIS — R7303 Prediabetes: Secondary | ICD-10-CM

## 2014-08-19 DIAGNOSIS — M5431 Sciatica, right side: Secondary | ICD-10-CM | POA: Diagnosis not present

## 2014-08-19 DIAGNOSIS — Z9109 Other allergy status, other than to drugs and biological substances: Secondary | ICD-10-CM

## 2014-08-19 DIAGNOSIS — R110 Nausea: Secondary | ICD-10-CM

## 2014-08-19 DIAGNOSIS — Z114 Encounter for screening for human immunodeficiency virus [HIV]: Secondary | ICD-10-CM

## 2014-08-19 DIAGNOSIS — J45991 Cough variant asthma: Secondary | ICD-10-CM

## 2014-08-19 DIAGNOSIS — Z91048 Other nonmedicinal substance allergy status: Secondary | ICD-10-CM

## 2014-08-19 DIAGNOSIS — K219 Gastro-esophageal reflux disease without esophagitis: Secondary | ICD-10-CM

## 2014-08-19 MED ORDER — METHYLPREDNISOLONE ACETATE 80 MG/ML IJ SUSP
80.0000 mg | Freq: Once | INTRAMUSCULAR | Status: AC
  Administered 2014-08-19: 80 mg via INTRAMUSCULAR

## 2014-08-19 MED ORDER — KETOROLAC TROMETHAMINE 60 MG/2ML IM SOLN
60.0000 mg | Freq: Once | INTRAMUSCULAR | Status: AC
  Administered 2014-08-19: 60 mg via INTRAMUSCULAR

## 2014-08-19 MED ORDER — PREDNISONE 5 MG (21) PO TBPK: 5.0000 mg | ORAL_TABLET | ORAL | Status: DC

## 2014-08-19 MED ORDER — IBUPROFEN 800 MG PO TABS: 800.0000 mg | ORAL_TABLET | Freq: Three times a day (TID) | ORAL | Status: DC | PRN

## 2014-08-19 NOTE — Assessment & Plan Note (Signed)
Uncontrolled.Toradol and depo medrol administered IM in the office , to be followed by a short course of oral prednisone and NSAIDS.  

## 2014-08-19 NOTE — Patient Instructions (Addendum)
Annual physical in October as before, call if you need me sooner  You have acute sciatica, numbness and pain should be gone in the next 5 to 7 days, call if not.  Injections today in office and 2 meds sent in PLEASE take gabapentin 100 mg three at bedtime to help with the pain so that you sleep  Fasting lipid, cmp and eGFr, hBA1C, CBC, HIV, and TSH 5 days before October visit pls   Thanks for choosing [facility], we consider it a privelige to serve you.  Sciatica Sciatica is pain, weakness, numbness, or tingling along the path of the sciatic nerve. The nerve starts in the lower back and runs down the back of each leg. The nerve controls the muscles in the lower leg and in the back of the knee, while also providing sensation to the back of the thigh, lower leg, and the sole of your foot. Sciatica is a symptom of another medical condition. For instance, nerve damage or certain conditions, such as a herniated disk or bone spur on the spine, pinch or put pressure on the sciatic nerve. This causes the pain, weakness, or other sensations normally associated with sciatica. Generally, sciatica only affects one side of the body. CAUSES   Herniated or slipped disc.  Degenerative disk disease.  A pain disorder involving the narrow muscle in the buttocks (piriformis syndrome).  Pelvic injury or fracture.  Pregnancy.  Tumor (rare). SYMPTOMS  Symptoms can vary from mild to very severe. The symptoms usually travel from the low back to the buttocks and down the back of the leg. Symptoms can include:  Mild tingling or dull aches in the lower back, leg, or hip.  Numbness in the back of the calf or sole of the foot.  Burning sensations in the lower back, leg, or hip.  Sharp pains in the lower back, leg, or hip.  Leg weakness.  Severe back pain inhibiting movement. These symptoms may get worse with coughing, sneezing, laughing, or prolonged sitting or standing. Also, being  overweight may worsen symptoms. DIAGNOSIS  Your caregiver will perform a physical exam to look for common symptoms of sciatica. He or she may ask you to do certain movements or activities that would trigger sciatic nerve pain. Other tests may be performed to find the cause of the sciatica. These may include:  Blood tests.  X-rays.  Imaging tests, such as an MRI or CT scan. TREATMENT  Treatment is directed at the cause of the sciatic pain. Sometimes, treatment is not necessary and the pain and discomfort goes away on its own. If treatment is needed, your caregiver may suggest:  Over-the-counter medicines to relieve pain.  Prescription medicines, such as anti-inflammatory medicine, muscle relaxants, or narcotics.  Applying heat or ice to the painful area.  Steroid injections to lessen pain, irritation, and inflammation around the nerve.  Reducing activity during periods of pain.  Exercising and stretching to strengthen your abdomen and improve flexibility of your spine. Your caregiver may suggest losing weight if the extra weight makes the back pain worse.  Physical therapy.  Surgery to eliminate what is pressing or pinching the nerve, such as a bone spur or part of a herniated disk. HOME CARE INSTRUCTIONS   Only take over-the-counter or prescription medicines for pain or discomfort as directed by your caregiver.  Apply ice to the affected area for 20 minutes, 3-4 times a day for the first 48-72 hours. Then try heat in the same way.  Exercise,  stretch, or perform your usual activities if these do not aggravate your pain.  Attend physical therapy sessions as directed by your caregiver.  Keep all follow-up appointments as directed by your caregiver.  Do not wear high heels or shoes that do not provide proper support.  Check your mattress to see if it is too soft. A firm mattress may lessen your pain and discomfort. SEEK IMMEDIATE MEDICAL CARE IF:   You lose control of your  bowel or bladder (incontinence).  You have increasing weakness in the lower back, pelvis, buttocks, or legs.  You have redness or swelling of your back.  You have a burning sensation when you urinate.  You have pain that gets worse when you lie down or awakens you at night.  Your pain is worse than you have experienced in the past.  Your pain is lasting longer than 4 weeks.  You are suddenly losing weight without reason. MAKE SURE YOU:  Understand these instructions.  Will watch your condition.  Will get help right away if you are not doing well or get worse. Document Released: 01/11/2001 Document Revised: 07/19/2011 Document Reviewed: 05/29/2011 [facility] Patient Information 2015 [place], [place]. This information is not intended to replace advice given to you by your health care provider. Make sure you discuss any questions you have with your health care provider.   Left leg fullness appears benign , will watch

## 2014-08-20 DIAGNOSIS — R110 Nausea: Secondary | ICD-10-CM | POA: Insufficient documentation

## 2014-08-20 NOTE — Assessment & Plan Note (Signed)
Improved, and on no medication

## 2014-08-20 NOTE — Assessment & Plan Note (Signed)
Patient educated about the importance of limiting  Carbohydrate intake , the need to commit to daily physical activity for a minimum of 30 minutes , and to commit weight loss. The fact that changes in all these areas will reduce or eliminate all together the development of diabetes is stressed.   Diabetic Labs Latest Ref Rng 06/16/2014 02/13/2014 02/04/2014 10/10/2013 07/08/2013  HbA1c <5.7 % 6.0(H) 5.9(H) - 6.2(H) 5.9(H)  Chol 0 - 200 mg/dL 180 - 214(H) 171 176  HDL >=46 mg/dL 54 - 56 48 59  Calc LDL 0 - 99 mg/dL 117(H) - 148(H) 109(H) 108(H)  Triglycerides <150 mg/dL 46 - 51 70 47  Creatinine 0.50 - 1.10 mg/dL 0.78 0.76 - 0.76 0.69   BP/Weight 08/19/2014 06/17/2014 03/24/2014 02/18/2014 12/23/2013 32/07/7122 06/07/996  Systolic BP 338 250 539 767 341 937 902  Diastolic BP 68 84 70 78 70 74 70  Wt. (Lbs) 145.12 146.08 142.4 143 146 145.12 145.1  BMI 23.43 23.59 22.99 23.09 23.58 23.43 23.07   No flowsheet data found.   Updated lab needed at/ before next visit.

## 2014-08-20 NOTE — Assessment & Plan Note (Signed)
Improved , uses PPI as needed, she is to take daily for next 3 weeks

## 2014-08-20 NOTE — Assessment & Plan Note (Signed)
On immunotherapy and doing well

## 2014-08-20 NOTE — Assessment & Plan Note (Signed)
Controlled, no change in medication  

## 2014-08-20 NOTE — Assessment & Plan Note (Signed)
Improved, will use zofran as needed

## 2014-08-20 NOTE — Assessment & Plan Note (Signed)
1 week h/o acute symptom onset, after lifting grandchild and carrying her, weighs 25 pound, established dis disease and arthritis,  Uncontrolled.Toradol and depo medrol administered IM in the office , to be followed by a short course of oral prednisone and NSAIDS. Also take gabapentin 300 mg at bedtime Call if prolonged or worsening symptoms

## 2014-08-20 NOTE — Progress Notes (Signed)
Denise Werner     MRN: [id number]      DOB: 1955/06/06   HPI Denise Werner is here  With a 1  Week h/o low back pain radiating to right buttock down right lower extremity to foot, she reports numbnjess of the right leg, denies change in strength, or any incontinence. Lifted and walked with her grandchild last Wednesday, symptoms started after this. She has known arthritis and disc disease. Also has  follow up and re-evaluation of chronic medical conditions, medication management and review of any available recent lab and radiology data.  Preventive health is updated, specifically  Cancer screening and Immunization.      ROS Denies recent fever or chills. Denies sinus pressure, nasal congestion, ear pain or sore throat. Denies chest congestion, productive cough or wheezing. Denies chest pains, palpitations and leg swelling Denies abdominal pain,  vomiting,diarrhea or constipation.  Nausea is improved Denies dysuria, frequency, hesitancy or incontinence. . Denies headaches, seizures, numbness, or tingling. Denies depression, anxiety or insomnia. Denies skin break down or rash.   PE  BP 128/68 mmHg  Pulse 84  Resp 18  Ht 5\' 6"  (1.676 m)  Wt 145 lb 1.9 oz (65.826 kg)  BMI 23.43 kg/m2  SpO2 99%  Patient alert and oriented and in no cardiopulmonary distress.  HEENT: No facial asymmetry, EOMI,   oropharynx pink and moist.  Neck supple no JVD, no mass.  Chest: Clear to auscultation bilaterally.  CVS: S1, S2 no murmurs, no S3.Regular rate.  ABD: Soft non tender.   Ext: No edema  MS: Decreased  ROM lumbar  Spine, adequate in  shoulders, hips and knees.  Skin: Intact, no ulcerations or rash noted.  Psych: Good eye contact, normal affect. Memory intact not anxious or depressed appearing.  CNS: CN 2-12 intact, power,  normal throughout.reports decreased sensation in right lower extremity  Assessment & Plan   Back pain with right-sided sciatica Uncontrolled.Toradol and  depo medrol administered IM in the office , to be followed by a short course of oral prednisone and NSAIDS.   Sciatica of right side 1 week h/o acute symptom onset, after lifting grandchild and carrying her, weighs 25 pound, established dis disease and arthritis,  Uncontrolled.Toradol and depo medrol administered IM in the office , to be followed by a short course of oral prednisone and NSAIDS. Also take gabapentin 300 mg at bedtime Call if prolonged or worsening symptoms   HTN (hypertension) Controlled, no change in medication DASH diet and commitment to daily physical activity for a minimum of 30 minutes discussed and encouraged, as a part of hypertension management. The importance of attaining a healthy weight is also discussed.  BP/Weight 08/19/2014 06/17/2014 03/24/2014 02/18/2014 12/23/2013 61/10/5091 03/08/7122  Systolic BP 580 998 338 250 539 767 341  Diastolic BP 68 84 70 78 70 74 70  Wt. (Lbs) 145.12 146.08 142.4 143 146 145.12 145.1  BMI 23.43 23.59 22.99 23.09 23.58 23.43 23.07        Cough variant asthma Controlled, no change in medication   Prediabetes Patient educated about the importance of limiting  Carbohydrate intake , the need to commit to daily physical activity for a minimum of 30 minutes , and to commit weight loss. The fact that changes in all these areas will reduce or eliminate all together the development of diabetes is stressed.   Diabetic Labs Latest Ref Rng 06/16/2014 02/13/2014 02/04/2014 10/10/2013 07/08/2013  HbA1c <5.7 % 6.0(H) 5.9(H) - 6.2(H) 5.9(H)  Chol 0 -  200 mg/dL 180 - 214(H) 171 176  HDL >=46 mg/dL 54 - 56 48 59  Calc LDL 0 - 99 mg/dL 117(H) - 148(H) 109(H) 108(H)  Triglycerides <150 mg/dL 46 - 51 70 47  Creatinine 0.50 - 1.10 mg/dL 0.78 0.76 - 0.76 0.69   BP/Weight 08/19/2014 06/17/2014 03/24/2014 02/18/2014 12/23/2013 63/02/4968 03/09/3783  Systolic BP 885 027 741 287 867 672 094  Diastolic BP 68 84 70 78 70 74 70  Wt. (Lbs) 145.12 146.08 142.4 143  146 145.12 145.1  BMI 23.43 23.59 22.99 23.09 23.58 23.43 23.07   No flowsheet data found.   Updated lab needed at/ before next visit.   GAD (generalized anxiety disorder) Improved, and on no medication  Environmental allergies On immunotherapy and doing well  GERD (gastroesophageal reflux disease) Improved , uses PPI as needed, she is to take daily for next 3 weeks  Nausea without vomiting Improved, will use zofran as needed

## 2014-08-20 NOTE — Assessment & Plan Note (Signed)
Controlled, no change in medication DASH diet and commitment to daily physical activity for a minimum of 30 minutes discussed and encouraged, as a part of hypertension management. The importance of attaining a healthy weight is also discussed.  BP/Weight 08/19/2014 06/17/2014 03/24/2014 02/18/2014 12/23/2013 88/08/5795 03/10/2058  Systolic BP 156 153 794 327 614 709 295  Diastolic BP 68 84 70 78 70 74 70  Wt. (Lbs) 145.12 146.08 142.4 143 146 145.12 145.1  BMI 23.43 23.59 22.99 23.09 23.58 23.43 23.07

## 2014-08-26 ENCOUNTER — Other Ambulatory Visit: Payer: Self-pay | Admitting: Family Medicine

## 2014-08-26 ENCOUNTER — Encounter: Payer: Self-pay | Admitting: Family Medicine

## 2014-08-26 DIAGNOSIS — M5440 Lumbago with sciatica, unspecified side: Secondary | ICD-10-CM

## 2014-08-26 DIAGNOSIS — N3000 Acute cystitis without hematuria: Secondary | ICD-10-CM

## 2014-08-26 MED ORDER — LORAZEPAM 1 MG PO TABS: ORAL_TABLET | ORAL | Status: DC

## 2014-08-26 NOTE — Telephone Encounter (Signed)
As we just discussed, pls submit urine specimen at lab this afternoon to see if you have a urinary tract infection causing excess urination, an order has been sent in Take two gabapentin tonight for hopefully better pain relied, and you may need to increase to 3 tomorrow night and that is OK  Since the pain and numbness in right leg are essentially no better, and you are now having numbness in left leg since last night, I will need to get an MRI of your spine, this will be addressed in the morning and we hopefully can have the test done before the week has ended.  You report a  Feeling of incomplete emptying of the bladder since last night up to today, depending on what your urine shows , you may need to see the urologist for further evaluation of this problem.  I will continue to follow through this problem and new problems until they are resolved

## 2014-08-27 ENCOUNTER — Telehealth: Payer: Self-pay | Admitting: *Deleted

## 2014-08-27 ENCOUNTER — Encounter: Payer: Self-pay | Admitting: Family Medicine

## 2014-08-27 ENCOUNTER — Ambulatory Visit (HOSPITAL_COMMUNITY)
Admission: RE | Admit: 2014-08-27 | Discharge: 2014-08-27 | Disposition: A | Discharge status: Home / Self Care | Payer: BC Managed Care – PPO | Source: Ambulatory Visit | Attending: Family Medicine | Admitting: Family Medicine

## 2014-08-27 ENCOUNTER — Other Ambulatory Visit: Payer: Self-pay | Admitting: Family Medicine

## 2014-08-27 DIAGNOSIS — M79604 Pain in right leg: Secondary | ICD-10-CM

## 2014-08-27 DIAGNOSIS — M5440 Lumbago with sciatica, unspecified side: Secondary | ICD-10-CM

## 2014-08-27 DIAGNOSIS — M79662 Pain in left lower leg: Secondary | ICD-10-CM | POA: Insufficient documentation

## 2014-08-27 DIAGNOSIS — M79605 Pain in left leg: Secondary | ICD-10-CM

## 2014-08-27 DIAGNOSIS — M79661 Pain in right lower leg: Secondary | ICD-10-CM | POA: Insufficient documentation

## 2014-08-27 LAB — URINALYSIS, ROUTINE W REFLEX MICROSCOPIC
Bilirubin Urine: NEGATIVE
Glucose, UA: NEGATIVE
Hgb urine dipstick: NEGATIVE
Ketones, ur: NEGATIVE
LEUKOCYTES UA: NEGATIVE
NITRITE: NEGATIVE
Protein, ur: NEGATIVE
Specific Gravity, Urine: 1.006 (ref 1.001–1.035)
pH: 7 (ref 5.0–8.0)

## 2014-08-27 NOTE — Telephone Encounter (Signed)
I called pt and made her aware of her MRI appt at [facility] Thursday 09/04/14 at 7:00PM I made her aware to be there ar 6:30PM.

## 2014-08-27 NOTE — Telephone Encounter (Signed)
noted 

## 2014-08-27 NOTE — Telephone Encounter (Signed)
Error. Patient is going today

## 2014-08-27 NOTE — Telephone Encounter (Signed)
Pt is going to Denise Werner now for her [place].

## 2014-08-27 NOTE — Telephone Encounter (Signed)
I called and spoke with Karena Addison at [facility] radiology pt is scheduled for her [place] tomorrow 08/28/14 at 3:15. Pt is aware.

## 2014-08-28 ENCOUNTER — Other Ambulatory Visit (HOSPITAL_COMMUNITY): Payer: BC Managed Care – PPO

## 2014-08-28 LAB — URINE CULTURE

## 2014-08-29 ENCOUNTER — Encounter: Payer: Self-pay | Admitting: Family Medicine

## 2014-09-04 ENCOUNTER — Ambulatory Visit (HOSPITAL_COMMUNITY)
Admission: RE | Admit: 2014-09-04 | Discharge: 2014-09-04 | Disposition: A | Discharge status: Home / Self Care | Payer: BC Managed Care – PPO | Source: Ambulatory Visit | Attending: Family Medicine | Admitting: Family Medicine

## 2014-09-04 DIAGNOSIS — R200 Anesthesia of skin: Secondary | ICD-10-CM | POA: Insufficient documentation

## 2014-09-04 DIAGNOSIS — M4686 Other specified inflammatory spondylopathies, lumbar region: Secondary | ICD-10-CM | POA: Diagnosis not present

## 2014-09-04 DIAGNOSIS — M5126 Other intervertebral disc displacement, lumbar region: Secondary | ICD-10-CM | POA: Diagnosis not present

## 2014-09-04 DIAGNOSIS — M545 Low back pain: Secondary | ICD-10-CM | POA: Diagnosis present

## 2014-09-05 ENCOUNTER — Encounter: Payer: Self-pay | Admitting: Family Medicine

## 2014-09-05 ENCOUNTER — Other Ambulatory Visit: Payer: Self-pay | Admitting: Family Medicine

## 2014-09-05 DIAGNOSIS — G8929 Other chronic pain: Secondary | ICD-10-CM

## 2014-09-05 DIAGNOSIS — M5416 Radiculopathy, lumbar region: Principal | ICD-10-CM

## 2014-09-07 ENCOUNTER — Other Ambulatory Visit: Payer: Self-pay | Admitting: Family Medicine

## 2014-09-07 ENCOUNTER — Encounter: Payer: Self-pay | Admitting: Family Medicine

## 2014-09-08 ENCOUNTER — Encounter: Payer: Self-pay | Admitting: Family Medicine

## 2014-09-08 ENCOUNTER — Other Ambulatory Visit: Payer: Self-pay | Admitting: Family Medicine

## 2014-09-08 MED ORDER — GABAPENTIN 100 MG PO CAPS: 100.0000 mg | ORAL_CAPSULE | Freq: Three times a day (TID) | ORAL | Status: DC

## 2014-09-13 ENCOUNTER — Telehealth: Payer: Self-pay | Admitting: Family Medicine

## 2014-09-13 MED ORDER — PREDNISONE 10 MG PO TABS: 10.0000 mg | ORAL_TABLET | Freq: Two times a day (BID) | ORAL | Status: DC

## 2014-09-13 NOTE — Telephone Encounter (Signed)
Call made to pt in pm of 08/12 after finding out that pt had still not been given an appt for pain management, she c/o numbness between 1st and 2nd toe on rigth, prednoisone 10 mg twice daily for 5 days called to her pharmacy. Will f/u on appt next week, she is aware

## 2014-09-15 ENCOUNTER — Telehealth: Payer: Self-pay | Admitting: Family Medicine

## 2014-09-15 ENCOUNTER — Other Ambulatory Visit: Payer: Self-pay | Admitting: Family Medicine

## 2014-09-15 DIAGNOSIS — M549 Dorsalgia, unspecified: Principal | ICD-10-CM

## 2014-09-15 DIAGNOSIS — G8929 Other chronic pain: Secondary | ICD-10-CM

## 2014-09-15 NOTE — Telephone Encounter (Signed)
Spoke directly with the patient , she understands we can get no access to Dr Ace Gins, have tried with no success for over 1 week, she stated today that she never filled the prednisone on the weekend , does noty feel that she needs it, her thigh pain is less I explained we are trying other offices, and not to expect to get an appt for approx 1 month, pls call and refer to Dr Lyla Son, if Dr Unice Bailey office calls back then see if she will see her sooner, so in other words either office which will see her first

## 2014-09-16 NOTE — Telephone Encounter (Signed)
I called and spoke with Larene Beach at Dr. Franki Cabot office they have the referral on this patient and she told me their referral process, Dr. Lyla Son will review it and if he approves he will give it back to her to schedule the patient, and if it is approved they will call patient and schedule her, it may be next week that she is called.

## 2014-09-16 NOTE — Telephone Encounter (Signed)
Referral has been sent to Dr. Lyla Son.

## 2014-09-16 NOTE — Addendum Note (Signed)
Addended by: Denman George B on: 09/16/2014 11:14 AM   Modules accepted: Orders

## 2014-09-17 ENCOUNTER — Encounter: Payer: Self-pay | Admitting: Family Medicine

## 2014-11-03 ENCOUNTER — Other Ambulatory Visit: Payer: Self-pay | Admitting: Family Medicine

## 2014-11-04 ENCOUNTER — Encounter: Payer: Self-pay | Admitting: Family Medicine

## 2014-11-04 ENCOUNTER — Ambulatory Visit (INDEPENDENT_AMBULATORY_CARE_PROVIDER_SITE_OTHER): Payer: BC Managed Care – PPO | Admitting: Family Medicine

## 2014-11-04 VITALS — BP 122/82 | HR 86 | Resp 18 | Ht 66.0 in | Wt 148.0 lb

## 2014-11-04 DIAGNOSIS — Z1211 Encounter for screening for malignant neoplasm of colon: Secondary | ICD-10-CM

## 2014-11-04 DIAGNOSIS — Z0000 Encounter for general adult medical examination without abnormal findings: Secondary | ICD-10-CM | POA: Diagnosis not present

## 2014-11-04 DIAGNOSIS — R7303 Prediabetes: Secondary | ICD-10-CM | POA: Diagnosis not present

## 2014-11-04 DIAGNOSIS — Z23 Encounter for immunization: Secondary | ICD-10-CM | POA: Insufficient documentation

## 2014-11-04 DIAGNOSIS — I10 Essential (primary) hypertension: Secondary | ICD-10-CM

## 2014-11-04 DIAGNOSIS — N3010 Interstitial cystitis (chronic) without hematuria: Secondary | ICD-10-CM

## 2014-11-04 DIAGNOSIS — E785 Hyperlipidemia, unspecified: Secondary | ICD-10-CM | POA: Diagnosis not present

## 2014-11-04 LAB — COMPREHENSIVE METABOLIC PANEL
ALK PHOS: 83 U/L (ref 33–130)
ALT: 21 U/L (ref 6–29)
AST: 21 U/L (ref 10–35)
Albumin: 4 g/dL (ref 3.6–5.1)
BUN: 13 mg/dL (ref 7–25)
CO2: 28 mmol/L (ref 20–31)
CREATININE: 0.77 mg/dL (ref 0.50–1.05)
Calcium: 9.2 mg/dL (ref 8.6–10.4)
Chloride: 107 mmol/L (ref 98–110)
GLUCOSE: 94 mg/dL (ref 65–99)
Potassium: 4.3 mmol/L (ref 3.5–5.3)
SODIUM: 142 mmol/L (ref 135–146)
TOTAL PROTEIN: 6.7 g/dL (ref 6.1–8.1)
Total Bilirubin: 0.4 mg/dL (ref 0.2–1.2)

## 2014-11-04 LAB — FERRITIN: Ferritin: 138 ng/mL (ref 10–291)

## 2014-11-04 LAB — CBC
HCT: 35.9 % — ABNORMAL LOW (ref 36.0–46.0)
Hemoglobin: 11.5 g/dL — ABNORMAL LOW (ref 12.0–15.0)
MCH: 27.3 pg (ref 26.0–34.0)
MCHC: 32 g/dL (ref 30.0–36.0)
MCV: 85.3 fL (ref 78.0–100.0)
MPV: 10.9 fL (ref 8.6–12.4)
Platelets: 289 10*3/uL (ref 150–400)
RBC: 4.21 MIL/uL (ref 3.87–5.11)
RDW: 14.2 % (ref 11.5–15.5)
WBC: 5.1 10*3/uL (ref 4.0–10.5)

## 2014-11-04 LAB — LIPID PANEL
Cholesterol: 126 mg/dL (ref 125–200)
HDL: 59 mg/dL (ref >=46)
LDL CALC: 60 mg/dL (ref <130)
TRIGLYCERIDES: 33 mg/dL (ref <150)
Total CHOL/HDL Ratio: 2.1 Ratio (ref <=5.0)
VLDL: 7 mg/dL (ref <30)

## 2014-11-04 LAB — HEMOGLOBIN A1C
Hgb A1c MFr Bld: 6.2 % — ABNORMAL HIGH (ref <5.7)
Mean Plasma Glucose: 131 mg/dL — ABNORMAL HIGH (ref <117)

## 2014-11-04 LAB — POC HEMOCCULT BLD/STL (OFFICE/1-CARD/DIAGNOSTIC): FECAL OCCULT BLD: NEGATIVE

## 2014-11-04 LAB — VITAMIN B12: VITAMIN B 12: 493 pg/mL (ref 211–911)

## 2014-11-04 LAB — IRON: IRON: 61 ug/dL (ref 45–160)

## 2014-11-04 LAB — TSH: TSH: 1.395 u[IU]/mL (ref 0.350–4.500)

## 2014-11-04 MED ORDER — SOLIFENACIN SUCCINATE 5 MG PO TABS: 5.0000 mg | ORAL_TABLET | Freq: Every day | ORAL | Status: DC

## 2014-11-04 NOTE — Progress Notes (Signed)
   Subjective:    Patient ID: Denise Werner, female    DOB: 1955/03/20, 59 y.o.   MRN: [id number]  HPI Patient is in for annual physical exam. No other health concerns are expressed or addressed at the visit. Recent labs, if available are reviewed. Immunization is reviewed , and  updated if needed.    Review of Systems See HPI     Objective:   Physical Exam BP 122/82 mmHg  Pulse 86  Resp 18  Ht 5\' 6"  (1.676 m)  Wt 148 lb 0.6 oz (67.151 kg)  BMI 23.91 kg/m2  SpO2 99%   Pleasant well nourished female, alert and oriented x 3, in no cardio-pulmonary distress. Afebrile. HEENT No facial trauma or asymetry. Sinuses non tender.  Extra occullar muscles intact. External ears normal, tympanic membranes clear. Oropharynx moist, no exudate, good dentition. Neck: supple, no adenopathy,JVD or thyromegaly.No bruits.  Chest: Clear to ascultation bilaterally.No crackles or wheezes. Non tender to palpation  Breast: No asymetry,no masses or lumps. No tenderness. No nipple discharge or inversion. No axillary or supraclavicular adenopathy  Cardiovascular system; Heart sounds normal,  S1 and  S2 ,no S3.  No murmur, or thrill. Apical beat not displaced Peripheral pulses normal.  Abdomen: Soft, non tender, no organomegaly or masses. No bruits. Bowel sounds normal. No guarding, tenderness or rebound.  Rectal:  Normal sphincter tone. No mass.No rectal masses.  Guaiac negative stool.  GU: External genitalia normal female genitalia , female distribution of hair. No lesions. Urethral meatus normal in size, no  Prolapse, no lesions visibly  Present. Bladder non tender. Vagina pink and moist , with no visible lesions , discharge present . Adequate pelvic support no  cystocele or rectocele noted  Uterus absent, no adnexal masses, no  adnexal tenderness.   Musculoskeletal exam: Full ROM of spine, hips , shoulders and knees. No deformity ,swelling or crepitus noted. No muscle  wasting or atrophy.   Neurologic: Cranial nerves 2 to 12 intact. Power, tone ,sensation and reflexes normal throughout. No disturbance in gait. No tremor.  Skin: Intact, no ulceration, erythema , scaling or rash noted. Pigmentation normal throughout  Psych; Normal mood and affect. Judgement and concentration normal        Assessment & Plan:  Annual physical exam Annual exam as documented. Counseling done  re healthy lifestyle involving commitment to 150 minutes exercise per week, heart healthy diet, and attaining healthy weight.The importance of adequate sleep also discussed. Regular seat belt use and home safety, is also discussed. Changes in health habits are decided on by the patient with goals and time frames  set for achieving them. Immunization and cancer screening needs are specifically addressed at this visit.   Interstitial cystitis Increase nocturia, wants to resume vesicare ,as needed, which has helped in the past  Need for prophylactic vaccination and inoculation against influenza After obtaining informed consent, the vaccine is  administered by LPN.

## 2014-11-04 NOTE — Patient Instructions (Addendum)
F/u in 5 month, call me if you need me before Congrats on excellent blood pressure and cholesterol, continue to be diligent about your diet  Pls sched and do mammogram  Vesicare 5 mg sent in for as needed use for urinary frequency  Gabapentin 100 mg one at bedtime is useful for pain and sleep    Thanks for choosing [facility], we consider it a privelige to serve you.   Fasting labs in 5  Month  Flu vaccine today

## 2014-11-04 NOTE — Assessment & Plan Note (Signed)
After obtaining informed consent, the vaccine is  administered by LPN.  

## 2014-11-04 NOTE — Assessment & Plan Note (Signed)
Increase nocturia, wants to resume vesicare ,as needed, which has helped in the past

## 2014-11-04 NOTE — Assessment & Plan Note (Signed)

## 2014-11-05 ENCOUNTER — Other Ambulatory Visit: Payer: Self-pay

## 2014-11-05 DIAGNOSIS — Z1231 Encounter for screening mammogram for malignant neoplasm of breast: Secondary | ICD-10-CM

## 2014-11-05 LAB — HIV ANTIBODY (ROUTINE TESTING W REFLEX): HIV 1&2 Ab, 4th Generation: NONREACTIVE

## 2014-11-20 ENCOUNTER — Telehealth: Payer: Self-pay

## 2014-11-20 MED ORDER — ZOLEDRONIC ACID 5 MG/100ML IV SOLN: 5.0000 mg | Freq: Once | INTRAVENOUS | Status: DC

## 2014-11-20 NOTE — Telephone Encounter (Signed)
Pt has had 2 fractures, finger, and toe, she has osteopenia and also has gERD, needs annual reclast

## 2014-11-24 ENCOUNTER — Ambulatory Visit
Admission: RE | Admit: 2014-11-24 | Discharge: 2014-11-24 | Disposition: A | Discharge status: Home / Self Care | Payer: BC Managed Care – PPO | Source: Ambulatory Visit

## 2014-11-24 DIAGNOSIS — Z1231 Encounter for screening mammogram for malignant neoplasm of breast: Secondary | ICD-10-CM

## 2014-11-25 ENCOUNTER — Encounter (HOSPITAL_COMMUNITY): Payer: Self-pay

## 2014-11-25 ENCOUNTER — Encounter (HOSPITAL_COMMUNITY)
Admission: RE | Admit: 2014-11-25 | Discharge: 2014-11-25 | Disposition: A | Discharge status: Home / Self Care | Payer: BC Managed Care – PPO | Source: Ambulatory Visit | Attending: Family Medicine | Admitting: Family Medicine

## 2014-11-25 DIAGNOSIS — M949 Disorder of cartilage, unspecified: Secondary | ICD-10-CM | POA: Diagnosis not present

## 2014-11-25 DIAGNOSIS — M899 Disorder of bone, unspecified: Secondary | ICD-10-CM | POA: Diagnosis present

## 2014-11-25 LAB — COMPREHENSIVE METABOLIC PANEL
ALT: 48 U/L (ref 14–54)
AST: 49 U/L — ABNORMAL HIGH (ref 15–41)
Albumin: 4 g/dL (ref 3.5–5.0)
Alkaline Phosphatase: 86 U/L (ref 38–126)
Anion gap: 6 (ref 5–15)
BILIRUBIN TOTAL: 0.9 mg/dL (ref 0.3–1.2)
BUN: 11 mg/dL (ref 6–20)
CALCIUM: 9.2 mg/dL (ref 8.9–10.3)
CO2: 26 mmol/L (ref 22–32)
Chloride: 108 mmol/L (ref 101–111)
Creatinine, Ser: 0.78 mg/dL (ref 0.44–1.00)
GFR calc Af Amer: >60 mL/min (ref >60)
Glucose, Bld: 95 mg/dL (ref 65–99)
POTASSIUM: 5 mmol/L (ref 3.5–5.1)
Sodium: 140 mmol/L (ref 135–145)
TOTAL PROTEIN: 7 g/dL (ref 6.5–8.1)

## 2014-11-25 MED ORDER — SODIUM CHLORIDE 0.9 % IV SOLN
Freq: Once | INTRAVENOUS | Status: AC
  Administered 2014-11-25: 09:00:00 via INTRAVENOUS

## 2014-11-25 MED ORDER — ZOLEDRONIC ACID 5 MG/100ML IV SOLN
5.0000 mg | Freq: Once | INTRAVENOUS | Status: AC
  Administered 2014-11-25: 5 mg via INTRAVENOUS
  Filled 2014-11-25: qty 100

## 2014-11-25 NOTE — Progress Notes (Signed)
Results for EBBIE, SORENSON (MRN [id number]) as of 11/25/2014 10:17  Ref. Range 11/25/2014 09:20  Sodium Latest Ref Range: 135-145 mmol/L 140  Potassium Latest Ref Range: 3.5-5.1 mmol/L 5.0  Chloride Latest Ref Range: 101-111 mmol/L 108  CO2 Latest Ref Range: 22-32 mmol/L 26  BUN Latest Ref Range: 6-20 mg/dL 11  Creatinine Latest Ref Range: 0.44-1.00 mg/dL 0.78  Calcium Latest Ref Range: 8.9-10.3 mg/dL 9.2  EGFR (Non-African Amer.) Latest Ref Range: >60 mL/min >60  EGFR (African American) Latest Ref Range: >60 mL/min >60  Glucose Latest Ref Range: 65-99 mg/dL 95  Anion gap Latest Ref Range: 5-15  6  Alkaline Phosphatase Latest Ref Range: 38-126 U/L 86  Albumin Latest Ref Range: 3.5-5.0 g/dL 4.0  AST Latest Ref Range: 15-41 U/L 49 (H)  ALT Latest Ref Range: 14-54 U/L 48  Total Protein Latest Ref Range: 6.5-8.1 g/dL 7.0  Total Bilirubin Latest Ref Range: 0.3-1.2 mg/dL 0.9

## 2014-12-02 ENCOUNTER — Ambulatory Visit (INDEPENDENT_AMBULATORY_CARE_PROVIDER_SITE_OTHER): Payer: BC Managed Care – PPO

## 2014-12-02 DIAGNOSIS — J301 Allergic rhinitis due to pollen: Secondary | ICD-10-CM | POA: Diagnosis not present

## 2014-12-07 ENCOUNTER — Telehealth: Payer: Self-pay | Admitting: Family Medicine

## 2014-12-07 MED ORDER — DIPHENOXYLATE-ATROPINE 2.5-0.025 MG PO TABS: 1.0000 | ORAL_TABLET | Freq: Four times a day (QID) | ORAL | Status: DC | PRN

## 2014-12-07 MED ORDER — ONDANSETRON 4 MG PO TBDP: 4.0000 mg | ORAL_TABLET | Freq: Three times a day (TID) | ORAL | Status: DC | PRN

## 2014-12-07 NOTE — Telephone Encounter (Signed)
pls call pt EARLY, see hoe she is doing re acute gastroenteritis . Offer IM zofran and stat chem 7 and EGFR, check and documen bP if she needs to come in pls, this will be nurse visit

## 2014-12-07 NOTE — Telephone Encounter (Signed)
Acute onset of nausea , vomit, and loose stools started at 11 pm last night Emesis approx 10 to 12 times since 11, last vomited about 1.5 min ago. Loose watery stool, 10 since last past 10 hrs. Denies fever or chills, one other family member ill, they did "eat out" yesterday Advised re importance of adequate hydration, and need to go to [facility] or eD if no improvement with symptomatic meds, zofran tabs and lomotil will be called in when pharmacy opens Work excuse to cover 11/8 and 11/9 offered also, stated she has "sick days" so, no problem

## 2014-12-08 NOTE — Telephone Encounter (Signed)
Patient is feeling better and Dr spoke with her this am

## 2014-12-11 ENCOUNTER — Encounter (INDEPENDENT_AMBULATORY_CARE_PROVIDER_SITE_OTHER): Payer: Self-pay | Admitting: *Deleted

## 2014-12-15 ENCOUNTER — Other Ambulatory Visit: Payer: Self-pay | Admitting: Family Medicine

## 2014-12-16 ENCOUNTER — Ambulatory Visit (INDEPENDENT_AMBULATORY_CARE_PROVIDER_SITE_OTHER): Payer: BC Managed Care – PPO

## 2014-12-16 DIAGNOSIS — J301 Allergic rhinitis due to pollen: Secondary | ICD-10-CM | POA: Diagnosis not present

## 2015-01-06 ENCOUNTER — Ambulatory Visit (INDEPENDENT_AMBULATORY_CARE_PROVIDER_SITE_OTHER): Payer: BC Managed Care – PPO | Admitting: Allergy and Immunology

## 2015-01-06 ENCOUNTER — Encounter: Payer: Self-pay | Admitting: Allergy and Immunology

## 2015-01-06 VITALS — BP 122/70 | HR 68 | Temp 97.8°F | Resp 18

## 2015-01-06 DIAGNOSIS — H1010 Acute atopic conjunctivitis, unspecified eye: Secondary | ICD-10-CM

## 2015-01-06 DIAGNOSIS — J309 Allergic rhinitis, unspecified: Secondary | ICD-10-CM

## 2015-01-06 DIAGNOSIS — J4530 Mild persistent asthma, uncomplicated: Secondary | ICD-10-CM

## 2015-01-06 DIAGNOSIS — J3089 Other allergic rhinitis: Secondary | ICD-10-CM | POA: Diagnosis not present

## 2015-01-06 MED ORDER — LEVALBUTEROL TARTRATE 45 MCG/ACT IN AERO: 2.0000 | INHALATION_SPRAY | RESPIRATORY_TRACT | Status: DC | PRN

## 2015-01-06 MED ORDER — MONTELUKAST SODIUM 10 MG PO TABS: 10.0000 mg | ORAL_TABLET | Freq: Every day | ORAL | Status: DC

## 2015-01-06 MED ORDER — EPINEPHRINE 0.3 MG/0.3ML IJ SOAJ: 0.3000 mg | Freq: Once | INTRAMUSCULAR | Status: DC

## 2015-01-06 NOTE — Progress Notes (Signed)
FOLLOW UP NOTE  RE: Denise Werner MRN: [id number] DOB: 08-Jun-1955 [facility] [place] [address] [place], [place] [address] Date of Office Visit: 01/06/2015  Subjective:  Denise Werner is a 59 y.o. female who presents today for Cough and Nasal Congestion   Assessment:   1. Mild persistent asthma, in no respiratory distress with clear lung exam and excellent in office spirometry with report of intermittent cough-likely multifactorial    2. Allergic rhinoconjunctivitis, on immunotherapy.   3.      Incomplete follow-up.  Plan:   Meds ordered this encounter  Medications  . EPINEPHrine (EPIPEN 2-PAK) 0.3 mg/0.3 mL IJ SOAJ injection    Sig: Inject 0.3 mLs (0.3 mg total) into the muscle once.    Dispense:  2 Device    Refill:  2  . levalbuterol (XOPENEX HFA) 45 MCG/ACT inhaler    Sig: Inhale 2 puffs into the lungs every 4 (four) hours as needed for wheezing (cough).    Dispense:  1 Inhaler    Refill:  1  . montelukast (SINGULAIR) 10 MG tablet    Sig: Take 1 tablet (10 mg total) by mouth at bedtime.    Dispense:  30 tablet    Refill:  5   Patient Instructions   1. Avoidance: significant temperature changes/irritant exposures. 2. Antihistamine: Allegra 180mg  by mouth once daily for runny nose or itching.     Stop Claritin 3. Nasal Spray: Rhinocort 1-2 spray(s) each nostril once daily each morning for stuffy nose or drainage.                  Stop Flonase 4. Inhalers:  Rescue: Xopenex HFA 2 puffs every 4hours as needed for cough or wheeze.       -May use 2 puffs 10-20 minutes prior to exercise. 5.  Epi-pen/Benadryl as needed. 6. Other: Begin Singulair 10mg  each evening. 7. Nasal Saline wash each day at bath/shower time and as needed. 8. Follow up Visit: 3 months or sooner if needed.   HPI: Denise Werner returns to the office reporting cough.  She has not been seen for office visit since 2014 as did not follow-up as had  been discussed at her last visit.  She has been receiving immunotherapy at every 2 weeks, without large local or systemic reactions and is pleased with the benefit. No Epi-pen use or recurring benadryl.  She reports intermittent congestion, rhinorrhea, post-nasal drip and cough.  There is no associated chest congestion, wheeze, difficulty in breathing or chest tightness.  She feels symptoms are greater in the morning.  She uses Flonase about 3 times a week and daily Claritin.  She rarely uses Xopenex and none recently for the cough.  However she reports last spring, she saw primary MD with recurring changes in her breathing and was prescribed Prednisone, Z-pack and started on Dulera.  She was not able to tolerate Dulera due to jitteriness, but symptoms improved 100% with occasional Xopenex use.  Current Medications: 1.  EpiPen/Benadryl as needed. 2.  Xopenex HFA as needed. 3.  Claritin 10 mg once daily. 4.  Flonase 1-2 sprays once daily as needed. 5.  Crestor daily. 6.  As needed Vesicare, Neurontin, Bentyl, Norvasc, and calcium.  Drug Allergies: Allergies  Allergen Reactions  . Latex Rash  . Hydrocodone Nausea And Vomiting    Also causes dizziness and states feels like she has an outer body experience.  . Sulfonamide Derivatives  Objective:   Filed Vitals:   01/06/15 1619  BP: 122/70  Pulse: 68  Temp: 97.8 F (36.6 C)  Resp: 18   Physical Exam  Constitutional: She is well-developed, well-nourished, and in no distress.  HENT:  Head: Atraumatic.  Right Ear: Tympanic membrane and ear canal normal.  Left Ear: Tympanic membrane and ear canal normal.  Nose: Mucosal edema present. No rhinorrhea. No epistaxis.  Mouth/Throat: Oropharynx is clear and moist and mucous membranes are normal. No oropharyngeal exudate, posterior oropharyngeal edema or posterior oropharyngeal erythema.  Neck: Neck supple.  Cardiovascular: Normal rate, S1 normal and S2 normal.   No murmur  heard. Pulmonary/Chest: Effort normal. She has no wheezes. She has no rhonchi. She has no rales.  Lymphadenopathy:    She has no cervical adenopathy.    Diagnostics: Spirometry:  FVC  2.72--95%, FEV1 2.34--110%.    [name] M. Ishmael Holter, MD  cc: Tula Nakayama, MD

## 2015-01-06 NOTE — Patient Instructions (Addendum)
Take Home Sheet  1. Avoidance: significant temperature changes/irritant exposures.   2. Antihistamine: Allegra 180mg  by mouth once daily for runny nose or itching.     Stop Claritin  3. Nasal Spray: Rhinocort 1-2 spray(s) each nostril once daily each morning for stuffy nose or drainage.                  Stop Flonase  4. Inhalers:  Rescue: Xopenex HFA 2 puffs every 4hours as needed for cough or wheeze.       -May use 2 puffs 10-20 minutes prior to exercise.  5.  Epi-pen/Benadryl as needed.   6. Other: Begin Singulair 10mg  each evening.   7. Nasal Saline wash each day at bath/shower time and as needed.   8. Follow up Visit: 3 months or sooner if needed.   Websites that have reliable Patient information: 1. American Academy of Asthma, Allergy, & Immunology: www.aaaai.org 2. Food Allergy Network: www.foodallergy.org 3. Mothers of Asthmatics: www.aanma.org 4. [place]: DiningCalendar.de 5. American College of Allergy, Asthma, & Immunology: https://robertson.info/ or www.acaai.org

## 2015-01-07 DIAGNOSIS — J301 Allergic rhinitis due to pollen: Secondary | ICD-10-CM | POA: Diagnosis not present

## 2015-01-20 ENCOUNTER — Ambulatory Visit (INDEPENDENT_AMBULATORY_CARE_PROVIDER_SITE_OTHER): Payer: BC Managed Care – PPO

## 2015-01-20 DIAGNOSIS — J309 Allergic rhinitis, unspecified: Secondary | ICD-10-CM

## 2015-01-21 ENCOUNTER — Ambulatory Visit: Payer: BC Managed Care – PPO | Admitting: Internal Medicine

## 2015-01-24 ENCOUNTER — Other Ambulatory Visit: Payer: Self-pay | Admitting: Family Medicine

## 2015-01-27 ENCOUNTER — Ambulatory Visit (INDEPENDENT_AMBULATORY_CARE_PROVIDER_SITE_OTHER): Payer: BC Managed Care – PPO | Admitting: Internal Medicine

## 2015-01-27 ENCOUNTER — Encounter: Payer: Self-pay | Admitting: Internal Medicine

## 2015-01-27 VITALS — BP 120/72 | HR 82 | Ht 66.0 in | Wt 147.0 lb

## 2015-01-27 DIAGNOSIS — I10 Essential (primary) hypertension: Secondary | ICD-10-CM | POA: Diagnosis not present

## 2015-01-27 DIAGNOSIS — E785 Hyperlipidemia, unspecified: Secondary | ICD-10-CM

## 2015-01-27 NOTE — Patient Instructions (Signed)
Your physician recommends that you schedule a follow-up appointment As Needed  Your physician recommends that you return for lab work in: March (Fasting)   Your physician recommends that you continue on your current medications as directed. Please refer to the Current Medication list given to you today.  Thank you for choosing [facility]!

## 2015-01-27 NOTE — Progress Notes (Signed)
Cardiology Office Note   Date:  01/27/2015   ID:  Denise, Werner 02/24/1955, MRN [id number]  PCP:  Tula Nakayama, MD  Cardiologist:   Dorris Carnes, MD   No chief complaint on file.     History of Present Illness: Denise Werner is a 59 y.o. female with a history of CP  I saw hi initially in 2014 then again in 2015  Felt to be atypical  BP was elevated  Meds adjusted  BP on last visit good as were lipids   Since I saw her in 2015 she has done well  Occasional sharp shooting chest pains  Last seconds  Breahting is good  She is very active.      Current Outpatient Prescriptions  Medication Sig Dispense Refill  . amLODipine (NORVASC) 5 MG tablet TAKE 1 TABLET BY MOUTH EVERY DAY 30 tablet 6  . Calcium Carbonate-Vit D-Min (CALCIUM 1200) 1200-1000 MG-UNIT CHEW Chew 1 tablet by mouth daily.     . ciclesonide (ALVESCO) 160 MCG/ACT inhaler Inhale 1 puff into the lungs 2 (two) times daily.    Marland Kitchen dicyclomine (BENTYL) 10 MG capsule Take 10 mg by mouth 4 (four) times daily -  before meals and at bedtime.    . diphenoxylate-atropine (LOMOTIL) 2.5-0.025 MG tablet Take 1 tablet by mouth 4 (four) times daily as needed for diarrhea or loose stools. 30 tablet 0  . EPINEPHrine (EPIPEN 2-PAK) 0.3 mg/0.3 mL IJ SOAJ injection Inject 0.3 mLs (0.3 mg total) into the muscle once. 2 Device 2  . fluticasone (FLONASE) 50 MCG/ACT nasal spray Place 2 sprays into both nostrils daily as needed.     . gabapentin (NEURONTIN) 100 MG capsule Take 1 capsule (100 mg total) by mouth 3 (three) times daily. (Patient taking differently: Take 100 mg by mouth as needed. ) 90 capsule 3  . ibuprofen (ADVIL,MOTRIN) 800 MG tablet Take 800 mg by mouth as needed.     . levalbuterol (XOPENEX HFA) 45 MCG/ACT inhaler Inhale 2 puffs into the lungs every 4 (four) hours as needed for wheezing (cough). 1 Inhaler 1  . mometasone-formoterol (DULERA) 100-5 MCG/ACT AERO Inhale 2 puffs into the lungs as needed.    . montelukast  (SINGULAIR) 10 MG tablet Take 1 tablet (10 mg total) by mouth at bedtime. 30 tablet 5  . ondansetron (ZOFRAN) 4 MG tablet Take 1 tablet (4 mg total) by mouth every 8 (eight) hours as needed for nausea or vomiting. 50 tablet 1  . ondansetron (ZOFRAN-ODT) 4 MG disintegrating tablet Take 1 tablet (4 mg total) by mouth every 8 (eight) hours as needed for nausea or vomiting. 20 tablet 0  . pantoprazole (PROTONIX) 40 MG tablet Take 1 tablet (40 mg total) by mouth daily. 90 tablet 3  . rosuvastatin (CRESTOR) 10 MG tablet TAKE 1 TABLET (10 MG TOTAL) BY MOUTH DAILY. 30 tablet 4  . solifenacin (VESICARE) 5 MG tablet Take 1 tablet (5 mg total) by mouth daily. (Patient taking differently: Take 5 mg by mouth as needed. ) 30 tablet 5  . zoledronic acid (RECLAST) 5 MG/100ML SOLN injection Inject 100 mLs (5 mg total) into the vein once. 100 mL 0   No current facility-administered medications for this visit.    Allergies:   Latex; Hydrocodone; and Sulfonamide derivatives   Past Medical History  Diagnosis Date  . Interstitial cystitis   . Chronic neck pain   . H/O hiatal hernia   . GERD (gastroesophageal reflux disease) OCCASIONAL  .  Dyslipidemia   . Asthma   . Short of breath on exertion   . Frequency of urination   . Urgency of urination   . Nocturia   . Bladder pain     Past Surgical History  Procedure Laterality Date  . Appendectomy    . Finger surgery  10-13-2009    left 5th finger ARTHRODESIS  . Cysto/ hod/ instillation therapy  11-17-2008;   12-02-1999    INTERSTITIAL CYSTITIS  . Foot arthroplasty  05-03-2004    FIFTHE TOE RIGHT FOOT  . Bunionectomy  04-14-2003    LEFT FOOT  W/ POST REMOVAL INTERNAL FIXATION DEVICE  VIA SILVER BUNIONECTOMY  . Cholecystectomy  1998  . Abdominal hysterectomy      W/ BILATERAL SALPINGOOPHECTOMY  . Knee arthroscopy  JAN 2013    LEFT KNEE  . Cysto with hydrodistension  09/12/2011    Procedure: CYSTOSCOPY/HYDRODISTENSION;  Surgeon: Ailene Rud,  MD;  Location: [facility];  Service: Urology;  Laterality: N/A;  INSTILL M & P AND M & P  . Esophagogastroduodenoscopy N/A 05/01/2013    Procedure: ESOPHAGOGASTRODUODENOSCOPY (EGD);  Surgeon: Rogene Houston, MD;  Location: [facility];  Service: Endoscopy;  Laterality: N/A;  100     Social History:  The patient  reports that she has never smoked. She has never used smokeless tobacco. She reports that she does not drink alcohol or use illicit drugs.   Family History:  The patient's family history is not on file.    ROS:  Please see the history of present illness. All other systems are reviewed and  Negative to the above problem except as noted.    PHYSICAL EXAM: VS:  BP 120/72 mmHg  Pulse 82  Ht 5\' 6"  (1.676 m)  Wt 66.679 kg (147 lb)  BMI 23.74 kg/m2  SpO2 98%  GEN: Well nourished, well developed, in no acute distress HEENT: normal Neck: no JVD, carotid bruits, or masses Cardiac: RRR; no murmurs, rubs, or gallops,no edema  Respiratory:  clear to auscultation bilaterally, normal work of breathing GI: soft, nontender, nondistended, + BS  No hepatomegaly  MS: no deformity Moving all extremities   Skin: warm and dry, no rash Neuro:  Strength and sensation are intact Psych: euthymic mood, full affect   EKG:  EKG is ordered today.  SR 77 bpm  nonsepceicf ST T wave changes    Sl sagging of ST segments III, AVF, V4 to V6  Unchanged from previous     Lipid Panel    Component Value Date/Time   CHOL 126 11/03/2014 0740   TRIG 33 11/03/2014 0740   HDL 59 11/03/2014 0740   CHOLHDL 2.1 11/03/2014 0740   VLDL 7 11/03/2014 0740   LDLCALC 60 11/03/2014 0740      Wt Readings from Last 3 Encounters:  01/27/15 66.679 kg (147 lb)  11/25/14 67.132 kg (148 lb)  11/04/14 67.151 kg (148 lb 0.6 oz)      ASSESSMENT AND PLAN: 1  HTN  Excellent control  2  CP  Atypical  Prob musculoskeletal    3.  HL  Lipids are excellent  LDL 60  She could back off onCrestor and check  lipds in a few months  She has worked on her diet  Will be available as needed in the future  Will of course f/u with lipds in  March/ April      Signed, Dorris Carnes, MD  01/27/2015 2:22 PM    [facility] [place], [place], [place]  [address] Phone: [phone]; Fax: [phone]

## 2015-02-17 ENCOUNTER — Ambulatory Visit (INDEPENDENT_AMBULATORY_CARE_PROVIDER_SITE_OTHER): Payer: BC Managed Care – PPO

## 2015-02-17 DIAGNOSIS — J309 Allergic rhinitis, unspecified: Secondary | ICD-10-CM | POA: Diagnosis not present

## 2015-02-20 ENCOUNTER — Other Ambulatory Visit: Payer: Self-pay | Admitting: Family Medicine

## 2015-02-20 ENCOUNTER — Telehealth: Payer: Self-pay | Admitting: Family Medicine

## 2015-02-20 MED ORDER — PROMETHAZINE HCL 25 MG RE SUPP: 25.0000 mg | Freq: Four times a day (QID) | RECTAL | Status: DC | PRN

## 2015-02-20 NOTE — Telephone Encounter (Signed)
Nausea and loose stool x 24 hrs, requests medication for nasea, suppositories sent and she is awrae counseled re hand hygiene

## 2015-02-24 ENCOUNTER — Ambulatory Visit (INDEPENDENT_AMBULATORY_CARE_PROVIDER_SITE_OTHER): Payer: BC Managed Care – PPO

## 2015-02-24 DIAGNOSIS — J309 Allergic rhinitis, unspecified: Secondary | ICD-10-CM

## 2015-03-03 ENCOUNTER — Ambulatory Visit (INDEPENDENT_AMBULATORY_CARE_PROVIDER_SITE_OTHER): Payer: BC Managed Care – PPO

## 2015-03-03 DIAGNOSIS — J309 Allergic rhinitis, unspecified: Secondary | ICD-10-CM | POA: Diagnosis not present

## 2015-03-31 ENCOUNTER — Ambulatory Visit (INDEPENDENT_AMBULATORY_CARE_PROVIDER_SITE_OTHER): Payer: BC Managed Care – PPO

## 2015-03-31 DIAGNOSIS — J309 Allergic rhinitis, unspecified: Secondary | ICD-10-CM | POA: Diagnosis not present

## 2015-04-07 ENCOUNTER — Ambulatory Visit: Payer: BC Managed Care – PPO | Admitting: Allergy and Immunology

## 2015-04-21 ENCOUNTER — Ambulatory Visit (INDEPENDENT_AMBULATORY_CARE_PROVIDER_SITE_OTHER): Payer: BC Managed Care – PPO

## 2015-04-21 DIAGNOSIS — J309 Allergic rhinitis, unspecified: Secondary | ICD-10-CM | POA: Diagnosis not present

## 2015-04-28 ENCOUNTER — Ambulatory Visit (INDEPENDENT_AMBULATORY_CARE_PROVIDER_SITE_OTHER): Payer: BC Managed Care – PPO

## 2015-04-28 DIAGNOSIS — J309 Allergic rhinitis, unspecified: Secondary | ICD-10-CM

## 2015-05-05 ENCOUNTER — Ambulatory Visit (INDEPENDENT_AMBULATORY_CARE_PROVIDER_SITE_OTHER): Payer: BC Managed Care – PPO

## 2015-05-05 DIAGNOSIS — J309 Allergic rhinitis, unspecified: Secondary | ICD-10-CM | POA: Diagnosis not present

## 2015-05-19 ENCOUNTER — Ambulatory Visit (INDEPENDENT_AMBULATORY_CARE_PROVIDER_SITE_OTHER): Payer: BC Managed Care – PPO | Admitting: Internal Medicine

## 2015-05-19 ENCOUNTER — Encounter (INDEPENDENT_AMBULATORY_CARE_PROVIDER_SITE_OTHER): Payer: Self-pay | Admitting: Internal Medicine

## 2015-05-19 VITALS — BP 110/80 | HR 68 | Temp 97.4°F | Resp 18 | Ht 66.0 in | Wt 147.8 lb

## 2015-05-19 DIAGNOSIS — K589 Irritable bowel syndrome without diarrhea: Secondary | ICD-10-CM | POA: Diagnosis not present

## 2015-05-19 DIAGNOSIS — K2100 Gastro-esophageal reflux disease with esophagitis, without bleeding: Secondary | ICD-10-CM

## 2015-05-19 MED ORDER — DIPHENOXYLATE-ATROPINE 2.5-0.025 MG PO TABS: 1.0000 | ORAL_TABLET | Freq: Three times a day (TID) | ORAL | Status: DC | PRN

## 2015-05-19 MED ORDER — PANTOPRAZOLE SODIUM 40 MG PO TBEC: 40.0000 mg | DELAYED_RELEASE_TABLET | Freq: Every day | ORAL | Status: DC | PRN

## 2015-05-19 MED ORDER — DICYCLOMINE HCL 10 MG PO CAPS: 10.0000 mg | ORAL_CAPSULE | Freq: Three times a day (TID) | ORAL | Status: DC | PRN

## 2015-05-19 NOTE — Progress Notes (Signed)
Presenting complaint;  Follow-up for GERD and IBS.  Subjective:  Patient is 60 year old African-American female who is here for scheduled visit. She was last seen in February 2016. She ran out of her prescriptions but did not call for refills. She is watching her diet but she is having heartburn and burning in her throat at least 5 times a week. She has difficulty swallowing if she eats late and does not chew her food well. She has not had any episode of food impaction or regurgitation. She has good appetite and her weight is stable. On most that she has 2 bowel movements and every now and then she has 3. Most of her stools are formed. She occasionally has diarrhea with urgency. She denies abdominal pain melena or rectal bleeding or nocturnal bowel movement. She walks and exercises usually 3 times a week.   Current Medications: Outpatient Encounter Prescriptions as of 05/19/2015  Medication Sig  . amLODipine (NORVASC) 5 MG tablet TAKE 1 TABLET BY MOUTH EVERY DAY  . Calcium Carbonate-Vit D-Min (CALCIUM 1200) 1200-1000 MG-UNIT CHEW Chew 1 tablet by mouth daily.   . ciclesonide (ALVESCO) 160 MCG/ACT inhaler Inhale 1 puff into the lungs 2 (two) times daily.  . diphenoxylate-atropine (LOMOTIL) 2.5-0.025 MG tablet Take 1 tablet by mouth 4 (four) times daily as needed for diarrhea or loose stools.  Marland Kitchen EPINEPHrine (EPIPEN 2-PAK) 0.3 mg/0.3 mL IJ SOAJ injection Inject 0.3 mLs (0.3 mg total) into the muscle once.  . fluticasone (FLONASE) 50 MCG/ACT nasal spray Place 2 sprays into both nostrils daily as needed.   . gabapentin (NEURONTIN) 100 MG capsule Take 1 capsule (100 mg total) by mouth 3 (three) times daily. (Patient taking differently: Take 100 mg by mouth as needed. )  . ibuprofen (ADVIL,MOTRIN) 800 MG tablet Take 800 mg by mouth as needed.   . levalbuterol (XOPENEX HFA) 45 MCG/ACT inhaler Inhale 2 puffs into the lungs every 4 (four) hours as needed for wheezing (cough).  . mometasone-formoterol  (DULERA) 100-5 MCG/ACT AERO Inhale 2 puffs into the lungs as needed.  . ondansetron (ZOFRAN) 4 MG tablet Take 1 tablet (4 mg total) by mouth every 8 (eight) hours as needed for nausea or vomiting.  . pantoprazole (PROTONIX) 40 MG tablet Take 1 tablet (40 mg total) by mouth daily.  . promethazine (PHENERGAN) 25 MG suppository Place 1 suppository (25 mg total) rectally every 6 (six) hours as needed for nausea or vomiting.  . rosuvastatin (CRESTOR) 10 MG tablet TAKE 1 TABLET (10 MG TOTAL) BY MOUTH DAILY.  Marland Kitchen sertraline (ZOLOFT) 25 MG tablet TAKE 1 TABLET (25 MG TOTAL) BY MOUTH DAILY.  Marland Kitchen solifenacin (VESICARE) 5 MG tablet Take 1 tablet (5 mg total) by mouth daily. (Patient taking differently: Take 5 mg by mouth as needed. )  . zoledronic acid (RECLAST) 5 MG/100ML SOLN injection Inject 100 mLs (5 mg total) into the vein once.  . dicyclomine (BENTYL) 10 MG capsule Take 10 mg by mouth 4 (four) times daily -  before meals and at bedtime. Reported on 05/19/2015  . [DISCONTINUED] montelukast (SINGULAIR) 10 MG tablet Take 1 tablet (10 mg total) by mouth at bedtime. (Patient not taking: Reported on 05/19/2015)  . [DISCONTINUED] ondansetron (ZOFRAN-ODT) 4 MG disintegrating tablet Take 1 tablet (4 mg total) by mouth every 8 (eight) hours as needed for nausea or vomiting. (Patient not taking: Reported on 05/19/2015)   No facility-administered encounter medications on file as of 05/19/2015.     Objective: Blood pressure 110/80, pulse 68,  temperature 97.4 F (36.3 C), temperature source Oral, resp. rate 18, height 5\' 6"  (1.676 m), weight 147 lb 12.8 oz (67.042 kg). Patient is alert and in no acute distress. Conjunctiva is pink. Sclera is nonicteric Oropharyngeal mucosa is normal. No neck masses or thyromegaly noted. Cardiac exam with regular rhythm normal S1 and S2. No murmur or gallop noted. Lungs are clear to auscultation. Abdomen is full but soft and nontender without organomegaly or masses.  No LE edema or  clubbing noted.    Assessment:  #1. Gastroesophageal reflux disease. She's been off PPI and having typical and atypical symptoms. She needs to go back on therapy. Once symptoms are controlled she can try PPI 4 times a week or every other day. #2. Irritable bowel syndrome. She will continue high fiber diet and use dicyclomine and diphenoxylate on as-needed basis.   Plan:  New prescription given for pantoprazole 40 mg that she can take daily for few weeks and thereafter every other day on on demand. New prescription given for dicyclomine and diphenoxylate. If GERD symptoms are not well controlled with therapy she will call office otherwise return for OV in one year.

## 2015-05-19 NOTE — Progress Notes (Signed)
Pharmacy was called and she states that she had in her hand both the Pantoprazole , and Bentyl. We are needing to send the Lomotil again. Dr.Rehman was made aware.

## 2015-05-19 NOTE — Patient Instructions (Signed)
Take pantoprazole daily every other day or on as-needed basis

## 2015-05-20 ENCOUNTER — Ambulatory Visit (INDEPENDENT_AMBULATORY_CARE_PROVIDER_SITE_OTHER): Payer: BC Managed Care – PPO

## 2015-05-20 ENCOUNTER — Telehealth: Payer: Self-pay | Admitting: Family Medicine

## 2015-05-20 DIAGNOSIS — J209 Acute bronchitis, unspecified: Secondary | ICD-10-CM

## 2015-05-20 MED ORDER — CEFTRIAXONE SODIUM 500 MG IJ SOLR
500.0000 mg | Freq: Once | INTRAMUSCULAR | Status: AC
  Administered 2015-05-20: 500 mg via INTRAMUSCULAR

## 2015-05-20 MED ORDER — PENICILLIN V POTASSIUM 500 MG PO TABS: 500.0000 mg | ORAL_TABLET | Freq: Three times a day (TID) | ORAL | Status: DC

## 2015-05-20 MED ORDER — PROMETHAZINE-DM 6.25-15 MG/5ML PO SYRP: ORAL_SOLUTION | ORAL | Status: DC

## 2015-05-20 MED ORDER — BENZONATATE 100 MG PO CAPS: 100.0000 mg | ORAL_CAPSULE | Freq: Two times a day (BID) | ORAL | Status: DC | PRN

## 2015-05-20 NOTE — Telephone Encounter (Signed)
Pt in with 1 day h/o chills, yellow nasal drainage cough and yellow sputum Positive sick contact  Rocephin 500 mg IM administered by nurse. Patient is also afebrile  Pt being treated fro acute sinus infection and bronchitis She understands that if her condition worsens , she needs eval at urgent care

## 2015-05-20 NOTE — Telephone Encounter (Signed)
1 day h/o chills, and increased head congestion, yellow drainage, hoarse Needs nurse eval today.MD appt offered but pt declined twice, will assess at nurse check, if febrile will need mD eval

## 2015-05-20 NOTE — Progress Notes (Signed)
Patient instructed that meds had been sent to the pharmacy and pt declined xray and said if she gets worse she will go to the urgent care

## 2015-05-25 ENCOUNTER — Telehealth: Payer: Self-pay

## 2015-05-25 ENCOUNTER — Ambulatory Visit (HOSPITAL_COMMUNITY)
Admission: RE | Admit: 2015-05-25 | Discharge: 2015-05-25 | Disposition: A | Discharge status: Home / Self Care | Payer: BC Managed Care – PPO | Source: Ambulatory Visit | Attending: Family Medicine | Admitting: Family Medicine

## 2015-05-25 DIAGNOSIS — R05 Cough: Secondary | ICD-10-CM

## 2015-05-25 DIAGNOSIS — R059 Cough, unspecified: Secondary | ICD-10-CM

## 2015-05-25 NOTE — Telephone Encounter (Signed)
Order placed.  Will forward results to on call provider.

## 2015-05-25 NOTE — Progress Notes (Signed)
Pharmacy stated that they rec'd it.

## 2015-05-26 ENCOUNTER — Telehealth: Payer: Self-pay

## 2015-05-26 NOTE — Telephone Encounter (Signed)
Patient states her symptoms has worsened and is now wanting the chest xray. I spoke with her this am and reminded her of Dr Camillia Herter recommendation that if she worsened that she needed to go to the urgent care or the ER. Patient declined the xray when Dr was in the office and offered it Thursday and agreed to go to the UC if symptoms didn't improve in a few days. Patient coughing up green mucus and has been in contact with her allergist and is trying to get them to order the chest xray. She states she did go to the UC in [place] yesterday evening but they were so rude she left. I advised she try the [facility] and gave her the office hours. She will wait to hear back from them and told me if she worsens she will go to the ER.

## 2015-05-26 NOTE — Telephone Encounter (Signed)
I spoke to patient.  Per patient--she is currently on Penicillin and Tessalon for cough from Dr. Moshe Cipro.  She also received a steroid injection at that visit.  Dr. Moshe Cipro ordered a chest X-ray and did not sign off on it.  I explained to her that we could not sign the order and that she needs to call them back to rectify this situation.  There is not much more we can do since patient is already on an antibiotic and has already been seen by Dr. Moshe Cipro for this.  I advised patient to go to Urgent Care if getting worse.  Patient verbalizes understanding and will call Dr. Griffin Dakin office regarding chest X-ray and will go to Urgent Care if needed.

## 2015-05-26 NOTE — Telephone Encounter (Signed)
Patient is having some sinus issues. She is currently on prednisone and cough syrup. She went to get a chest x ray @ the hospital and was told the order had not been signed off on. She is wanting our office to send in an order for a chest x-ray.  Please Advise  Thanks

## 2015-05-27 NOTE — Addendum Note (Signed)
Addended by: Denman George B on: 05/27/2015 09:27 AM   Modules accepted: Orders

## 2015-06-23 ENCOUNTER — Ambulatory Visit (INDEPENDENT_AMBULATORY_CARE_PROVIDER_SITE_OTHER): Payer: BC Managed Care – PPO

## 2015-06-23 DIAGNOSIS — J309 Allergic rhinitis, unspecified: Secondary | ICD-10-CM | POA: Diagnosis not present

## 2015-06-30 ENCOUNTER — Ambulatory Visit (INDEPENDENT_AMBULATORY_CARE_PROVIDER_SITE_OTHER): Payer: BC Managed Care – PPO

## 2015-06-30 DIAGNOSIS — J309 Allergic rhinitis, unspecified: Secondary | ICD-10-CM

## 2015-07-04 ENCOUNTER — Other Ambulatory Visit: Payer: Self-pay | Admitting: Family Medicine

## 2015-07-13 ENCOUNTER — Telehealth: Payer: Self-pay | Admitting: Family Medicine

## 2015-07-13 DIAGNOSIS — E785 Hyperlipidemia, unspecified: Secondary | ICD-10-CM

## 2015-07-13 DIAGNOSIS — R7303 Prediabetes: Secondary | ICD-10-CM

## 2015-07-13 DIAGNOSIS — E559 Vitamin D deficiency, unspecified: Secondary | ICD-10-CM

## 2015-07-13 DIAGNOSIS — D508 Other iron deficiency anemias: Secondary | ICD-10-CM

## 2015-07-13 DIAGNOSIS — I10 Essential (primary) hypertension: Secondary | ICD-10-CM

## 2015-07-13 NOTE — Telephone Encounter (Addendum)
I spoke directly with the patient , she is aware of the fact that she needs an appt within the next 4 weeks,and labs  Pls order CBC and iron , ferritin , hBA1C, fasting lipid, cmp and Vit D, needs to get this 3 to 5 days  Before visit  Thanks

## 2015-07-14 ENCOUNTER — Ambulatory Visit (INDEPENDENT_AMBULATORY_CARE_PROVIDER_SITE_OTHER): Payer: BC Managed Care – PPO | Admitting: *Deleted

## 2015-07-14 DIAGNOSIS — J309 Allergic rhinitis, unspecified: Secondary | ICD-10-CM

## 2015-07-14 NOTE — Telephone Encounter (Signed)
Patient is scheduled for 08/03/15 at 9:00 patient aware

## 2015-07-14 NOTE — Addendum Note (Signed)
Addended by: Denman George B on: 07/14/2015 10:27 AM   Modules accepted: Orders

## 2015-08-01 ENCOUNTER — Other Ambulatory Visit: Payer: Self-pay | Admitting: Family Medicine

## 2015-08-03 ENCOUNTER — Ambulatory Visit: Payer: BC Managed Care – PPO | Admitting: Family Medicine

## 2015-08-03 LAB — COMPREHENSIVE METABOLIC PANEL
ALBUMIN: 4.1 g/dL (ref 3.6–5.1)
ALT: 16 U/L (ref 6–29)
AST: 19 U/L (ref 10–35)
Alkaline Phosphatase: 86 U/L (ref 33–130)
BUN: 13 mg/dL (ref 7–25)
CALCIUM: 8.9 mg/dL (ref 8.6–10.4)
CO2: 26 mmol/L (ref 20–31)
Chloride: 105 mmol/L (ref 98–110)
Creat: 0.73 mg/dL (ref 0.50–1.05)
GLUCOSE: 95 mg/dL (ref 65–99)
POTASSIUM: 4.2 mmol/L (ref 3.5–5.3)
Sodium: 140 mmol/L (ref 135–146)
Total Bilirubin: 0.4 mg/dL (ref 0.2–1.2)
Total Protein: 6.7 g/dL (ref 6.1–8.1)

## 2015-08-03 LAB — LIPID PANEL
CHOL/HDL RATIO: 2.9 ratio (ref <=5.0)
CHOLESTEROL: 154 mg/dL (ref 125–200)
HDL: 54 mg/dL (ref >=46)
LDL CALC: 89 mg/dL (ref <130)
TRIGLYCERIDES: 53 mg/dL (ref <150)
VLDL: 11 mg/dL (ref <30)

## 2015-08-03 LAB — CBC
HEMATOCRIT: 38.4 % (ref 35.0–45.0)
HEMOGLOBIN: 12.4 g/dL (ref 11.7–15.5)
MCH: 27.6 pg (ref 27.0–33.0)
MCHC: 32.3 g/dL (ref 32.0–36.0)
MCV: 85.5 fL (ref 80.0–100.0)
MPV: 10.7 fL (ref 7.5–12.5)
Platelets: 291 10*3/uL (ref 140–400)
RBC: 4.49 MIL/uL (ref 3.80–5.10)
RDW: 13.8 % (ref 11.0–15.0)
WBC: 5.7 10*3/uL (ref 3.8–10.8)

## 2015-08-03 LAB — IRON: IRON: 63 ug/dL (ref 45–160)

## 2015-08-03 LAB — HEMOGLOBIN A1C
Hgb A1c MFr Bld: 6 % — ABNORMAL HIGH (ref <5.7)
Mean Plasma Glucose: 126 mg/dL

## 2015-08-03 LAB — FERRITIN: FERRITIN: 132 ng/mL (ref 10–232)

## 2015-08-04 LAB — VITAMIN D 25 HYDROXY (VIT D DEFICIENCY, FRACTURES): Vit D, 25-Hydroxy: 37 ng/mL (ref 30–100)

## 2015-08-05 ENCOUNTER — Ambulatory Visit (INDEPENDENT_AMBULATORY_CARE_PROVIDER_SITE_OTHER): Payer: BC Managed Care – PPO | Admitting: Family Medicine

## 2015-08-05 ENCOUNTER — Encounter: Payer: Self-pay | Admitting: Family Medicine

## 2015-08-05 VITALS — BP 126/72 | HR 84 | Resp 18 | Ht 66.0 in | Wt 151.0 lb

## 2015-08-05 DIAGNOSIS — R7303 Prediabetes: Secondary | ICD-10-CM

## 2015-08-05 DIAGNOSIS — E785 Hyperlipidemia, unspecified: Secondary | ICD-10-CM

## 2015-08-05 DIAGNOSIS — M5431 Sciatica, right side: Secondary | ICD-10-CM | POA: Diagnosis not present

## 2015-08-05 DIAGNOSIS — J45991 Cough variant asthma: Secondary | ICD-10-CM

## 2015-08-05 DIAGNOSIS — I10 Essential (primary) hypertension: Secondary | ICD-10-CM | POA: Diagnosis not present

## 2015-08-05 DIAGNOSIS — Z91048 Other nonmedicinal substance allergy status: Secondary | ICD-10-CM

## 2015-08-05 DIAGNOSIS — N3010 Interstitial cystitis (chronic) without hematuria: Secondary | ICD-10-CM

## 2015-08-05 DIAGNOSIS — R05 Cough: Secondary | ICD-10-CM | POA: Diagnosis not present

## 2015-08-05 DIAGNOSIS — M8580 Other specified disorders of bone density and structure, unspecified site: Secondary | ICD-10-CM

## 2015-08-05 DIAGNOSIS — Z9109 Other allergy status, other than to drugs and biological substances: Secondary | ICD-10-CM

## 2015-08-05 DIAGNOSIS — R058 Other specified cough: Secondary | ICD-10-CM

## 2015-08-05 MED ORDER — CICLESONIDE 160 MCG/ACT IN AERS
1.0000 | INHALATION_SPRAY | Freq: Two times a day (BID) | RESPIRATORY_TRACT | Status: DC
Start: 2015-08-05 — End: 2016-10-11

## 2015-08-05 MED ORDER — AZELASTINE HCL 0.1 % NA SOLN: 2.0000 | Freq: Two times a day (BID) | NASAL | Status: DC

## 2015-08-05 MED ORDER — FLUTICASONE PROPIONATE 50 MCG/ACT NA SUSP: 2.0000 | Freq: Every day | NASAL | Status: DC

## 2015-08-05 MED ORDER — GABAPENTIN 100 MG PO CAPS: 100.0000 mg | ORAL_CAPSULE | Freq: Every day | ORAL | Status: DC

## 2015-08-05 MED ORDER — FLUTICASONE PROPIONATE 50 MCG/ACT NA SUSP
NASAL | Status: DC
Start: 2015-08-05 — End: 2015-08-05

## 2015-08-05 MED ORDER — METHYLPREDNISOLONE ACETATE 80 MG/ML IJ SUSP
80.0000 mg | Freq: Once | INTRAMUSCULAR | Status: AC
  Administered 2015-08-05: 80 mg via INTRAMUSCULAR

## 2015-08-05 MED ORDER — ROSUVASTATIN CALCIUM 10 MG PO TABS: 10.0000 mg | ORAL_TABLET | Freq: Every day | ORAL | Status: DC

## 2015-08-05 MED ORDER — LEVALBUTEROL TARTRATE 45 MCG/ACT IN AERO: 2.0000 | INHALATION_SPRAY | RESPIRATORY_TRACT | Status: DC | PRN

## 2015-08-05 NOTE — Progress Notes (Signed)
Subjective:    Patient ID: Denise Werner, female    DOB: 11-Nov-1955, 60 y.o.   MRN: [id number]  HPI   Denise Werner     MRN: [id number]      DOB: 1955-10-16   HPI Denise Werner is here for follow up and re-evaluation of chronic medical conditions, medication management and review of any available recent lab and radiology data.  Preventive health is updated, specifically  Cancer screening and Immunization.   Questions or concerns regarding consultations or procedures which the PT has had in the interim are  addressed. The PT denies any adverse reactions to current medications since the last visit.  C/o excess cough and "hawling" no fever or chills, not taking any allergy medication on a regular basis , despite being on immunotherapy  ROS Denies recent fever or chills.  Denies chest pains, palpitations and leg swelling Denies abdominal pain, nausea, vomiting,diarrhea or constipation.   Denies dysuria, frequency, hesitancy or incontinence. Denies uncontrolled  joint pain, swelling and limitation in mobility. Denies headaches, seizures, numbness, or tingling. Denies depression, anxiety or insomnia. Denies skin break down or rash.   PE  BP 126/72 mmHg  Pulse 84  Resp 18  Ht 5\' 6"  (1.676 m)  Wt 151 lb (68.493 kg)  BMI 24.38 kg/m2  SpO2 100%  Patient alert and oriented and in no cardiopulmonary distress.  HEENT: No facial asymmetry, EOMI,   oropharynx pink and moist.  Neck supple no JVD, no mass.No cervical adenopathy. Mild maxillary sinus tenderness, TM clear bilaterally Chest: Clear to auscultation bilaterally.  CVS: S1, S2 no murmurs, no S3.Regular rate.  ABD: Soft non tender.   Ext: No edema  MS: Adequate ROM spine, shoulders, hips and knees.  Skin: Intact, no ulcerations or rash noted.  Psych: Good eye contact, normal affect. Memory intact not anxious or depressed appearing.  CNS: CN 2-12 intact, power,  normal throughout.no focal deficits  noted.   Assessment & Plan  Cough variant asthma Uncontrolled with medication non compliuance. Re educated re the importance of same, depo medrol administered in office Needs to resume daily prophylactic inhaler, script sent and pt encouraged to ensure she keeps f/u with allergist  HTN (hypertension) Controlled, no change in medication DASH diet and commitment to daily physical activity for a minimum of 30 minutes discussed and encouraged, as a part of hypertension management. The importance of attaining a healthy weight is also discussed.  BP/Weight 08/05/2015 05/19/2015 01/27/2015 01/06/2015 11/25/2014 123456 [id number]  Systolic BP 123XX123 A999333 123456 123XX123 A999333 [id number] -  Diastolic BP 72 80 72 70 80 82 -  Wt. (Lbs) 151 147.8 147 - 148 148.04 145  BMI 24.38 23.87 23.74 - 23.9 23.91 23.41        Prediabetes Improved Denise Werner is reminded of the importance of commitment to daily physical activity for 30 minutes or more, as able and the need to limit carbohydrate intake to 30 to 60 grams per meal to help with blood sugar control.   The need to take medication as prescribed, test blood sugar as directed, and to call between visits if there is a concern that blood sugar is uncontrolled is also discussed.   Denise Werner is reminded of the importance of daily foot exam, annual eye examination, and good blood sugar, blood pressure and cholesterol control.  Diabetic Labs Latest Ref Rng 08/03/2015 11/25/2014 11/03/2014 06/16/2014 02/13/2014  HbA1c <5.7 % 6.0(H) - 6.2(H) 6.0(H) 5.9(H)  Chol 125 - 200 mg/dL 154 -  126 180 -  HDL >=46 mg/dL 54 - 59 54 -  Calc LDL <130 mg/dL 89 - 60 117(H) -  Triglycerides <150 mg/dL 53 - 33 46 -  Creatinine 0.50 - 1.05 mg/dL 0.73 0.78 0.77 0.78 0.76   BP/Weight 08/05/2015 05/19/2015 01/27/2015 01/06/2015 11/25/2014 123456 [id number]  Systolic BP 123XX123 A999333 123456 123XX123 A999333 [id number] -  Diastolic BP 72 80 72 70 80 82 -  Wt. (Lbs) 151 147.8 147 - 148 148.04 145  BMI 24.38 23.87 23.74 -  23.9 23.91 23.41   No flowsheet data found.       Hyperlipidemia LDL goal <100 Controlled, no change in medication Hyperlipidemia:Low fat diet discussed and encouraged.   Lipid Panel  Lab Results  Component Value Date   CHOL 154 08/03/2015   HDL 54 08/03/2015   LDLCALC 89 08/03/2015   TRIG 53 08/03/2015   CHOLHDL 2.9 08/03/2015        Interstitial cystitis Asymptomatic off of medication  Back pain with right-sided sciatica Controlled, no change in medication Encouraged to take gabapentin every night for best symptom control, including hot flashes  Environmental allergies Pt needs to commit to correct use of prescription med for control, currently uncontrolled, medications reviewed and  Re prescribed. Depo medrol administered in office   Osteopenia reclast due in Fall, she is aware and have this repeated, she has a;lready sustained 2 fractures       Review of Systems     Objective:   Physical Exam        Assessment & Plan:

## 2015-08-05 NOTE — Patient Instructions (Signed)
Annual physical exam early November, call if you need me sooner  Depo medrol injection in office today for allergies  You NEED to COMMIT to using medication for allergies EVERY DAY to control the chronic cough, also keep up with your allergy injections on schedule  PLEASE sumbit sputum for culture this week  If you continue to cough on daily medication, call next week, I will refer you for sinus xray then   Labs are excellent, you will need re[peat labs early next year, order will be given at next visit  Thank you  for choosing [facility]. We consider it a privelige to serve you.  Delivering excellent health care in a caring and  compassionate way is our goal.  Partnering with you,  so that together we can achieve this goal is our strategy.

## 2015-08-08 ENCOUNTER — Encounter: Payer: Self-pay | Admitting: Family Medicine

## 2015-08-08 NOTE — Assessment & Plan Note (Signed)
Improved Denise Werner is reminded of the importance of commitment to daily physical activity for 30 minutes or more, as able and the need to limit carbohydrate intake to 30 to 60 grams per meal to help with blood sugar control.   The need to take medication as prescribed, test blood sugar as directed, and to call between visits if there is a concern that blood sugar is uncontrolled is also discussed.   Denise Werner is reminded of the importance of daily foot exam, annual eye examination, and good blood sugar, blood pressure and cholesterol control.  Diabetic Labs Latest Ref Rng 08/03/2015 11/25/2014 11/03/2014 06/16/2014 02/13/2014  HbA1c <5.7 % 6.0(H) - 6.2(H) 6.0(H) 5.9(H)  Chol 125 - 200 mg/dL 154 - 126 180 -  HDL >=46 mg/dL 54 - 59 54 -  Calc LDL <130 mg/dL 89 - 60 117(H) -  Triglycerides <150 mg/dL 53 - 33 46 -  Creatinine 0.50 - 1.05 mg/dL 0.73 0.78 0.77 0.78 0.76   BP/Weight 08/05/2015 05/19/2015 01/27/2015 01/06/2015 11/25/2014 123456 [id number]  Systolic BP 123XX123 A999333 123456 123XX123 A999333 [id number] -  Diastolic BP 72 80 72 70 80 82 -  Wt. (Lbs) 151 147.8 147 - 148 148.04 145  BMI 24.38 23.87 23.74 - 23.9 23.91 23.41   No flowsheet data found.

## 2015-08-08 NOTE — Assessment & Plan Note (Signed)
Asymptomatic off of medication 

## 2015-08-08 NOTE — Assessment & Plan Note (Signed)
Pt needs to commit to correct use of prescription med for control, currently uncontrolled, medications reviewed and  Re prescribed. Depo medrol administered in office

## 2015-08-08 NOTE — Assessment & Plan Note (Signed)
reclast due in Fall, she is aware and have this repeated, she has a;lready sustained 2 fractures

## 2015-08-08 NOTE — Assessment & Plan Note (Signed)
Controlled, no change in medication Hyperlipidemia:Low fat diet discussed and encouraged.   Lipid Panel  Lab Results  Component Value Date   CHOL 154 08/03/2015   HDL 54 08/03/2015   LDLCALC 89 08/03/2015   TRIG 53 08/03/2015   CHOLHDL 2.9 08/03/2015

## 2015-08-08 NOTE — Assessment & Plan Note (Signed)
Controlled, no change in medication DASH diet and commitment to daily physical activity for a minimum of 30 minutes discussed and encouraged, as a part of hypertension management. The importance of attaining a healthy weight is also discussed.  BP/Weight 08/05/2015 05/19/2015 01/27/2015 01/06/2015 11/25/2014 123456 [id number]  Systolic BP 123XX123 A999333 123456 123XX123 A999333 [id number] -  Diastolic BP 72 80 72 70 80 82 -  Wt. (Lbs) 151 147.8 147 - 148 148.04 145  BMI 24.38 23.87 23.74 - 23.9 23.91 23.41

## 2015-08-08 NOTE — Assessment & Plan Note (Signed)
Uncontrolled with medication non compliuance. Re educated re the importance of same, depo medrol administered in office Needs to resume daily prophylactic inhaler, script sent and pt encouraged to ensure she keeps f/u with allergist

## 2015-08-08 NOTE — Assessment & Plan Note (Signed)
Controlled, no change in medication Encouraged to take gabapentin every night for best symptom control, including hot flashes

## 2015-08-18 ENCOUNTER — Ambulatory Visit: Payer: BC Managed Care – PPO | Admitting: Allergy and Immunology

## 2015-09-16 ENCOUNTER — Encounter: Payer: Self-pay | Admitting: Family Medicine

## 2015-09-16 ENCOUNTER — Other Ambulatory Visit: Payer: Self-pay | Admitting: Family Medicine

## 2015-09-16 MED ORDER — SOLIFENACIN SUCCINATE 5 MG PO TABS: 5.0000 mg | ORAL_TABLET | Freq: Every day | ORAL | 4 refills | Status: DC

## 2015-09-23 DIAGNOSIS — J3089 Other allergic rhinitis: Secondary | ICD-10-CM | POA: Diagnosis not present

## 2015-09-24 DIAGNOSIS — J301 Allergic rhinitis due to pollen: Secondary | ICD-10-CM | POA: Diagnosis not present

## 2015-10-31 ENCOUNTER — Other Ambulatory Visit: Payer: Self-pay | Admitting: Family Medicine

## 2015-11-04 ENCOUNTER — Other Ambulatory Visit: Payer: Self-pay | Admitting: Family Medicine

## 2015-11-04 DIAGNOSIS — Z1231 Encounter for screening mammogram for malignant neoplasm of breast: Secondary | ICD-10-CM

## 2015-11-30 ENCOUNTER — Ambulatory Visit
Admission: RE | Admit: 2015-11-30 | Discharge: 2015-11-30 | Disposition: A | Discharge status: Home / Self Care | Payer: BC Managed Care – PPO | Source: Ambulatory Visit | Attending: Family Medicine | Admitting: Family Medicine

## 2015-11-30 DIAGNOSIS — Z1231 Encounter for screening mammogram for malignant neoplasm of breast: Secondary | ICD-10-CM

## 2015-12-03 ENCOUNTER — Telehealth: Payer: Self-pay | Admitting: Family Medicine

## 2015-12-03 ENCOUNTER — Encounter: Payer: Self-pay | Admitting: Family Medicine

## 2015-12-03 ENCOUNTER — Ambulatory Visit (INDEPENDENT_AMBULATORY_CARE_PROVIDER_SITE_OTHER): Payer: BC Managed Care – PPO | Admitting: Family Medicine

## 2015-12-03 VITALS — BP 118/80 | HR 79 | Wt 157.0 lb

## 2015-12-03 DIAGNOSIS — E785 Hyperlipidemia, unspecified: Secondary | ICD-10-CM

## 2015-12-03 DIAGNOSIS — M546 Pain in thoracic spine: Secondary | ICD-10-CM | POA: Insufficient documentation

## 2015-12-03 DIAGNOSIS — R7303 Prediabetes: Secondary | ICD-10-CM | POA: Diagnosis not present

## 2015-12-03 DIAGNOSIS — I10 Essential (primary) hypertension: Secondary | ICD-10-CM

## 2015-12-03 MED ORDER — IBUPROFEN 800 MG PO TABS: 800.0000 mg | ORAL_TABLET | Freq: Three times a day (TID) | ORAL | 0 refills | Status: DC | PRN

## 2015-12-03 MED ORDER — PREDNISONE 5 MG (21) PO TBPK: 5.0000 mg | ORAL_TABLET | ORAL | 0 refills | Status: DC

## 2015-12-03 MED ORDER — TRAMADOL HCL 50 MG PO TABS: 50.0000 mg | ORAL_TABLET | Freq: Four times a day (QID) | ORAL | 0 refills | Status: DC | PRN

## 2015-12-03 MED ORDER — METHYLPREDNISOLONE ACETATE 80 MG/ML IJ SUSP
80.0000 mg | Freq: Once | INTRAMUSCULAR | Status: AC
  Administered 2015-12-03: 80 mg via INTRAMUSCULAR

## 2015-12-03 MED ORDER — CYCLOBENZAPRINE HCL 10 MG PO TABS: 10.0000 mg | ORAL_TABLET | Freq: Three times a day (TID) | ORAL | 0 refills | Status: DC | PRN

## 2015-12-03 MED ORDER — KETOROLAC TROMETHAMINE 60 MG/2ML IM SOLN
60.0000 mg | Freq: Once | INTRAMUSCULAR | Status: AC
  Administered 2015-12-03: 60 mg via INTRAMUSCULAR

## 2015-12-03 NOTE — Addendum Note (Signed)
Addended by: Eual Fines on: 12/03/2015 02:58 PM   Modules accepted: Orders

## 2015-12-03 NOTE — Assessment & Plan Note (Signed)
Controlled, no change in medication DASH diet and commitment to daily physical activity for a minimum of 30 minutes discussed and encouraged, as a part of hypertension management. The importance of attaining a healthy weight is also discussed.  BP/Weight 12/03/2015 08/05/2015 05/19/2015 01/27/2015 01/06/2015 11/25/2014 123456  Systolic BP 123456 123XX123 A999333 123456 123XX123 A999333 [id number]  Diastolic BP 80 72 80 72 70 80 82  Wt. (Lbs) 157 151 147.8 147 - 148 148.04  BMI 25.34 24.38 23.87 23.74 - 23.9 23.91

## 2015-12-03 NOTE — Patient Instructions (Signed)
F/u as before , call if you need me sooner  Toradol and depo medrol in office, followed by ibuprofen, prednisone, tramadol, and flexeril   Thank you  for choosing [facility]. We consider it a privelige to serve you.  Delivering excellent health care in a caring and  compassionate way is our goal.  Partnering with you,  so that together we can achieve this goal is our strategy.

## 2015-12-03 NOTE — Assessment & Plan Note (Addendum)
Uncontrolled.Toradol and depo medrol administered IM in the office , to be followed by a short course of oral prednisone and NSAIDS. Flexeril also prescribed

## 2015-12-03 NOTE — Progress Notes (Signed)
   Denise TRZCINSKI     MRN: [id number]      DOB: August 12, 1955   HPI Ms. Radebaugh is here with a 5 day h/o mid back pain, started after she moved a certain way trying to get out of a recliner chair. Since that time she is tender in mid back, has pain when she breathes , and upper body movement is limited. Does not radiaite down legs   ROS Denies recent fever or chills. Denies sinus pressure, nasal congestion, ear pain or sore throat. Denies chest congestion, productive cough or wheezing.  Denies , numbness, or tingling. Denies depression, anxiety or insomnia. Denies skin break down or rash.   PE  BP 118/80   Pulse 79   Wt 157 lb (71.2 kg)   SpO2 97%   BMI 25.34 kg/m   Patient alert and oriented and in no cardiopulmonary distress.  HEENT: No facial asymmetry, EOMI,   oropharynx pink and moist.  Neck supple no JVD, no mass.  Chest: Clear to auscultation bilaterally.  CVS: S1, S2 no murmurs, no S3.Regular rate.  ABD: Soft non tender.   Ext: No edema  MS: Decreased  ROM thoracic  spine, with palpable shoulders, hips and knees.  Skin: Intact, no ulcerations or rash noted.  Psych: Good eye contact, normal affect. Memory intact not anxious or depressed appearing.  CNS: CN 2-12 intact, power,  normal throughout.no focal deficits noted.   Assessment & Plan  Thoracic spine pain Uncontrolled.Toradol and depo medrol administered IM in the office , to be followed by a short course of oral prednisone and NSAIDS. Flexeril also prescribed    HTN (hypertension) Controlled, no change in medication DASH diet and commitment to daily physical activity for a minimum of 30 minutes discussed and encouraged, as a part of hypertension management. The importance of attaining a healthy weight is also discussed.  BP/Weight 12/03/2015 08/05/2015 05/19/2015 01/27/2015 01/06/2015 11/25/2014 123456  Systolic BP 123456 123XX123 A999333 123456 123XX123 A999333 [id number]  Diastolic BP 80 72 80 72 70 80 82  Wt. (Lbs) 157  151 147.8 147 - 148 148.04  BMI 25.34 24.38 23.87 23.74 - 23.9 23.91

## 2015-12-03 NOTE — Telephone Encounter (Signed)
[name] agreed to be seen today at 1:00 by Dr. Moshe Cipro

## 2015-12-03 NOTE — Telephone Encounter (Signed)
Patient called  And left message on the machine that she thinks she pulled a muscle in her back. Has been having a lot of pain and either wants to be seen or come in for pain injections.

## 2015-12-03 NOTE — Telephone Encounter (Signed)
Dr. Moshe Cipro, [name] has just left a message on the machine that she is thinking that she has pulled a muscle in her back and is in pain, please advise?

## 2015-12-03 NOTE — Telephone Encounter (Signed)
Ov this pm as discussed, pls

## 2015-12-09 ENCOUNTER — Encounter: Payer: Self-pay | Admitting: Family Medicine

## 2015-12-09 ENCOUNTER — Ambulatory Visit (INDEPENDENT_AMBULATORY_CARE_PROVIDER_SITE_OTHER): Payer: BC Managed Care – PPO | Admitting: Family Medicine

## 2015-12-09 VITALS — BP 138/84 | HR 88 | Resp 16 | Ht 66.0 in | Wt 158.0 lb

## 2015-12-09 DIAGNOSIS — Z1211 Encounter for screening for malignant neoplasm of colon: Secondary | ICD-10-CM | POA: Diagnosis not present

## 2015-12-09 DIAGNOSIS — Z0000 Encounter for general adult medical examination without abnormal findings: Secondary | ICD-10-CM | POA: Diagnosis not present

## 2015-12-09 DIAGNOSIS — Z23 Encounter for immunization: Secondary | ICD-10-CM | POA: Diagnosis not present

## 2015-12-09 LAB — POC HEMOCCULT BLD/STL (OFFICE/1-CARD/DIAGNOSTIC): Fecal Occult Blood, POC: NEGATIVE

## 2015-12-09 MED ORDER — AMLODIPINE BESYLATE 5 MG PO TABS: 5.0000 mg | ORAL_TABLET | Freq: Every day | ORAL | 6 refills | Status: DC

## 2015-12-09 NOTE — Patient Instructions (Signed)
F/u in 5.5 month, call if you need me sooner  Flu vaccine today  Fasting labs mid December  Please work on good  health habits so that your health will improve. 1. Commitment to daily physical activity for 30 to 60  minutes, if you are able to do this.  2. Commitment to wise food choices. Aim for half of your  food intake to be vegetable and fruit, one quarter starchy foods, and one quarter protein. Try to eat on a regular schedule  3 meals per day, snacking between meals should be limited to vegetables or fruits or small portions of nuts. 64 ounces of water per day is generally recommended, unless you have specific health conditions, like heart failure or kidney failure where you will need to limit fluid intake.  3. Commitment to sufficient and a  good quality of physical and mental rest daily, generally between 6 to 8 hours per day.  WITH PERSISTANCE AND PERSEVERANCE, THE IMPOSSIBLE , BECOMES THE NORM!

## 2015-12-09 NOTE — Assessment & Plan Note (Signed)
After obtaining informed consent, the vaccine is  administered by LPN.  

## 2015-12-09 NOTE — Progress Notes (Signed)
    Denise Werner     MRN: [id number]      DOB: Mar 11, 1955  HPI: Patient is in for annual physical exam. No other health concerns are expressed or addressed at the visit. Recent labs, if available are reviewed. Immunization is reviewed , and  updated if needed.   PE: Pleasant  female, alert and oriented x 3, in no cardio-pulmonary distress. Afebrile. HEENT No facial trauma or asymetry. Sinuses non tender.  Extra occullar muscles intact, pupils equally reactive to light. External ears normal, tympanic membranes clear. Oropharynx moist, no exudate. Neck: supple, no adenopathy,JVD or thyromegaly.No bruits.  Chest: Clear to ascultation bilaterally.No crackles or wheezes. Non tender to palpation  Breast: No asymetry,no masses or lumps. No tenderness. No nipple discharge or inversion. No axillary or supraclavicular adenopathy  Cardiovascular system; Heart sounds normal,  S1 and  S2 ,no S3.  No murmur, or thrill. Apical beat not displaced Peripheral pulses normal.  Abdomen: Soft, non tender, no organomegaly or masses. No bruits. Bowel sounds normal. No guarding, tenderness or rebound.  Rectal:  Normal sphincter tone. No rectal mass. Guaiac negative stool.  GU: External genitalia normal female genitalia , normal female distribution of hair. No lesions. Urethral meatus normal in size, no  Prolapse, no lesions visibly  Present. Bladder non tender. Vagina pink and moist , with no visible lesions , discharge present . Adequate pelvic support no  cystocele or rectocele noted  Uterus absent, no adnexal masses, no  adnexal tenderness.   Musculoskeletal exam: Full ROM of spine, hips , shoulders and knees. No deformity ,swelling or crepitus noted. No muscle wasting or atrophy.   Neurologic: Cranial nerves 2 to 12 intact. Power, tone ,sensation and reflexes normal throughout. No disturbance in gait. No tremor.  Skin: Intact, no ulceration, erythema , scaling or rash  noted. Pigmentation normal throughout  Psych; Normal mood and affect. Judgement and concentration normal   Assessment & Plan:  Annual physical exam Annual exam as documented. Counseling done  re healthy lifestyle involving commitment to 150 minutes exercise per week, heart healthy diet, and attaining healthy weight.The importance of adequate sleep also discussed. Regular seat belt use and home safety, is also discussed. Changes in health habits are decided on by the patient with goals and time frames  set for achieving them. Immunization and cancer screening needs are specifically addressed at this visit.   Need for prophylactic vaccination and inoculation against influenza After obtaining informed consent, the vaccine is  administered by LPN.

## 2015-12-09 NOTE — Assessment & Plan Note (Signed)

## 2016-01-05 ENCOUNTER — Encounter: Payer: Self-pay | Admitting: Family Medicine

## 2016-01-05 LAB — COMPLETE METABOLIC PANEL WITH GFR
ALT: 16 U/L (ref 6–29)
AST: 17 U/L (ref 10–35)
Albumin: 3.8 g/dL (ref 3.6–5.1)
Alkaline Phosphatase: 102 U/L (ref 33–130)
BILIRUBIN TOTAL: 0.3 mg/dL (ref 0.2–1.2)
BUN: 14 mg/dL (ref 7–25)
CHLORIDE: 105 mmol/L (ref 98–110)
CO2: 30 mmol/L (ref 20–31)
CREATININE: 0.89 mg/dL (ref 0.50–1.05)
Calcium: 9.1 mg/dL (ref 8.6–10.4)
GFR, Est African American: 82 mL/min (ref >=60)
GFR, Est Non African American: 71 mL/min (ref >=60)
Glucose, Bld: 91 mg/dL (ref 65–99)
Potassium: 4.3 mmol/L (ref 3.5–5.3)
Sodium: 139 mmol/L (ref 135–146)
TOTAL PROTEIN: 6.9 g/dL (ref 6.1–8.1)

## 2016-01-05 LAB — HEMOGLOBIN A1C
Hgb A1c MFr Bld: 5.8 % — ABNORMAL HIGH (ref <5.7)
Mean Plasma Glucose: 120 mg/dL

## 2016-01-05 LAB — LIPID PANEL
CHOLESTEROL: 192 mg/dL (ref <200)
HDL: 56 mg/dL (ref >50)
LDL CALC: 127 mg/dL — AB (ref <100)
TRIGLYCERIDES: 45 mg/dL (ref <150)
Total CHOL/HDL Ratio: 3.4 Ratio (ref <5.0)
VLDL: 9 mg/dL (ref <30)

## 2016-01-06 ENCOUNTER — Telehealth: Payer: Self-pay

## 2016-01-06 DIAGNOSIS — R7303 Prediabetes: Secondary | ICD-10-CM

## 2016-01-06 DIAGNOSIS — E785 Hyperlipidemia, unspecified: Secondary | ICD-10-CM

## 2016-01-06 DIAGNOSIS — E559 Vitamin D deficiency, unspecified: Secondary | ICD-10-CM

## 2016-01-06 DIAGNOSIS — I10 Essential (primary) hypertension: Secondary | ICD-10-CM

## 2016-01-06 NOTE — Telephone Encounter (Signed)
Lab orders entered and mailed to patient to have drawn before appointment

## 2016-01-12 ENCOUNTER — Other Ambulatory Visit (HOSPITAL_COMMUNITY): Payer: BC Managed Care – PPO

## 2016-01-12 ENCOUNTER — Ambulatory Visit (HOSPITAL_COMMUNITY): Payer: BC Managed Care – PPO

## 2016-01-15 ENCOUNTER — Encounter (HOSPITAL_COMMUNITY)
Admission: RE | Admit: 2016-01-15 | Discharge: 2016-01-15 | Disposition: A | Discharge status: Home / Self Care | Payer: BC Managed Care – PPO | Source: Ambulatory Visit | Attending: Family Medicine | Admitting: Family Medicine

## 2016-01-15 DIAGNOSIS — M8580 Other specified disorders of bone density and structure, unspecified site: Secondary | ICD-10-CM | POA: Insufficient documentation

## 2016-01-15 MED ORDER — ZOLEDRONIC ACID 5 MG/100ML IV SOLN
INTRAVENOUS | Status: AC
  Filled 2016-01-15: qty 100

## 2016-01-15 MED ORDER — SODIUM CHLORIDE 0.9 % IV SOLN
Freq: Once | INTRAVENOUS | Status: AC
  Administered 2016-01-15: 250 mL via INTRAVENOUS

## 2016-01-15 MED ORDER — ZOLEDRONIC ACID 5 MG/100ML IV SOLN
5.0000 mg | Freq: Once | INTRAVENOUS | Status: AC
  Administered 2016-01-15: 5 mg via INTRAVENOUS

## 2016-03-08 NOTE — Addendum Note (Signed)
Addended by: Felipa Emory on: 03/08/2016 11:51 AM   Modules accepted: Orders

## 2016-04-13 ENCOUNTER — Encounter (INDEPENDENT_AMBULATORY_CARE_PROVIDER_SITE_OTHER): Payer: Self-pay | Admitting: Internal Medicine

## 2016-05-18 ENCOUNTER — Ambulatory Visit (INDEPENDENT_AMBULATORY_CARE_PROVIDER_SITE_OTHER): Payer: BC Managed Care – PPO | Admitting: Internal Medicine

## 2016-05-29 ENCOUNTER — Other Ambulatory Visit (INDEPENDENT_AMBULATORY_CARE_PROVIDER_SITE_OTHER): Payer: Self-pay | Admitting: Internal Medicine

## 2016-05-29 DIAGNOSIS — K2100 Gastro-esophageal reflux disease with esophagitis, without bleeding: Secondary | ICD-10-CM

## 2016-06-01 NOTE — Addendum Note (Signed)
Addended by: Katherina Right D on: 06/01/2016 12:10 PM   Modules accepted: Orders

## 2016-06-09 ENCOUNTER — Ambulatory Visit: Payer: BC Managed Care – PPO | Admitting: Family Medicine

## 2016-06-14 ENCOUNTER — Ambulatory Visit (INDEPENDENT_AMBULATORY_CARE_PROVIDER_SITE_OTHER): Payer: BC Managed Care – PPO | Admitting: Family Medicine

## 2016-06-14 ENCOUNTER — Encounter: Payer: Self-pay | Admitting: Family Medicine

## 2016-06-14 VITALS — BP 120/82 | HR 93 | Resp 16 | Ht 66.0 in | Wt 159.0 lb

## 2016-06-14 DIAGNOSIS — R7303 Prediabetes: Secondary | ICD-10-CM | POA: Diagnosis not present

## 2016-06-14 DIAGNOSIS — I10 Essential (primary) hypertension: Secondary | ICD-10-CM | POA: Diagnosis not present

## 2016-06-14 DIAGNOSIS — E785 Hyperlipidemia, unspecified: Secondary | ICD-10-CM

## 2016-06-14 DIAGNOSIS — K219 Gastro-esophageal reflux disease without esophagitis: Secondary | ICD-10-CM | POA: Diagnosis not present

## 2016-06-14 DIAGNOSIS — K645 Perianal venous thrombosis: Secondary | ICD-10-CM | POA: Diagnosis not present

## 2016-06-14 DIAGNOSIS — Z9109 Other allergy status, other than to drugs and biological substances: Secondary | ICD-10-CM

## 2016-06-14 MED ORDER — HYDROCORTISONE ACE-PRAMOXINE 1-1 % RE FOAM: 1.0000 | Freq: Two times a day (BID) | RECTAL | 0 refills | Status: DC

## 2016-06-14 MED ORDER — GABAPENTIN 100 MG PO CAPS: ORAL_CAPSULE | ORAL | 3 refills | Status: DC

## 2016-06-14 MED ORDER — IBUPROFEN 800 MG PO TABS: 800.0000 mg | ORAL_TABLET | Freq: Three times a day (TID) | ORAL | 1 refills | Status: DC | PRN

## 2016-06-14 MED ORDER — AMLODIPINE BESYLATE 5 MG PO TABS: 5.0000 mg | ORAL_TABLET | Freq: Every day | ORAL | 3 refills | Status: DC

## 2016-06-14 NOTE — Patient Instructions (Addendum)
Physical exam Nov 10 or after , call if you need me before  Please get fasting labs within the next week  Medications are sent in  You are treated for hemorrhoid  Thank you  for choosing [facility]. We consider it a privelige to serve you.  Delivering excellent health care in a caring and  compassionate way is our goal.  Partnering with you,  so that together we can achieve this goal is our strategy.

## 2016-06-14 NOTE — Progress Notes (Signed)
Denise Werner     MRN: [id number]      DOB: [birth year]   HPI Denise Werner is here for follow up and re-evaluation of chronic medical conditions, medication management and review of any available recent lab and radiology data.  Preventive health is updated, specifically  Cancer screening and Immunization.   Questions or concerns regarding consultations or procedures which the PT has had in the interim are  Addressed.Has been to therapy recently for neck pain, states improvedThe PT denies any adverse reactions to current medications since the last visit.  Early this morning when urinating experienced rectal discomfort like torn tissue, then later in the morning she wiped the area and felt a tender nodule, called in for eval  ROS Denies recent fever or chills. Denies sinus pressure, nasal congestion, ear pain or sore throat. Denies chest congestion, productive cough or wheezing. Denies chest pains, palpitations and leg swelling Denies abdominal pain, nausea, vomiting,diarrhea or constipation.   Denies dysuria, frequency, hesitancy or incontinence. Denies headaches, seizures, numbness, or tingling. Denies depression, anxiety or insomnia. Denies skin break down or rash.   PE  BP 120/82   Pulse 93   Resp 16   Ht 5\' 6"  (1.676 m)   Wt 159 lb (72.1 kg)   SpO2 100%   BMI 25.66 kg/m   Patient alert and oriented and in no cardiopulmonary distress.  HEENT: No facial asymmetry, EOMI,   oropharynx pink and moist.  Neck supple no JVD, no mass.  Chest: Clear to auscultation bilaterally.  CVS: S1, S2 no murmurs, no S3.Regular rate.  Rectal: Nodule mildly tender at 7 o'clock  Position no rectal blood on glove Ext: No edema  MS: Adequate ROM spine, shoulders, hips and knees.  Skin: Intact, no ulcerations or rash noted.  Psych: Good eye contact, normal affect. Memory intact not anxious or depressed appearing.  CNS: CN 2-12 intact, power,  normal throughout.no focal deficits  noted.   Assessment & Plan  HTN (hypertension) Controlled, no change in medication DASH diet and commitment to daily physical activity for a minimum of 30 minutes discussed and encouraged, as a part of hypertension management. The importance of attaining a healthy weight is also discussed.  BP/Weight 06/14/2016 01/15/2016 12/09/2015 12/03/2015 08/05/2015 05/19/2015 40/98/1191  Systolic BP 478 295 621 308 657 846 962  Diastolic BP 82 79 84 80 72 80 72  Wt. (Lbs) 159 - 158 157 151 147.8 147  BMI 25.66 - 25.5 25.34 24.38 23.87 23.74       Hemorrhoid Symptomatic with palpable  Mildly tender nodule, topical prep x 5 to 7 days , pt to call back if remains symptomatic, will refer to GI for further eval  Environmental allergies Controlled, no change in medication   GERD (gastroesophageal reflux disease) Improved, uses PPI as needed  Hyperlipidemia LDL goal <100 Updated lab needed at/ before next visit. Not currently  on a statin  Prediabetes Patient educated about the importance of limiting  Carbohydrate intake , the need to commit to daily physical activity for a minimum of 30 minutes , and to commit weight loss. The fact that changes in all these areas will reduce or eliminate all together the development of diabetes is stressed.   Diabetic Labs Latest Ref Rng & Units 01/04/2016 08/03/2015 11/25/2014 11/03/2014 06/16/2014  HbA1c <5.7 % 5.8(H) 6.0(H) - 6.2(H) 6.0(H)  Chol <200 mg/dL 192 154 - 126 180  HDL >50 mg/dL 56 54 - 59 54  Calc LDL <100 mg/dL  127(H) 89 - 60 117(H)  Triglycerides <150 mg/dL 45 53 - 33 46  Creatinine 0.50 - 1.05 mg/dL 0.89 0.73 0.78 0.77 0.78   BP/Weight 06/14/2016 01/15/2016 12/09/2015 12/03/2015 08/05/2015 05/19/2015 63/81/7711  Systolic BP 657 903 833 383 291 916 606  Diastolic BP 82 79 84 80 72 80 72  Wt. (Lbs) 159 - 158 157 151 147.8 147  BMI 25.66 - 25.5 25.34 24.38 23.87 23.74   No flowsheet data found.   Updated lab needed

## 2016-06-19 ENCOUNTER — Encounter: Payer: Self-pay | Admitting: Family Medicine

## 2016-06-19 NOTE — Assessment & Plan Note (Signed)
Updated lab needed at/ before next visit. Not currently  on a statin

## 2016-06-19 NOTE — Assessment & Plan Note (Signed)
Patient educated about the importance of limiting  Carbohydrate intake , the need to commit to daily physical activity for a minimum of 30 minutes , and to commit weight loss. The fact that changes in all these areas will reduce or eliminate all together the development of diabetes is stressed.   Diabetic Labs Latest Ref Rng & Units 01/04/2016 08/03/2015 11/25/2014 11/03/2014 06/16/2014  HbA1c <5.7 % 5.8(H) 6.0(H) - 6.2(H) 6.0(H)  Chol <200 mg/dL 192 154 - 126 180  HDL >50 mg/dL 56 54 - 59 54  Calc LDL <100 mg/dL 127(H) 89 - 60 117(H)  Triglycerides <150 mg/dL 45 53 - 33 46  Creatinine 0.50 - 1.05 mg/dL 0.89 0.73 0.78 0.77 0.78   BP/Weight 06/14/2016 01/15/2016 12/09/2015 12/03/2015 08/05/2015 05/19/2015 23/36/1224  Systolic BP 497 530 051 102 111 735 670  Diastolic BP 82 79 84 80 72 80 72  Wt. (Lbs) 159 - 158 157 151 147.8 147  BMI 25.66 - 25.5 25.34 24.38 23.87 23.74   No flowsheet data found.   Updated lab needed

## 2016-06-19 NOTE — Assessment & Plan Note (Signed)
Controlled, no change in medication DASH diet and commitment to daily physical activity for a minimum of 30 minutes discussed and encouraged, as a part of hypertension management. The importance of attaining a healthy weight is also discussed.  BP/Weight 06/14/2016 01/15/2016 12/09/2015 12/03/2015 08/05/2015 05/19/2015 63/78/5885  Systolic BP 027 741 287 867 672 094 709  Diastolic BP 82 79 84 80 72 80 72  Wt. (Lbs) 159 - 158 157 151 147.8 147  BMI 25.66 - 25.5 25.34 24.38 23.87 23.74

## 2016-06-19 NOTE — Assessment & Plan Note (Signed)
Symptomatic with palpable  Mildly tender nodule, topical prep x 5 to 7 days , pt to call back if remains symptomatic, will refer to GI for further eval

## 2016-06-19 NOTE — Assessment & Plan Note (Signed)
Controlled, no change in medication  

## 2016-06-19 NOTE — Assessment & Plan Note (Signed)
Improved, uses PPI as needed

## 2016-06-20 ENCOUNTER — Other Ambulatory Visit: Payer: Self-pay | Admitting: Family Medicine

## 2016-06-20 DIAGNOSIS — K6289 Other specified diseases of anus and rectum: Secondary | ICD-10-CM

## 2016-06-21 ENCOUNTER — Ambulatory Visit: Payer: BC Managed Care – PPO | Admitting: Family Medicine

## 2016-06-22 LAB — CBC
HEMATOCRIT: 38.9 % (ref 35.0–45.0)
HEMOGLOBIN: 12.1 g/dL (ref 11.7–15.5)
MCH: 26.3 pg — AB (ref 27.0–33.0)
MCHC: 31.1 g/dL — ABNORMAL LOW (ref 32.0–36.0)
MCV: 84.6 fL (ref 80.0–100.0)
MPV: 11 fL (ref 7.5–12.5)
Platelets: 313 10*3/uL (ref 140–400)
RBC: 4.6 MIL/uL (ref 3.80–5.10)
RDW: 14.2 % (ref 11.0–15.0)
WBC: 5.1 10*3/uL (ref 3.8–10.8)

## 2016-06-23 ENCOUNTER — Encounter: Payer: Self-pay | Admitting: Family Medicine

## 2016-06-23 LAB — BASIC METABOLIC PANEL WITH GFR
BUN: 12 mg/dL (ref 7–25)
CHLORIDE: 105 mmol/L (ref 98–110)
CO2: 25 mmol/L (ref 20–31)
CREATININE: 0.85 mg/dL (ref 0.50–1.05)
Calcium: 9.6 mg/dL (ref 8.6–10.4)
GFR, Est African American: 87 mL/min (ref >=60)
GFR, Est Non African American: 75 mL/min (ref >=60)
Glucose, Bld: 95 mg/dL (ref 65–99)
Potassium: 4.6 mmol/L (ref 3.5–5.3)
Sodium: 141 mmol/L (ref 135–146)

## 2016-06-23 LAB — HEMOGLOBIN A1C
Hgb A1c MFr Bld: 5.9 % — ABNORMAL HIGH (ref <5.7)
Mean Plasma Glucose: 123 mg/dL

## 2016-06-23 LAB — TSH: TSH: 2.18 mIU/L

## 2016-06-23 LAB — VITAMIN D 25 HYDROXY (VIT D DEFICIENCY, FRACTURES): Vit D, 25-Hydroxy: 33 ng/mL (ref 30–100)

## 2016-06-23 LAB — LIPID PANEL
Cholesterol: 192 mg/dL (ref <200)
HDL: 52 mg/dL (ref >50)
LDL Cholesterol: 130 mg/dL — ABNORMAL HIGH (ref <100)
TRIGLYCERIDES: 50 mg/dL (ref <150)
Total CHOL/HDL Ratio: 3.7 Ratio (ref <5.0)
VLDL: 10 mg/dL (ref <30)

## 2016-06-28 ENCOUNTER — Encounter (INDEPENDENT_AMBULATORY_CARE_PROVIDER_SITE_OTHER): Payer: Self-pay | Admitting: Internal Medicine

## 2016-06-28 ENCOUNTER — Ambulatory Visit (INDEPENDENT_AMBULATORY_CARE_PROVIDER_SITE_OTHER): Payer: BC Managed Care – PPO | Admitting: Internal Medicine

## 2016-06-28 VITALS — BP 132/84 | HR 62 | Temp 97.7°F | Ht 66.0 in | Wt 156.0 lb

## 2016-06-28 DIAGNOSIS — R58 Hemorrhage, not elsewhere classified: Secondary | ICD-10-CM

## 2016-06-28 MED ORDER — HYDROCORTISONE ACE-PRAMOXINE 1-1 % RE FOAM: 1.0000 | Freq: Two times a day (BID) | RECTAL | 0 refills | Status: DC

## 2016-06-28 NOTE — Progress Notes (Signed)
Subjective:    Patient ID: Denise Werner, female    DOB: 03-Apr-1955, 61 y.o.   MRN: [id number]  HPI   Presents today with c/o painful hemorrhoids.  Last colonoscopy was in February 2010 revealing mild sigmoid diverticulosis. She was last seen in April of 2017. Given an Rx for Proctofoam BID by Dr. Moshe Cipro last week.  She says the pain is a litle better.  Stools are soft.    Review of Systems     Past Medical History:  Diagnosis Date  . Asthma   . Bladder pain   . Chronic neck pain   . Dyslipidemia   . Frequency of urination   . GERD (gastroesophageal reflux disease) OCCASIONAL  . H/O hiatal hernia   . Interstitial cystitis   . Nocturia   . Short of breath on exertion   . Urgency of urination     Past Surgical History:  Procedure Laterality Date  . ABDOMINAL HYSTERECTOMY     W/ BILATERAL SALPINGOOPHECTOMY  . APPENDECTOMY    . BUNIONECTOMY  04-14-2003   LEFT FOOT  W/ POST REMOVAL INTERNAL FIXATION DEVICE  VIA SILVER BUNIONECTOMY  . CHOLECYSTECTOMY  1998  . CYSTO WITH HYDRODISTENSION  09/12/2011   Procedure: CYSTOSCOPY/HYDRODISTENSION;  Surgeon: Ailene Rud, MD;  Location: [facility];  Service: Urology;  Laterality: N/A;  INSTILL M & P AND M & P  . CYSTO/ HOD/ INSTILLATION THERAPY  11-17-2008;   12-02-1999   INTERSTITIAL CYSTITIS  . ESOPHAGOGASTRODUODENOSCOPY N/A 05/01/2013   Procedure: ESOPHAGOGASTRODUODENOSCOPY (EGD);  Surgeon: Rogene Houston, MD;  [facility];  Service: Endoscopy;  Laterality: N/A;  100  . FINGER SURGERY  10-13-2009   left 5th finger ARTHRODESIS  . FOOT ARTHROPLASTY  05-03-2004   FIFTHE TOE RIGHT FOOT  . KNEE ARTHROSCOPY  JAN 2013   LEFT KNEE    Allergies  Allergen Reactions  . Latex Rash  . Hydrocodone Nausea And Vomiting    Also causes dizziness and states feels like she has an outer body experience.  . Sulfonamide Derivatives     Current Outpatient Prescriptions on File Prior to Visit  Medication  Sig Dispense Refill  . amLODipine (NORVASC) 5 MG tablet Take 1 tablet (5 mg total) by mouth daily. 90 tablet 3  . Calcium Carbonate-Vit D-Min (CALCIUM 1200) 1200-1000 MG-UNIT CHEW Chew 1 tablet by mouth daily.     Marland Kitchen gabapentin (NEURONTIN) 100 MG capsule One capsule at bedtime as needed 90 capsule 3  . hydrocortisone-pramoxine (PROCTOFOAM-HC) rectal foam Place 1 applicator rectally 2 (two) times daily. 10 g 0  . ibuprofen (ADVIL,MOTRIN) 800 MG tablet Take 1 tablet (800 mg total) by mouth every 8 (eight) hours as needed. 30 tablet 1  . pantoprazole (PROTONIX) 40 MG tablet TAKE 1 TABLET (40 MG TOTAL) BY MOUTH DAILY AS NEEDED. 30 tablet 7  . azelastine (ASTELIN) 0.1 % nasal spray Place 2 sprays into both nostrils 2 (two) times daily. Use in each nostril as directed 30 mL 12  . ciclesonide (ALVESCO) 160 MCG/ACT inhaler Inhale 1 puff into the lungs 2 (two) times daily. 1 Inhaler 3  . EPINEPHrine (EPIPEN 2-PAK) 0.3 mg/0.3 mL IJ SOAJ injection Inject 0.3 mLs (0.3 mg total) into the muscle once. (Patient not taking: Reported on 06/28/2016) 2 Device 2  . fluticasone (FLONASE) 50 MCG/ACT nasal spray Place 2 sprays into both nostrils daily. 16 g 6  . levalbuterol (XOPENEX HFA) 45 MCG/ACT inhaler Inhale 2 puffs into the lungs  every 4 (four) hours as needed for wheezing. Two puffs every 4 hours as needed , for wheezing 1 Inhaler 3   No current facility-administered medications on file prior to visit.    ' Objective:   Physical Exam  Blood pressure 132/84, pulse 62, temperature 97.7 F (36.5 C), height 5\' 6"  (1.676 m), weight 156 lb (70.8 kg).   Alert and oriented. Skin warm and dry. Sclera anicteric. Lungs clear. HR regular. No murmur. No hepatomegaly, No splenomegaly. Abdomen soft, BS+. No tenderness. No edema to lower extremities. Tenderness to left rectal.      Assessment & Plan:  Hemorrhoid. Continue the Proctofoam. Refill sent in.  Will refer to Dr. Tresa Garter if not symptoms do not improve. She  will call with PR in 2 weeks.  May try stool softener.

## 2016-06-28 NOTE — Patient Instructions (Signed)
Rx for Proctofoam

## 2016-06-29 ENCOUNTER — Other Ambulatory Visit: Payer: Self-pay | Admitting: Family Medicine

## 2016-06-29 MED ORDER — ROSUVASTATIN CALCIUM 5 MG PO TABS: 5.0000 mg | ORAL_TABLET | Freq: Every day | ORAL | 5 refills | Status: DC

## 2016-06-29 NOTE — Progress Notes (Signed)
[facility]

## 2016-09-08 ENCOUNTER — Other Ambulatory Visit: Payer: Self-pay | Admitting: Family Medicine

## 2016-09-08 DIAGNOSIS — J45991 Cough variant asthma: Secondary | ICD-10-CM

## 2016-09-28 ENCOUNTER — Encounter: Payer: Self-pay | Admitting: Family Medicine

## 2016-09-29 ENCOUNTER — Ambulatory Visit: Payer: BC Managed Care – PPO | Admitting: Family Medicine

## 2016-10-11 ENCOUNTER — Encounter (INDEPENDENT_AMBULATORY_CARE_PROVIDER_SITE_OTHER): Payer: Self-pay | Admitting: Internal Medicine

## 2016-10-11 ENCOUNTER — Ambulatory Visit (INDEPENDENT_AMBULATORY_CARE_PROVIDER_SITE_OTHER): Payer: BC Managed Care – PPO | Admitting: Internal Medicine

## 2016-10-11 VITALS — BP 124/82 | HR 64 | Temp 97.3°F | Resp 18 | Ht 66.0 in | Wt 155.7 lb

## 2016-10-11 DIAGNOSIS — K2100 Gastro-esophageal reflux disease with esophagitis, without bleeding: Secondary | ICD-10-CM

## 2016-10-11 MED ORDER — FAMOTIDINE 20 MG PO TABS: 20.0000 mg | ORAL_TABLET | Freq: Every day | ORAL | Status: DC

## 2016-10-11 NOTE — Progress Notes (Signed)
Presenting complaint;  Breathing difficulty at night. History of GERD.  Subjective:  Patient is 61 year old African-American female who was last seen in May 2018 for hemorrhoidal bleeding and now returns with complaints of difficulty breathing when she lies supine at night. She has history of asthma and is using therapy on as-needed basis. She does not believe that her symptoms are due to asthma. She is convinced that her symptoms are due to GERD. However she has occasional heartburn. She has some burping but she eats fast. She does not have regurgitation often. She eats supper usually between 7 and 8 PM. She denies abdominal pain melena or rectal bleeding. She stays active. She walks at least 3 times a week for 30 minutes each time. She denies exertional dyspnea. She states she is taking pantoprazole twice daily but she take second dose at bedtime. Her weight has been stable.  She had EGD in April 2015 revealing mild changes of reflux esophagitis limited to GE junction small sliding hiatal hernia and few hyperplastic polyps. 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  Her last screening colonoscopy was in February 2010 revealing mild sigmoid colon diverticula and no other abnormalities.    Current Medications: Outpatient Encounter Prescriptions as of 10/11/2016  Medication Sig  . amLODipine (NORVASC) 5 MG tablet Take 1 tablet (5 mg total) by mouth daily.  Marland Kitchen azelastine (ASTELIN) 0.1 % nasal spray Place into both nostrils 2 (two) times daily. Use in each nostril as directed  . Calcium Carbonate-Vit D-Min (CALCIUM 1200) 1200-1000 MG-UNIT CHEW Chew 1 tablet by mouth daily.   . ciclesonide (ALVESCO) 160 MCG/ACT inhaler Inhale 1 puff into the lungs 2 (two) times daily.  Marland Kitchen gabapentin (NEURONTIN) 100 MG capsule One capsule at bedtime as needed  . ibuprofen (ADVIL,MOTRIN) 800 MG tablet Take 1 tablet (800 mg total) by mouth every 8 (eight) hours as needed.  . levalbuterol  (XOPENEX HFA) 45 MCG/ACT inhaler INHALE 2 PUFFS INTO THE LUNGS EVERY 4 HOURS AS NEEDED FOR WHEEZING.  . pantoprazole (PROTONIX) 40 MG tablet TAKE 1 TABLET (40 MG TOTAL) BY MOUTH DAILY AS NEEDED.  Marland Kitchen rosuvastatin (CRESTOR) 5 MG tablet Take 1 tablet (5 mg total) by mouth daily.  Marland Kitchen azelastine (ASTELIN) 0.1 % nasal spray Place 2 sprays into both nostrils 2 (two) times daily. Use in each nostril as directed  . ciclesonide (ALVESCO) 160 MCG/ACT inhaler Inhale 1 puff into the lungs 2 (two) times daily.  Marland Kitchen EPINEPHrine (EPIPEN 2-PAK) 0.3 mg/0.3 mL IJ SOAJ injection Inject 0.3 mLs (0.3 mg total) into the muscle once. (Patient not taking: Reported on 06/28/2016)  . fluticasone (FLONASE) 50 MCG/ACT nasal spray Place 2 sprays into both nostrils daily.  . [DISCONTINUED] hydrocortisone-pramoxine (PROCTOFOAM-HC) rectal foam Place 1 applicator rectally 2 (two) times daily. (Patient not taking: Reported on 10/11/2016)   No facility-administered encounter medications on file as of 10/11/2016.      Objective: Blood pressure 124/82, pulse 64, temperature (!) 97.3 F (36.3 C), temperature source Oral, resp. rate 18, height 5\' 6"  (1.676 m), weight 155 lb 11.2 oz (70.6 kg). Patient is alert and in no acute distress. Conjunctiva is pink. Sclera is nonicteric Oropharyngeal mucosa is normal. No neck masses or thyromegaly noted. Cardiac exam with regular rhythm normal S1 and S2. No murmur or gallop noted. Lungs are clear to auscultation. Abdomen is symmetrical soft and nontender without organomegaly or masses. No LE edema or clubbing noted.   Assessment:  #1. GERD. Typical symptoms are  well controlled with therapy but she is having difficulty breathing when she is supine at night. It remains to be seen whether respiratory symptoms are due to nocturnal regurgitation. If she does not respond to aggressive therapy will consider EGD with pH study.  #2. She is average risk for CRC. Last colonoscopy was in 2010 revealing  hyperplastic polyps. Next screening exam in February 2020   Plan:  Anti-reflux measures reinforced. She should try to eat supper at least 3-4 hours before she goes to bed. Take pantoprazole 40 mg before breakfast and evening meal. Famotidine 20 mg by mouth daily at bedtime. Patient will keep symptom diary until office visit in 2 months.

## 2016-10-11 NOTE — Patient Instructions (Addendum)
Keep symptom diary until office visit in 2 months. Eat supper at least 4 hours before going to bed.

## 2016-10-19 ENCOUNTER — Encounter (INDEPENDENT_AMBULATORY_CARE_PROVIDER_SITE_OTHER): Payer: Self-pay | Admitting: Internal Medicine

## 2016-10-25 ENCOUNTER — Other Ambulatory Visit: Payer: Self-pay | Admitting: Family Medicine

## 2016-10-25 DIAGNOSIS — Z1231 Encounter for screening mammogram for malignant neoplasm of breast: Secondary | ICD-10-CM

## 2016-11-22 ENCOUNTER — Encounter: Payer: Self-pay | Admitting: Allergy & Immunology

## 2016-11-22 ENCOUNTER — Ambulatory Visit (INDEPENDENT_AMBULATORY_CARE_PROVIDER_SITE_OTHER): Payer: BC Managed Care – PPO | Admitting: Allergy & Immunology

## 2016-11-22 VITALS — BP 120/72 | HR 83 | Temp 97.9°F | Resp 17 | Ht 64.96 in | Wt 155.0 lb

## 2016-11-22 DIAGNOSIS — J302 Other seasonal allergic rhinitis: Secondary | ICD-10-CM | POA: Diagnosis not present

## 2016-11-22 DIAGNOSIS — J3089 Other allergic rhinitis: Secondary | ICD-10-CM | POA: Diagnosis not present

## 2016-11-22 DIAGNOSIS — J4540 Moderate persistent asthma, uncomplicated: Secondary | ICD-10-CM

## 2016-11-22 NOTE — Progress Notes (Signed)
We did receive her paper chart after she left. She last had testing performed in March 2011 that showed sensitizations to cockroach, dust mite, and mold mix # 4. She also had testing performed in November 2007 that was strongly reactive to grasses, weed mix, dog, cockroach, and dust mite. Since it has been longer than five years since her last testing and we are considering starting allergy shots, we will need to perform repeat skin testing at her next visit.   [name], MD [facility]

## 2016-11-22 NOTE — Progress Notes (Signed)
FOLLOW UP  Date of Service/Encounter:  11/22/16   Assessment:   Moderate persistent asthma versus reactive airway dysfunction syndrome  Seasonal and perennial allergic rhinitis    Asthma Reportables:  Severity: moderate persistent  Risk: high Control: not well controlled   Plan/Recommendations:   1. Moderate persistent asthma, uncomplicated - Lung testing looked normal today. - Symptoms are consistent with reactive airway dysfunction syndrome, which occurs due to sensitizations to scents and fragrances. - RADS is essentially treated the same as asthma, therefore we will try to treat with a combined ICS/LABA. - I chose Breo to make it easier for her from a compliance standpoint.  - We will start Breo to see if this can help your coughing.  - Daily controller medication(s): Breo 100/25 one puff once daily - Prior to physical activity: ProAir 2 puffs 10-15 minutes before physical activity. - Rescue medications: ProAir 4 puffs every 4-6 hours as needed - Asthma control goals:  * Full participation in all desired activities (may need albuterol before activity) * Albuterol use two time or less a week on average (not counting use with activity) * Cough interfering with sleep two time or less a month * Oral steroids no more than once a year * No hospitalizations  2. Seasonal and perennial allergic rhinitis - Stop the Flonase and start Dymista 2 sprays per nostril daily. - Continue with Mucinex daily as needed. - Start the prednisone pack provided. - We will hunt your chart down and see if we need to do skin testing again. - If you continue to have symptoms, we may consider starting allergy shots once again.  3. Return in about 3 weeks (around 12/13/2016) for possible skin testing.    Subjective:   Denise Werner is a 61 y.o. female presenting today for follow up of  Chief Complaint  Patient presents with  . Allergies  . Cough  . Nasal Congestion    Denise P  Werner has a history of the following: Patient Active Problem List   Diagnosis Date Noted  . GERD (gastroesophageal reflux disease) 12/18/2012  . HTN (hypertension) 09/13/2011  . Hemorrhoid 10/06/2010  . Osteopenia 09/20/2010  . GANGLION CYST 03/17/2010  . Prediabetes 10/08/2009  . SPRAIN AND STRAIN OF INTERPHALANGEAL OF HAND 08/17/2009  . Environmental allergies 01/11/2008  . Cough variant asthma 01/11/2008  . Hyperlipidemia LDL goal <100 08/16/2007    History obtained from: chart review and patient.  Denise Werner's Primary Care Provider is Denise Helper, MD.     Denise Werner is a 61 y.o. female presenting for a follow up visit. She was last seen in December 2016 by Dr. Ishmael Holter, who has since left the practice. At that time, she was doing well. She has a history of mild persistent asthma and allergic rhinoconjunctivitis and was stable on immunotherapy. Her last shot visit was in June 2017. At the time, she was receiving 0.11mL of the DM/Mold/CR and G/W every three weeks.    Since the last visit, she has not done well. She did not feel that the shots were helping at all so she stopped; however this appears to have been a mistake. She felt terrible since that time witht copious congestion and mucous production. Symptoms occur throughout the year. She is currently taking Mucinex and cough medicine without improvement. She is using the Flonase and OTC medications without improvement.    She has been having problems with breathing when she is laying down. She took some "bronchial aid  pills" which are available OTC. This is Mucinex combined with ephedrine per the container that she shows me today. She is using this three times per week. Albuterol is helping as well. She has used it six times in the last month. She denies prednisone use. She does cough every night without fail; typically is is clear or yellow. She has used Singulair in the past without improvement, but she has not taken a  daily controller medication of the inhaled variety including Symbicort, Advair, Flovent, or Qvar. She does not want to use medications every day. She does have problems with scents and strong vapors, including cigarette smoke. She is also active in the choir at her church, and she has not been able to sing as vigorously as she used to.   Otherwise, there have been no changes to her past medical history, surgical history, family history, or social history. She did work for five years at [employer]. She is now working as a Radiation protection practitioner for a school. She has no animals at home and denies mold exposure, at least known mold exposure.     Review of Systems: a 14-point review of systems is pertinent for what is mentioned in HPI.  Otherwise, all other systems were negative. Constitutional: negative other than that listed in the HPI Eyes: negative other than that listed in the HPI Ears, nose, mouth, throat, and face: negative other than that listed in the HPI Respiratory: negative other than that listed in the HPI Cardiovascular: negative other than that listed in the HPI Gastrointestinal: negative other than that listed in the HPI Genitourinary: negative other than that listed in the HPI Integument: negative other than that listed in the HPI Hematologic: negative other than that listed in the HPI Musculoskeletal: negative other than that listed in the HPI Neurological: negative other than that listed in the HPI Allergy/Immunologic: negative other than that listed in the HPI    Objective:   Blood pressure 120/72, pulse 83, temperature 97.9 F (36.6 C), resp. rate 17, height 5' 4.96" (1.65 m), weight 155 lb (70.3 kg), SpO2 94 %. Body mass index is 25.82 kg/m.   Physical Exam:  General: Alert, interactive, in no acute distress. Pleasant and smiling. Engaging.  Eyes: No conjunctival injection bilaterally, no discharge on the right, no discharge on the left and no Horner-Trantas dots present.  PERRL bilaterally. EOMI without pain. No photophobia.  Ears: Right TM pearly gray with normal light reflex, Left TM pearly gray with normal light reflex, Right TM intact without perforation and Left TM intact without perforation.  Nose/Throat: External nose within normal limits and septum midline. Turbinates markedly edematous without discharge. Posterior oropharynx erythematous with cobblestoning in the posterior oropharynx. Tonsils 2+ without exudates.  Tongue without thrush. Adenopathy: shoddy bilateral anterior cervical lymphadenopathy and no enlarged lymph nodes appreciated in the occipital, axillary, epitrochlear, inguinal, or popliteal regions. Lungs: Clear to auscultation without wheezing, rhonchi or rales. No increased work of breathing. CV: Normal S1/S2. No murmurs. Capillary refill <2 seconds.  Skin: Warm and dry, without lesions or rashes. Neuro:   Grossly intact. No focal deficits appreciated. Responsive to questions.  Diagnostic studies:   Spirometry: results normal (FEV1: 1.85/90%, FVC: 2.49/103%, FEV1/FVC: 74%).    Spirometry consistent with normal pattern.    Allergy Studies: deferred since I did not know when her last testing was performed.       [name], MD [facility]

## 2016-11-22 NOTE — Patient Instructions (Addendum)
1. Moderate persistent asthma, uncomplicated - Lung testing looked normal today. - We will start Breo to see if this can help your coughing.  - Daily controller medication(s): Breo 100/25 one puff once daily - Prior to physical activity: ProAir 2 puffs 10-15 minutes before physical activity. - Rescue medications: ProAir 4 puffs every 4-6 hours as needed - Asthma control goals:  * Full participation in all desired activities (may need albuterol before activity) * Albuterol use two time or less a week on average (not counting use with activity) * Cough interfering with sleep two time or less a month * Oral steroids no more than once a year * No hospitalizations  2. Seasonal and perennial allergic rhinitis - Stop the Flonase and start Dymista 2 sprays per nostril daily. - Continue with Mucinex daily as needed. - Start the prednisone pack provided. - We will hunt your chart down and see if we need to do skin testing again. - If you continue to have symptoms, we may consider starting allergy shots once again.  3. Return in about 3 weeks (around 12/13/2016).   Please inform us of any Emergency Department visits, hospitalizations, or changes in symptoms. Call us before going to the ED for breathing or allergy symptoms since we might be able to fit you in for a sick visit. Feel free to contact us anytime with any questions, problems, or concerns.  It was a pleasure to meet you today! Enjoy the fall season!  Websites that have reliable patient information: 1. American Academy of Asthma, Allergy, and Immunology: www.aaaai.org 2. Food Allergy Research and Education (FARE): foodallergy.org 3. Mothers of Asthmatics: http://www.asthmacommunitynetwork.org 4. American College of Allergy, Asthma, and Immunology: www.acaai.org   Election Day is coming up on Tuesday, November 6th! Although it is too late to register to vote by mail, you can still register up to November 5th at any of the early voting  locations. Try to early vote in case there are problems with your registration!   If you are turned away at the polls, you have the right to request a provisional ballot, which is required by law!      [place]- [place] (open 8am - 5pm) First Floor [place], [place] (open [place]) [place], [place] (open 7am - 7pm)  [place], [employer] (open 7am - 7pm) [address], [employer] (open 7am - 7pm) [address] [employer], [employer] (open 7am - 7pm) [place], [place] (open [place]) [place], [place] (open 7am - 7pm) [place], [employer] (open 7am - 7pm) [place], [place]

## 2016-11-24 NOTE — Addendum Note (Signed)
Addended by: Herbie Drape on: 11/24/2016 08:31 AM   Modules accepted: Orders

## 2016-12-05 ENCOUNTER — Ambulatory Visit
Admission: RE | Admit: 2016-12-05 | Discharge: 2016-12-05 | Disposition: A | Discharge status: Home / Self Care | Payer: BC Managed Care – PPO | Source: Ambulatory Visit | Attending: Family Medicine | Admitting: Family Medicine

## 2016-12-05 DIAGNOSIS — Z1231 Encounter for screening mammogram for malignant neoplasm of breast: Secondary | ICD-10-CM

## 2016-12-10 ENCOUNTER — Other Ambulatory Visit: Payer: Self-pay | Admitting: Family Medicine

## 2016-12-12 ENCOUNTER — Ambulatory Visit (INDEPENDENT_AMBULATORY_CARE_PROVIDER_SITE_OTHER): Payer: BC Managed Care – PPO | Admitting: Family Medicine

## 2016-12-12 ENCOUNTER — Other Ambulatory Visit (HOSPITAL_COMMUNITY)
Admission: RE | Admit: 2016-12-12 | Discharge: 2016-12-12 | Disposition: A | Discharge status: Home / Self Care | Payer: BC Managed Care – PPO | Source: Ambulatory Visit | Attending: Family Medicine | Admitting: Family Medicine

## 2016-12-12 ENCOUNTER — Encounter: Payer: Self-pay | Admitting: Family Medicine

## 2016-12-12 VITALS — BP 134/82 | HR 76 | Resp 16 | Ht 65.0 in | Wt 153.0 lb

## 2016-12-12 DIAGNOSIS — Z124 Encounter for screening for malignant neoplasm of cervix: Secondary | ICD-10-CM | POA: Diagnosis not present

## 2016-12-12 DIAGNOSIS — Z0000 Encounter for general adult medical examination without abnormal findings: Secondary | ICD-10-CM | POA: Diagnosis not present

## 2016-12-12 DIAGNOSIS — Z1211 Encounter for screening for malignant neoplasm of colon: Secondary | ICD-10-CM | POA: Diagnosis not present

## 2016-12-12 DIAGNOSIS — Z23 Encounter for immunization: Secondary | ICD-10-CM | POA: Diagnosis not present

## 2016-12-12 LAB — COMPLETE METABOLIC PANEL WITH GFR
AG RATIO: 1.4 (calc) (ref 1.0–2.5)
ALT: 22 U/L (ref 6–29)
AST: 21 U/L (ref 10–35)
Albumin: 4.3 g/dL (ref 3.6–5.1)
Alkaline phosphatase (APISO): 97 U/L (ref 33–130)
BUN: 12 mg/dL (ref 7–25)
CO2: 30 mmol/L (ref 20–32)
CREATININE: 0.7 mg/dL (ref 0.50–0.99)
Calcium: 9.5 mg/dL (ref 8.6–10.4)
Chloride: 105 mmol/L (ref 98–110)
GFR, Est African American: 109 mL/min/{1.73_m2} (ref >=60)
GFR, Est Non African American: 94 mL/min/{1.73_m2} (ref >=60)
GLOBULIN: 3.1 g/dL (ref 1.9–3.7)
Glucose, Bld: 96 mg/dL (ref 65–99)
Potassium: 4.3 mmol/L (ref 3.5–5.3)
SODIUM: 140 mmol/L (ref 135–146)
Total Bilirubin: 0.4 mg/dL (ref 0.2–1.2)
Total Protein: 7.4 g/dL (ref 6.1–8.1)

## 2016-12-12 LAB — LIPID PANEL
CHOL/HDL RATIO: 2.8 (calc) (ref <5.0)
CHOLESTEROL: 168 mg/dL (ref <200)
HDL: 59 mg/dL (ref >50)
LDL CHOLESTEROL (CALC): 95 mg/dL
NON-HDL CHOLESTEROL (CALC): 109 mg/dL (ref <130)
Triglycerides: 46 mg/dL (ref <150)

## 2016-12-12 LAB — VITAMIN D 25 HYDROXY (VIT D DEFICIENCY, FRACTURES): Vit D, 25-Hydroxy: 39 ng/mL (ref 30–100)

## 2016-12-12 LAB — HEMOGLOBIN A1C
Hgb A1c MFr Bld: 5.8 % of total Hgb — ABNORMAL HIGH (ref <5.7)
MEAN PLASMA GLUCOSE: 120 (calc)
eAG (mmol/L): 6.6 (calc)

## 2016-12-12 LAB — POC HEMOCCULT BLD/STL (OFFICE/1-CARD/DIAGNOSTIC): FECAL OCCULT BLD: NEGATIVE

## 2016-12-12 NOTE — Patient Instructions (Signed)
F/u in 6 . Months, call if you need me before  Good labs  You are referred to hematology re bruising and painful purple spots on legs for past 1 year   Shingles vaccine today  Thank you  for choosing [facility]. We consider it a privelige to serve you.  Delivering excellent health care in a caring and  compassionate way is our goal.  Partnering with you,  so that together we can achieve this goal is our strategy.

## 2016-12-13 ENCOUNTER — Encounter: Payer: Self-pay | Admitting: Allergy & Immunology

## 2016-12-13 ENCOUNTER — Ambulatory Visit: Payer: BC Managed Care – PPO | Admitting: Allergy & Immunology

## 2016-12-13 ENCOUNTER — Encounter: Payer: Self-pay | Admitting: Family Medicine

## 2016-12-13 VITALS — BP 122/78 | HR 83 | Resp 18

## 2016-12-13 DIAGNOSIS — J3089 Other allergic rhinitis: Secondary | ICD-10-CM

## 2016-12-13 DIAGNOSIS — J302 Other seasonal allergic rhinitis: Secondary | ICD-10-CM

## 2016-12-13 DIAGNOSIS — J4540 Moderate persistent asthma, uncomplicated: Secondary | ICD-10-CM

## 2016-12-13 MED ORDER — FLUTICASONE FUROATE-VILANTEROL 100-25 MCG/INH IN AEPB: 1.0000 | INHALATION_SPRAY | Freq: Every day | RESPIRATORY_TRACT | 5 refills | Status: DC

## 2016-12-13 MED ORDER — AZELASTINE-FLUTICASONE 137-50 MCG/ACT NA SUSP: 2.0000 | Freq: Two times a day (BID) | NASAL | 5 refills | Status: DC

## 2016-12-13 NOTE — Progress Notes (Signed)
FOLLOW UP  Date of Service/Encounter:  12/13/16   Assessment:   Moderate persistent asthma, uncomplicated  Seasonal and perennial allergic rhinitis (grasses, weeds, trees, molds, dust mites, cat, dog, cockroach)   Asthma Reportables:  Severity: moderate persistent  Risk: low Control: well controlled   Plan/Recommendations:   1. Moderate persistent asthma, uncomplicated - It seems that the addition of the Breo has helped with your symptoms.  - Daily controller medication(s): Breo 100/25 one puff once daily - Prior to physical activity: ProAir 2 puffs 10-15 minutes before physical activity. - Rescue medications: ProAir 4 puffs every 4-6 hours as needed - Asthma control goals:  * Full participation in all desired activities (may need albuterol before activity) * Albuterol use two time or less a week on average (not counting use with activity) * Cough interfering with sleep two time or less a month * Oral steroids no more than once a year * No hospitalizations  2. Seasonal and perennial allergic rhinitis - Testing today showed: trees, weeds, grasses, indoor molds, outdoor molds, dust mites, cat and cockroach - Avoidance measures provided - Continue with: Zyrtec (cetirizine) 10mg  tablet once daily and Dymista (fluticasone/azelastine) two sprays per nostril 1-2 times daily as needed - You can use an extra dose of the antihistamine, if needed, for breakthrough symptoms.  - Consider nasal saline rinses 1-2 times daily to remove allergens from the nasal cavities as well as help with mucous clearance (this is especially helpful to do before the nasal sprays are given) - We will mix allergy shots as a means of long-term control. - You can make an appointment in 2 weeks for your first injection.   3. Return in about 3 months (around 03/15/2017).  Subjective:   TORRANCE FRECH is a 61 y.o. female presenting today for follow up of  Chief Complaint  Patient presents with  .  Allergy Testing    Fuller Plan Skeen has a history of the following: Patient Active Problem List   Diagnosis Date Noted  . GERD (gastroesophageal reflux disease) 12/18/2012  . HTN (hypertension) 09/13/2011  . Hemorrhoid 10/06/2010  . Osteopenia 09/20/2010  . GANGLION CYST 03/17/2010  . Prediabetes 10/08/2009  . SPRAIN AND STRAIN OF INTERPHALANGEAL OF HAND 08/17/2009  . Environmental allergies 01/11/2008  . Cough variant asthma 01/11/2008  . Hyperlipidemia LDL goal <100 08/16/2007    History obtained from: chart review and patient.  Rayetta Pigg P Lahti's Primary Care Provider is Fayrene Helper, MD.     Fairy is a 61 y.o. female presenting for a skin testing. She was last seen in October 23rd at which time were unable to do testing because she had taken antihistamines. Therefore we rescheduled her for today. At the last visit, we started her on Dymista 2 sprays per nostril daily and continued her on Mucinex daily as needed. We also started her on a prednisone burst due to her acutely worsening symptoms. Of note, we did find her chart, which showed that she last had testing performed in March 2011 that showed sensitizations to cockroach, dust mite, and mold mix # 4. She also had testing performed in November 2007 that was strongly reactive to grasses, weed mix, dog, cockroach, and dust mite. She also had symptoms consistent with reactive airway dysfunction syndrome, and we started her on Breo 100/25 one puff once daily as well as ProAir as needed.   Since the last visit, she has done very well. She reports that the use of the [place] on  a daily basis has helped tremendously and she fees that she is breathing well. Her Dymista - although it tastes terrible - has been efficacious in improving her nasal symptoms. She remains interested in allergy shots, as she does not like to take anything on a daily basis. She had quit taking them last time since she did not feel that they were working at  all. However, in retrospect she thinks that they were doing a good job.   Otherwise, there have been no changes to her past medical history, surgical history, family history, or social history.    Review of Systems: a 14-point review of systems is pertinent for what is mentioned in HPI.  Otherwise, all other systems were negative. Constitutional: negative other than that listed in the HPI Eyes: negative other than that listed in the HPI Ears, nose, mouth, throat, and face: negative other than that listed in the HPI Respiratory: negative other than that listed in the HPI Cardiovascular: negative other than that listed in the HPI Gastrointestinal: negative other than that listed in the HPI Genitourinary: negative other than that listed in the HPI Integument: negative other than that listed in the HPI Hematologic: negative other than that listed in the HPI Musculoskeletal: negative other than that listed in the HPI Neurological: negative other than that listed in the HPI Allergy/Immunologic: negative other than that listed in the HPI    Objective:   Blood pressure 122/78, pulse 83, resp. rate 18, SpO2 97 %. There is no height or weight on file to calculate BMI.   Physical Exam:  General: Alert, interactive, in no acute distress. Pleasant and adorable.   Eyes: No conjunctival injection bilaterally, no discharge on the right, no discharge on the left and no Horner-Trantas dots present. PERRL bilaterally. EOMI without pain. No photophobia.  Ears: Right TM pearly gray with normal light reflex, Left TM pearly gray with normal light reflex, Right TM intact without perforation and Left TM intact without perforation.  Nose/Throat: External nose within normal limits and septum midline. Turbinates markedly edematous and pale with clear discharge. Posterior oropharynx erythematous with cobblestoning in the posterior oropharynx. Tonsils 2+ without exudates.  Tongue without thrush. Adenopathy:  shoddy bilateral anterior cervical lymphadenopathy and no enlarged lymph nodes appreciated in the occipital, axillary, epitrochlear, inguinal, or popliteal regions. Lungs: Clear to auscultation without wheezing, rhonchi or rales. No increased work of breathing. CV: Normal S1/S2. No murmurs. Capillary refill <2 seconds.  Skin: Warm and dry, without lesions or rashes. Neuro:   Grossly intact. No focal deficits appreciated. Responsive to questions.  Diagnostic studies:   Allergy Studies:   Indoor/Outdoor Percutaneous Adult Environmental Panel: positive to bahia grass, [place] grass, [place] blue grass, meadow fescue grass, timothy grass and cockroach. Otherwise negative with adequate controls.  Indoor/Outdoor Selected Intradermal Environmental Panel: positive to ragweed mix, weed mix, tree mix, mold mix #3, mold mix #4, cat and mite mix. Otherwise negative with adequate controls.      [name], MD [facility]

## 2016-12-13 NOTE — Addendum Note (Signed)
Addended by: Enis Gash on: 12/13/2016 02:04 PM   Modules accepted: Orders

## 2016-12-13 NOTE — Patient Instructions (Addendum)
1. Moderate persistent asthma, uncomplicated - It seems that the addition of the Breo has helped with your symptoms.  - Daily controller medication(s): Breo 100/25 one puff once daily - Prior to physical activity: ProAir 2 puffs 10-15 minutes before physical activity. - Rescue medications: ProAir 4 puffs every 4-6 hours as needed - Asthma control goals:  * Full participation in all desired activities (may need albuterol before activity) * Albuterol use two time or less a week on average (not counting use with activity) * Cough interfering with sleep two time or less a month * Oral steroids no more than once a year * No hospitalizations  2. Seasonal and perennial allergic rhinitis - Testing today showed: trees, weeds, grasses, indoor molds, outdoor molds, dust mites, cat and cockroach - Avoidance measures provided - Continue with: Zyrtec (cetirizine) 10mg  tablet once daily and Dymista (fluticasone/azelastine) two sprays per nostril 1-2 times daily as needed - You can use an extra dose of the antihistamine, if needed, for breakthrough symptoms.  - Consider nasal saline rinses 1-2 times daily to remove allergens from the nasal cavities as well as help with mucous clearance (this is especially helpful to do before the nasal sprays are given) - We will mix allergy shots as a means of long-term control. - You can make an appointment in 2 weeks for your first injection.   3. Return in about 3 months (around 03/15/2017).   Please inform us of any Emergency Department visits, hospitalizations, or changes in symptoms. Call us before going to the ED for breathing or allergy symptoms since we might be able to fit you in for a sick visit. Feel free to contact us anytime with any questions, problems, or concerns.  It was a pleasure to see you again today! Your grand babies are the CUTEST! Enjoy the fall season!  Websites that have reliable patient information: 1. American Academy of Asthma, Allergy,  and Immunology: www.aaaai.org 2. Food Allergy Research and Education (FARE): foodallergy.org 3. Mothers of Asthmatics: http://www.asthmacommunitynetwork.org 4. American College of Allergy, Asthma, and Immunology: www.acaai.org  Reducing Pollen Exposure  The American Academy of Allergy, Asthma and Immunology suggests the following steps to reduce your exposure to pollen during allergy seasons.    1. Do not hang sheets or clothing out to dry; pollen may collect on these items. 2. Do not mow lawns or spend time around freshly cut grass; mowing stirs up pollen. 3. Keep windows closed at night.  Keep car windows closed while driving. 4. Minimize morning activities outdoors, a time when pollen counts are usually at their highest. 5. Stay indoors as much as possible when pollen counts or humidity is high and on windy days when pollen tends to remain in the air longer. 6. Use air conditioning when possible.  Many air conditioners have filters that trap the pollen spores. 7. Use a HEPA room air filter to remove pollen form the indoor air you breathe.  Control of Mold Allergen   Mold and fungi can grow on a variety of surfaces provided certain temperature and moisture conditions exist.  Outdoor molds grow on plants, decaying vegetation and soil.  The major outdoor mold, Alternaria and Cladosporium, are found in very high numbers during hot and dry conditions.  Generally, a late Summer - Fall peak is seen for common outdoor fungal spores.  Rain will temporarily lower outdoor mold spore count, but counts rise rapidly when the rainy period ends.  The most important indoor molds are Aspergillus and Penicillium.  Dark, humid and poorly ventilated basements are ideal sites for mold growth.  The next most common sites of mold growth are the bathroom and the kitchen.  Outdoor (Seasonal) Mold Control  Positive outdoor molds via skin testing: Bipolaris (Helminthsporium), Drechslera (Curvalaria) and  Mucor  1. Use air conditioning and keep windows closed 2. Avoid exposure to decaying vegetation. 3. Avoid leaf raking. 4. Avoid grain handling. 5. Consider wearing a face mask if working in moldy areas.  6.   Indoor (Perennial) Mold Control   Positive indoor molds via skin testing: Fusarium, Aureobasidium (Pullulara) and Rhizopus  1. Maintain humidity below 50%. 2. Clean washable surfaces with 5% bleach solution. 3. Remove sources e.g. contaminated carpets.   Control of House Dust Mite Allergen    House dust mites play a major role in allergic asthma and rhinitis.  They occur in environments with high humidity wherever human skin, the food for dust mites is found. High levels have been detected in dust obtained from mattresses, pillows, carpets, upholstered furniture, bed covers, clothes and soft toys.  The principal allergen of the house dust mite is found in its feces.  A gram of dust may contain 1,000 mites and 250,000 fecal particles.  Mite antigen is easily measured in the air during house cleaning activities.    1. Encase mattresses, including the box spring, and pillow, in an air tight cover.  Seal the zipper end of the encased mattresses with wide adhesive tape. 2. Wash the bedding in water of 130 degrees Farenheit weekly.  Avoid cotton comforters/quilts and flannel bedding: the most ideal bed covering is the dacron comforter. 3. Remove all upholstered furniture from the bedroom. 4. Remove carpets, carpet padding, rugs, and non-washable window drapes from the bedroom.  Wash drapes weekly or use plastic window coverings. 5. Remove all non-washable stuffed toys from the bedroom.  Wash stuffed toys weekly. 6. Have the room cleaned frequently with a vacuum cleaner and a damp dust-mop.  The patient should not be in a room which is being cleaned and should wait 1 hour after cleaning before going into the room. 7. Close and seal all heating outlets in the bedroom.  Otherwise, the room  will become filled with dust-laden air.  An electric heater can be used to heat the room. 8. Reduce indoor humidity to less than 50%.  Do not use a humidifier.  Control of Dog or Cat Allergen  Avoidance is the best way to manage a dog or cat allergy. If you have a dog or cat and are allergic to dog or cats, consider removing the dog or cat from the home. If you have a dog or cat but don't want to find it a new home, or if your family wants a pet even though someone in the household is allergic, here are some strategies that may help keep symptoms at bay:  1. Keep the pet out of your bedroom and restrict it to only a few rooms. Be advised that keeping the dog or cat in only one room will not limit the allergens to that room. 2. Don't pet, hug or kiss the dog or cat; if you do, wash your hands with soap and water. 3. High-efficiency particulate air (HEPA) cleaners run continuously in a bedroom or living room can reduce allergen levels over time. 4. Regular use of a high-efficiency vacuum cleaner or a central vacuum can reduce allergen levels. 5. Giving your dog or cat a bath at least once a week can  reduce airborne allergen.  Control of Cockroach Allergen  Cockroach allergen has been identified as an important cause of acute attacks of asthma, especially in urban settings.  There are fifty-five species of cockroach that exist in the [place], however only three, the [place], [place] species produce allergen that can affect patients with Asthma.  Allergens can be obtained from fecal particles, egg casings and secretions from cockroaches.    1. Remove food sources. 2. Reduce access to water. 3. Seal access and entry points. 4. Spray runways with 0.5-1% Diazinon or Chlorpyrifos 5. Blow boric acid power under stoves and refrigerator. 6. Place bait stations (hydramethylnon) at feeding sites.

## 2016-12-14 DIAGNOSIS — J3089 Other allergic rhinitis: Secondary | ICD-10-CM | POA: Diagnosis not present

## 2016-12-14 NOTE — Progress Notes (Signed)
VIALS EXP 12-14-17

## 2016-12-15 ENCOUNTER — Encounter: Payer: Self-pay | Admitting: Family Medicine

## 2016-12-15 DIAGNOSIS — J302 Other seasonal allergic rhinitis: Secondary | ICD-10-CM | POA: Diagnosis not present

## 2016-12-15 LAB — CYTOLOGY - PAP
DIAGNOSIS: NEGATIVE
HPV: NOT DETECTED

## 2016-12-17 ENCOUNTER — Encounter: Payer: Self-pay | Admitting: Family Medicine

## 2016-12-17 NOTE — Progress Notes (Signed)
    Denise Werner     MRN: [id number]      DOB: 10-19-1955  HPI: Patient is in for annual physical exam. No other health concerns are expressed or addressed at the visit. Recent labs,  are reviewed. Immunization is reviewed , and  updated    PE:  BP 134/82   Pulse 76   Resp 16   Ht 5\' 5"  (1.651 m)   Wt 153 lb (69.4 kg)   SpO2 98%   BMI 25.46 kg/m   Pleasant  female, alert and oriented x 3, in no cardio-pulmonary distress. Afebrile. HEENT No facial trauma or asymetry. Sinuses non tender.  Extra occullar muscles intact. External ears normal, tympanic membranes clear. Oropharynx moist, no exudate. Neck: supple, no adenopathy,JVD or thyromegaly.No bruits.  Chest: Clear to ascultation bilaterally.No crackles or wheezes. Non tender to palpation  Breast: No asymetry,no masses or lumps. No tenderness. No nipple discharge or inversion. No axillary or supraclavicular adenopathy  Cardiovascular system; Heart sounds normal,  S1 and  S2 ,no S3.  No murmur, or thrill. Apical beat not displaced Peripheral pulses normal.  Abdomen: Soft, non tender, no organomegaly or masses. No bruits. Bowel sounds normal. No guarding, tenderness or rebound.  Rectal:  Normal sphincter tone. Hemorrhoids present Guaiac negative stool.  GU: External genitalia normal female genitalia , normal female distribution of hair. No lesions. Urethral meatus normal in size, no  Prolapse, no lesions visibly  Present. Bladder non tender. Vagina pink and moist , with no visible lesions , discharge present . Adequate pelvic support no  cystocele or rectocele noted Uterus nabsent, no adnexal masses, no  adnexal tenderness.   Musculoskeletal exam: Full ROM of spine, hips , shoulders and knees. No deformity ,swelling or crepitus noted. No muscle wasting or atrophy.   Neurologic: Cranial nerves 2 to 12 intact. Power, tone ,sensation and reflexes normal throughout. No disturbance in gait. No  tremor.  Skin: Intact, no ulceration, erythema , scaling or rash noted. Pigmentation normal throughout  Psych; Normal mood and affect. Judgement and concentration normal   Assessment & Plan:  Annual physical exam Annual exam as documented. Counseling done  re healthy lifestyle involving commitment to 150 minutes exercise per week, heart healthy diet, and attaining healthy weight.The importance of adequate sleep also discussed.  Immunization and cancer screening needs are specifically addressed at this visit.

## 2016-12-17 NOTE — Assessment & Plan Note (Signed)
Annual exam as documented. Counseling done  re healthy lifestyle involving commitment to 150 minutes exercise per week, heart healthy diet, and attaining healthy weight.The importance of adequate sleep also discussed.  Immunization and cancer screening needs are specifically addressed at this visit.  

## 2016-12-27 ENCOUNTER — Telehealth (INDEPENDENT_AMBULATORY_CARE_PROVIDER_SITE_OTHER): Payer: Self-pay | Admitting: Internal Medicine

## 2016-12-27 ENCOUNTER — Ambulatory Visit (INDEPENDENT_AMBULATORY_CARE_PROVIDER_SITE_OTHER): Payer: BC Managed Care – PPO | Admitting: Internal Medicine

## 2016-12-27 NOTE — Telephone Encounter (Signed)
Patient had an appointment for today and she did come in, but since we were running behind, she chose to cancel.  She had someone covering the front desk at work for her while she came in for her appointment.  She wanted to let Dr. Laural Golden know that she is doing good.  She stated that she took Dr. Olevia Perches advise and doesn't eat late much anymore, her GERD is [facility] better and the medications are helping.   [phone] - home [phone] - cell

## 2017-01-16 ENCOUNTER — Inpatient Hospital Stay (HOSPITAL_COMMUNITY): Admission: RE | Admit: 2017-01-16 | Payer: BC Managed Care – PPO | Source: Ambulatory Visit

## 2017-01-16 ENCOUNTER — Encounter (HOSPITAL_COMMUNITY)
Admission: RE | Admit: 2017-01-16 | Discharge: 2017-01-16 | Disposition: A | Discharge status: Home / Self Care | Payer: BC Managed Care – PPO | Source: Ambulatory Visit | Attending: Family Medicine | Admitting: Family Medicine

## 2017-01-28 ENCOUNTER — Other Ambulatory Visit (INDEPENDENT_AMBULATORY_CARE_PROVIDER_SITE_OTHER): Payer: Self-pay | Admitting: Internal Medicine

## 2017-01-28 DIAGNOSIS — K2100 Gastro-esophageal reflux disease with esophagitis, without bleeding: Secondary | ICD-10-CM

## 2017-02-27 ENCOUNTER — Encounter: Payer: Self-pay | Admitting: Family Medicine

## 2017-02-28 ENCOUNTER — Other Ambulatory Visit: Payer: Self-pay | Admitting: Family Medicine

## 2017-02-28 MED ORDER — IBUPROFEN 800 MG PO TABS: 800.0000 mg | ORAL_TABLET | Freq: Three times a day (TID) | ORAL | 1 refills | Status: DC | PRN

## 2017-03-02 ENCOUNTER — Other Ambulatory Visit: Payer: Self-pay | Admitting: Family Medicine

## 2017-03-08 ENCOUNTER — Telehealth (INDEPENDENT_AMBULATORY_CARE_PROVIDER_SITE_OTHER): Payer: Self-pay | Admitting: *Deleted

## 2017-03-08 ENCOUNTER — Encounter (INDEPENDENT_AMBULATORY_CARE_PROVIDER_SITE_OTHER): Payer: Self-pay | Admitting: Internal Medicine

## 2017-03-08 NOTE — Telephone Encounter (Signed)
Dr.Rehman ask that the patient be set up for a Barium pill Esophagram. She was sent a response through Patient Advice with this information on it. She was advised that you would be calling her to arrange the test.

## 2017-03-09 ENCOUNTER — Encounter (INDEPENDENT_AMBULATORY_CARE_PROVIDER_SITE_OTHER): Payer: Self-pay | Admitting: *Deleted

## 2017-03-09 ENCOUNTER — Other Ambulatory Visit (INDEPENDENT_AMBULATORY_CARE_PROVIDER_SITE_OTHER): Payer: Self-pay | Admitting: Internal Medicine

## 2017-03-09 DIAGNOSIS — R1310 Dysphagia, unspecified: Secondary | ICD-10-CM

## 2017-03-09 NOTE — Telephone Encounter (Signed)
BPE sch'd 03/14/17 at [phone] ([phone]), npo 3 hrs, sent mychart message and left message on home number

## 2017-03-14 ENCOUNTER — Ambulatory Visit (HOSPITAL_COMMUNITY)
Admission: RE | Admit: 2017-03-14 | Discharge: 2017-03-14 | Disposition: A | Discharge status: Home / Self Care | Payer: BC Managed Care – PPO | Source: Ambulatory Visit | Attending: Internal Medicine | Admitting: Internal Medicine

## 2017-03-14 DIAGNOSIS — R1310 Dysphagia, unspecified: Secondary | ICD-10-CM | POA: Insufficient documentation

## 2017-03-15 ENCOUNTER — Other Ambulatory Visit (INDEPENDENT_AMBULATORY_CARE_PROVIDER_SITE_OTHER): Payer: Self-pay | Admitting: Internal Medicine

## 2017-03-15 MED ORDER — DEXLANSOPRAZOLE 60 MG PO CPDR: 60.0000 mg | DELAYED_RELEASE_CAPSULE | Freq: Every day | ORAL | 5 refills | Status: DC

## 2017-03-29 ENCOUNTER — Encounter: Payer: Self-pay | Admitting: Family Medicine

## 2017-03-29 ENCOUNTER — Other Ambulatory Visit: Payer: Self-pay

## 2017-03-29 ENCOUNTER — Ambulatory Visit: Payer: BC Managed Care – PPO | Admitting: Family Medicine

## 2017-03-29 VITALS — BP 140/86 | HR 92 | Temp 99.0°F | Resp 20 | Ht 65.0 in | Wt 158.0 lb

## 2017-03-29 DIAGNOSIS — R6889 Other general symptoms and signs: Secondary | ICD-10-CM

## 2017-03-29 LAB — POCT INFLUENZA A/B
INFLUENZA A, POC: NEGATIVE
INFLUENZA B, POC: NEGATIVE

## 2017-03-29 MED ORDER — BENZONATATE 200 MG PO CAPS: 200.0000 mg | ORAL_CAPSULE | Freq: Two times a day (BID) | ORAL | 0 refills | Status: DC | PRN

## 2017-03-29 MED ORDER — OSELTAMIVIR PHOSPHATE 75 MG PO CAPS: 75.0000 mg | ORAL_CAPSULE | Freq: Two times a day (BID) | ORAL | 0 refills | Status: DC

## 2017-03-29 NOTE — Progress Notes (Signed)
Chief Complaint  Patient presents with  . URI    x 2 days   Patient works in the school system.  She is been exposed to a lot of influenza and viral illness.  She has had 2 days of sore throat and cough.  Along with this she has had sweats, chills, fatigue, malaise, and nausea.  She had the sudden onset of symptoms. No vomiting or diarrhea. She has not taken her temperature. She does not complain of runny or stuffy nose, ear pressure or pain, or headache. She denies underlying asthma, emphysema, or allergies of significance.   Patient Active Problem List   Diagnosis Date Noted  . Annual physical exam 11/04/2014  . GERD (gastroesophageal reflux disease) 12/18/2012  . HTN (hypertension) 09/13/2011  . Hemorrhoid 10/06/2010  . Osteopenia 09/20/2010  . GANGLION CYST 03/17/2010  . Prediabetes 10/08/2009  . Environmental allergies 01/11/2008  . Cough variant asthma 01/11/2008  . Hyperlipidemia LDL goal <100 08/16/2007    Outpatient Encounter Medications as of 03/29/2017  Medication Sig  . amLODipine (NORVASC) 5 MG tablet Take 1 tablet (5 mg total) by mouth daily.  Marland Kitchen azelastine (ASTELIN) 0.1 % nasal spray Place into both nostrils 2 (two) times daily. Use in each nostril as directed  . Azelastine-Fluticasone 137-50 MCG/ACT SUSP Place 2 sprays 2 (two) times daily into the nose.  . Calcium Carbonate-Vit D-Min (CALCIUM 1200) 1200-1000 MG-UNIT CHEW Chew 1 tablet by mouth daily.   Marland Kitchen dexlansoprazole (DEXILANT) 60 MG capsule Take 1 capsule (60 mg total) by mouth daily before breakfast.  . EPINEPHrine (EPIPEN 2-PAK) 0.3 mg/0.3 mL IJ SOAJ injection Inject 0.3 mLs (0.3 mg total) into the muscle once.  . famotidine (PEPCID) 20 MG tablet Take 1 tablet (20 mg total) by mouth at bedtime.  . fluticasone furoate-vilanterol (BREO ELLIPTA) 100-25 MCG/INH AEPB Inhale 1 puff daily into the lungs.  . gabapentin (NEURONTIN) 100 MG capsule One capsule at bedtime as needed  . ibuprofen (ADVIL,MOTRIN) 800 MG  tablet Take 1 tablet (800 mg total) by mouth every 8 (eight) hours as needed.  . levalbuterol (XOPENEX HFA) 45 MCG/ACT inhaler INHALE 2 PUFFS INTO THE LUNGS EVERY 4 HOURS AS NEEDED FOR WHEEZING.  . rosuvastatin (CRESTOR) 5 MG tablet TAKE 1 TABLET BY MOUTH EVERY DAY  . benzonatate (TESSALON) 200 MG capsule Take 1 capsule (200 mg total) by mouth 2 (two) times daily as needed for cough.  Marland Kitchen oseltamivir (TAMIFLU) 75 MG capsule Take 1 capsule (75 mg total) by mouth 2 (two) times daily.   No facility-administered encounter medications on file as of 03/29/2017.     Allergies  Allergen Reactions  . Latex Rash  . Hydrocodone Nausea And Vomiting    Also causes dizziness and states feels like she has an outer body experience.  . Sulfonamide Derivatives     Review of Systems  Constitutional: Positive for activity change, appetite change, chills, fatigue and fever.  HENT: Positive for congestion and sore throat. Negative for postnasal drip, rhinorrhea, sinus pressure and sinus pain.   Eyes: Negative for redness and visual disturbance.  Respiratory: Positive for cough.   Cardiovascular: Negative for chest pain and palpitations.  Gastrointestinal: Positive for nausea. Negative for diarrhea and vomiting.  Musculoskeletal: Positive for myalgias. Negative for arthralgias.  Neurological: Negative for light-headedness and headaches.    BP 140/86 (BP Location: Left Arm, Patient Position: Sitting, Cuff Size: Normal)   Pulse 92   Temp 99 F (37.2 C) (Temporal)   Resp 20  Ht 5\' 5"  (1.651 m)   Wt 158 lb (71.7 kg)   SpO2 98%   BMI 26.29 kg/m   Physical Exam  Constitutional: She is oriented to person, place, and time. She appears well-developed and well-nourished. She appears distressed.  Appears tired, moderately ill  HENT:  Head: Normocephalic and atraumatic.  Right Ear: External ear normal.  Left Ear: External ear normal.  No sinus tenderness.  Posterior pharynx mildly injected.  Nasal membranes  clear.  Eyes: Conjunctivae are normal. Pupils are equal, round, and reactive to light.  Neck: Normal range of motion.  Cardiovascular: Normal rate, regular rhythm and normal heart sounds.  Pulmonary/Chest: Effort normal and breath sounds normal.  Chest is clear  Lymphadenopathy:    She has no cervical adenopathy.  Neurological: She is alert and oriented to person, place, and time.  Psychiatric: She has a normal mood and affect. Her behavior is normal.    ASSESSMENT/PLAN:  1. Flu-like symptoms Discussed this is likely a flulike illness, or influenza, given the sudden onset and the presence of fever chills and malaise.  She has had a lot of fluid exposure.  Even with a negative flu test I will treat her with Tamiflu, symptomatic over-the-counter medications, and rest.  Call if not improving by Monday - POCT Influenza A/B   Patient Instructions  REST Push fluids Take the tamiflu 2 x a day for 5 d Take 2 doses today Take the tessalon as needed for cough Call if not better in a few days    Raylene Everts, MD

## 2017-03-29 NOTE — Patient Instructions (Signed)
REST Push fluids Take the tamiflu 2 x a day for 5 d Take 2 doses today Take the tessalon as needed for cough Call if not better in a few days

## 2017-03-30 ENCOUNTER — Telehealth: Payer: Self-pay

## 2017-03-30 NOTE — Telephone Encounter (Signed)
Called and spoke to [place], [facility] did get the rx for tamiflu, they were just out of stock, but their truck has arrived ..the patient aware.

## 2017-04-04 ENCOUNTER — Telehealth: Payer: Self-pay

## 2017-04-04 ENCOUNTER — Other Ambulatory Visit: Payer: Self-pay | Admitting: Family Medicine

## 2017-04-04 ENCOUNTER — Ambulatory Visit: Payer: BC Managed Care – PPO | Admitting: Allergy & Immunology

## 2017-04-04 ENCOUNTER — Encounter: Payer: Self-pay | Admitting: Family Medicine

## 2017-04-04 DIAGNOSIS — R05 Cough: Secondary | ICD-10-CM

## 2017-04-04 DIAGNOSIS — R059 Cough, unspecified: Secondary | ICD-10-CM

## 2017-04-04 MED ORDER — PROMETHAZINE-DM 6.25-15 MG/5ML PO SYRP: ORAL_SOLUTION | ORAL | 0 refills | Status: DC

## 2017-04-04 MED ORDER — PENICILLIN V POTASSIUM 500 MG PO TABS: 500.0000 mg | ORAL_TABLET | Freq: Three times a day (TID) | ORAL | 0 refills | Status: DC

## 2017-04-04 NOTE — Telephone Encounter (Signed)
-----   Message from Fayrene Helper, MD sent at 04/04/2017 12:59 PM EST ----- Regarding: pls order and label sputum cup for culture and sensitivity, pt to collect at lunchtime  Shortly after 1 pm

## 2017-04-07 ENCOUNTER — Encounter: Payer: Self-pay | Admitting: Family Medicine

## 2017-04-07 LAB — RESPIRATORY CULTURE OR RESPIRATORY AND SPUTUM CULTURE
MICRO NUMBER: 90287782
RESULT:: NORMAL
SPECIMEN QUALITY: ADEQUATE

## 2017-05-01 ENCOUNTER — Telehealth: Payer: Self-pay | Admitting: Family Medicine

## 2017-05-01 DIAGNOSIS — M8580 Other specified disorders of bone density and structure, unspecified site: Secondary | ICD-10-CM

## 2017-05-01 NOTE — Telephone Encounter (Signed)
Patient stated this past Friday that when she went for her annual  IV infusion for osteoporsis at the cancer center the order had expired so she could not get treated. Please re order her Reclast I will sign, she may need updated lab so pls reach out to her with an e mail early in the am, thanks

## 2017-05-02 NOTE — Telephone Encounter (Signed)
I had filled out and faxed order form, as well as ordered necessary labs. Patient aware labs will need to be completed but due to time frame of labs, she will go after [facility] calls to set up her appointment.

## 2017-05-02 NOTE — Addendum Note (Signed)
Addended by: Merceda Elks on: 05/02/2017 11:10 AM   Modules accepted: Orders

## 2017-05-09 LAB — CALCIUM: CALCIUM: 9.5 mg/dL (ref 8.6–10.4)

## 2017-05-09 LAB — CREATININE, SERUM: CREATININE: 0.72 mg/dL (ref 0.50–0.99)

## 2017-05-12 ENCOUNTER — Encounter (HOSPITAL_COMMUNITY)
Admission: RE | Admit: 2017-05-12 | Discharge: 2017-05-12 | Disposition: A | Discharge status: Home / Self Care | Payer: BC Managed Care – PPO | Source: Ambulatory Visit | Attending: Family Medicine | Admitting: Family Medicine

## 2017-05-12 ENCOUNTER — Encounter (HOSPITAL_COMMUNITY): Payer: Self-pay

## 2017-05-12 DIAGNOSIS — M810 Age-related osteoporosis without current pathological fracture: Secondary | ICD-10-CM | POA: Insufficient documentation

## 2017-05-12 MED ORDER — SODIUM CHLORIDE 0.9 % IV SOLN
Freq: Once | INTRAVENOUS | Status: AC
  Administered 2017-05-12: 12:00:00 via INTRAVENOUS

## 2017-05-12 MED ORDER — ZOLEDRONIC ACID 5 MG/100ML IV SOLN
INTRAVENOUS | Status: AC
  Filled 2017-05-12: qty 100

## 2017-05-12 MED ORDER — ZOLEDRONIC ACID 5 MG/100ML IV SOLN
5.0000 mg | Freq: Once | INTRAVENOUS | Status: AC
  Administered 2017-05-12: 5 mg via INTRAVENOUS

## 2017-05-12 NOTE — Progress Notes (Signed)
Patient completed infusion.  No complaints or issues noted.  Has had infusion previous to this and did not have any adverse effects.  Appointment made for 1 year.

## 2017-05-15 ENCOUNTER — Encounter: Payer: Self-pay | Admitting: Family Medicine

## 2017-05-16 ENCOUNTER — Other Ambulatory Visit: Payer: Self-pay | Admitting: Family Medicine

## 2017-05-16 ENCOUNTER — Telehealth: Payer: Self-pay | Admitting: Family Medicine

## 2017-05-16 MED ORDER — ONDANSETRON 4 MG PO TBDP: 4.0000 mg | ORAL_TABLET | Freq: Three times a day (TID) | ORAL | 0 refills | Status: DC | PRN

## 2017-05-16 MED ORDER — DIPHENOXYLATE-ATROPINE 2.5-0.025 MG PO TABS: 1.0000 | ORAL_TABLET | Freq: Four times a day (QID) | ORAL | 0 refills | Status: AC | PRN

## 2017-05-16 NOTE — Telephone Encounter (Signed)
C/o vomiting and diarrhea, x 2 days requesting medication for symptoms , has to leave for [facility] in 2 days for a family funeral. Zofran , and lomotil are sent in and instructions  Given re importance of hydration and BRAT diet

## 2017-05-16 NOTE — Progress Notes (Signed)
zofran and lomotil are prescribed

## 2017-05-17 ENCOUNTER — Telehealth: Payer: Self-pay | Admitting: Family Medicine

## 2017-05-17 NOTE — Telephone Encounter (Signed)
Pt called in requesting phenergan suppositories be prescribed, as she was still feeling nauseous and light headed. She has been having v/D, reports reduced oral intake, prolabially insufficient water ,and urine darker than usual. Feels weak. Is flying to [place] in am. Was prescribed zofran and lomotil yesterday, denies any V/D today but above symptoms. Since no clinical evaluation and symptoms are concerning for mild dehydration and maybe even biochemical imbalance , I recommend urgent care eval and have not prescribed phenergan suppository. She agrees to go to the urgent care

## 2017-05-23 ENCOUNTER — Telehealth: Payer: Self-pay

## 2017-05-23 DIAGNOSIS — E785 Hyperlipidemia, unspecified: Secondary | ICD-10-CM

## 2017-05-23 DIAGNOSIS — I10 Essential (primary) hypertension: Secondary | ICD-10-CM

## 2017-05-23 DIAGNOSIS — R7303 Prediabetes: Secondary | ICD-10-CM

## 2017-05-23 LAB — COMPLETE METABOLIC PANEL WITH GFR
AG Ratio: 1.5 (calc) (ref 1.0–2.5)
ALKALINE PHOSPHATASE (APISO): 109 U/L (ref 33–130)
ALT: 22 U/L (ref 6–29)
AST: 19 U/L (ref 10–35)
Albumin: 4 g/dL (ref 3.6–5.1)
BUN: 10 mg/dL (ref 7–25)
CO2: 32 mmol/L (ref 20–32)
CREATININE: 0.72 mg/dL (ref 0.50–0.99)
Calcium: 9.4 mg/dL (ref 8.6–10.4)
Chloride: 104 mmol/L (ref 98–110)
GFR, EST NON AFRICAN AMERICAN: 91 mL/min/{1.73_m2} (ref >=60)
GFR, Est African American: 105 mL/min/{1.73_m2} (ref >=60)
GLUCOSE: 100 mg/dL — AB (ref 65–99)
Globulin: 2.7 g/dL (calc) (ref 1.9–3.7)
Potassium: 4.3 mmol/L (ref 3.5–5.3)
Sodium: 140 mmol/L (ref 135–146)
Total Bilirubin: 0.3 mg/dL (ref 0.2–1.2)
Total Protein: 6.7 g/dL (ref 6.1–8.1)

## 2017-05-23 LAB — LIPID PANEL
Cholesterol: 138 mg/dL
HDL: 41 mg/dL — ABNORMAL LOW
LDL Cholesterol (Calc): 85 mg/dL
Non-HDL Cholesterol (Calc): 97 mg/dL
Total CHOL/HDL Ratio: 3.4 (calc)
Triglycerides: 50 mg/dL

## 2017-05-23 NOTE — Telephone Encounter (Signed)
Labs done.

## 2017-05-24 ENCOUNTER — Encounter: Payer: Self-pay | Admitting: Family Medicine

## 2017-05-24 LAB — VITAMIN D 25 HYDROXY (VIT D DEFICIENCY, FRACTURES): VIT D 25 HYDROXY: 36 ng/mL (ref 30–100)

## 2017-05-24 LAB — HEMOGLOBIN A1C
EAG (MMOL/L): 6.8 (calc)
Hgb A1c MFr Bld: 5.9 % of total Hgb — ABNORMAL HIGH (ref <5.7)
Mean Plasma Glucose: 123 (calc)

## 2017-05-25 ENCOUNTER — Other Ambulatory Visit: Payer: Self-pay

## 2017-05-25 ENCOUNTER — Encounter: Payer: Self-pay | Admitting: Family Medicine

## 2017-05-25 ENCOUNTER — Ambulatory Visit (INDEPENDENT_AMBULATORY_CARE_PROVIDER_SITE_OTHER): Payer: BC Managed Care – PPO | Admitting: Family Medicine

## 2017-05-25 VITALS — BP 114/60 | HR 91 | Ht 66.0 in | Wt 152.1 lb

## 2017-05-25 DIAGNOSIS — Z9109 Other allergy status, other than to drugs and biological substances: Secondary | ICD-10-CM | POA: Diagnosis not present

## 2017-05-25 DIAGNOSIS — R7303 Prediabetes: Secondary | ICD-10-CM

## 2017-05-25 DIAGNOSIS — E785 Hyperlipidemia, unspecified: Secondary | ICD-10-CM | POA: Diagnosis not present

## 2017-05-25 DIAGNOSIS — Z23 Encounter for immunization: Secondary | ICD-10-CM

## 2017-05-25 DIAGNOSIS — I10 Essential (primary) hypertension: Secondary | ICD-10-CM

## 2017-05-25 NOTE — Progress Notes (Signed)
Denise Werner     MRN: [id number]      DOB: Jun 10, 1955   HPI Denise Werner is here for follow up and re-evaluation of chronic medical conditions, medication management and review of any available recent lab and radiology data.  Preventive health is updated, specifically  Cancer screening and Immunization.  Will need to locate last colonoscopy to determine when next due, she requests Dr. Laural Golden do her colonoscopy The PT denies any adverse reactions to current medications since the last visit.  There are no new concerns.  There are no specific complaints  Allergies are controlled though she has discontinued immunotherapy Has not been exercising as regularly as she used to and HDL is down  ROS Denies recent fever or chills. Denies sinus pressure, nasal congestion, ear pain or sore throat. Denies chest congestion, productive cough or wheezing. Denies chest pains, palpitations and leg swelling Denies abdominal pain, nausea, vomiting,diarrhea or constipation.   Denies dysuria, frequency, hesitancy or incontinence. Denies uncontrolled joint pain, swelling and limitation in mobility. Denies headaches, seizures, numbness, or tingling. Denies depression, anxiety or insomnia. Denies skin break down or rash.   PE  BP 114/60 (BP Location: Left Arm, Patient Position: Sitting, Cuff Size: Normal)   Pulse 91   Ht 5\' 6"  (1.676 m)   Wt 152 lb 1.3 oz (69 kg)   SpO2 97%   BMI 24.55 kg/m   Patient alert and oriented and in no cardiopulmonary distress.  HEENT: No facial asymmetry, EOMI,   oropharynx pink and moist.  Neck supple no JVD, no mass.  Chest: Clear to auscultation bilaterally.  CVS: S1, S2 no murmurs, no S3.Regular rate.  ABD: Soft non tender.   Ext: No edema  MS: Adequate ROM spine, shoulders, hips and knees.  Skin: Intact, no ulcerations or rash noted.  Psych: Good eye contact, normal affect. Memory intact not anxious or depressed appearing.  CNS: CN 2-12 intact, power,   normal throughout.no focal deficits noted.   Assessment & Plan  HTN (hypertension) Controlled, no change in medication DASH diet and commitment to daily physical activity for a minimum of 30 minutes discussed and encouraged, as a part of hypertension management. The importance of attaining a healthy weight is also discussed.  BP/Weight 05/25/2017 05/12/2017 03/29/2017 12/13/2016 12/12/2016 11/22/2016 04/15/1759  Systolic BP 607 371 062 [phone]  Diastolic BP 60 71 86 78 82 72 82  Wt. (Lbs) 152.08 155 158 - 153 155 155.7  BMI 24.55 25.79 26.29 - 25.46 25.82 25.13       Hyperlipidemia LDL goal <100 Hyperlipidemia:Low fat diet discussed and encouraged.   Lipid Panel  Lab Results  Component Value Date   CHOL 138 05/23/2017   HDL 41 (L) 05/23/2017   LDLCALC 85 05/23/2017   TRIG 50 05/23/2017   CHOLHDL 3.4 05/23/2017    Controlled, no change in medication Needs to increase exercise , HDL is low    Prediabetes Patient educated about the importance of limiting  Carbohydrate intake , the need to commit to daily physical activity for a minimum of 30 minutes , and to commit weight loss. The fact that changes in all these areas will reduce or eliminate all together the development of diabetes is stressed.  Deteriorated , needs to lower carb intake to reduce CV risk  Diabetic Labs Latest Ref Rng & Units 05/23/2017 05/09/2017 12/10/2016 06/22/2016 01/04/2016  HbA1c <5.7 % of total Hgb 5.9(H) - 5.8(H) 5.9(H) 5.8(H)  Chol <200 mg/dL 138 -  168 192 192  HDL >50 mg/dL 41(L) - 59 52 56  Calc LDL mg/dL (calc) 85 - 95 130(H) 127(H)  Triglycerides <150 mg/dL 50 - 46 50 45  Creatinine 0.50 - 0.99 mg/dL 0.72 0.72 0.70 0.85 0.89   BP/Weight 05/25/2017 05/12/2017 03/29/2017 12/13/2016 12/12/2016 11/22/2016 1/58/3094  Systolic BP 076 808 811 031 594 585 929  Diastolic BP 60 71 86 78 82 72 82  Wt. (Lbs) 152.08 155 158 - 153 155 155.7  BMI 24.55 25.79 26.29 - 25.46 25.82 25.13   No flowsheet  data found.    Environmental allergies Denies current symptom flare , uses medication as needed, and has discontinued immunotherapy reportedly  Need for shingles vaccine After obtaining informed consent, the vaccine is  administered by LPN.

## 2017-05-25 NOTE — Patient Instructions (Addendum)
Annual physical exam November 13 or after , call if you need me sooner  Mammogram due in November , please schedule  Shingrix at office    Please commit to more regular exercise , your good cholesterol is low  Please call for  Labs 2 months before next visit  Thank you  for choosing [facility]. We consider it a privelige to serve you.  Delivering excellent health care in a caring and  compassionate way is our goal.  Partnering with you,  so that together we can achieve this goal is our strategy.

## 2017-05-28 ENCOUNTER — Encounter: Payer: Self-pay | Admitting: Family Medicine

## 2017-05-28 DIAGNOSIS — Z23 Encounter for immunization: Secondary | ICD-10-CM | POA: Insufficient documentation

## 2017-05-28 MED ORDER — ROSUVASTATIN CALCIUM 5 MG PO TABS: 5.0000 mg | ORAL_TABLET | Freq: Every day | ORAL | 3 refills | Status: DC

## 2017-05-28 NOTE — Assessment & Plan Note (Signed)
Denies current symptom flare , uses medication as needed, and has discontinued immunotherapy reportedly

## 2017-05-28 NOTE — Assessment & Plan Note (Signed)
Hyperlipidemia:Low fat diet discussed and encouraged.   Lipid Panel  Lab Results  Component Value Date   CHOL 138 05/23/2017   HDL 41 (L) 05/23/2017   LDLCALC 85 05/23/2017   TRIG 50 05/23/2017   CHOLHDL 3.4 05/23/2017    Controlled, no change in medication Needs to increase exercise , HDL is low

## 2017-05-28 NOTE — Assessment & Plan Note (Signed)
After obtaining informed consent, the vaccine is  administered by LPN.  

## 2017-05-28 NOTE — Assessment & Plan Note (Signed)
Patient educated about the importance of limiting  Carbohydrate intake , the need to commit to daily physical activity for a minimum of 30 minutes , and to commit weight loss. The fact that changes in all these areas will reduce or eliminate all together the development of diabetes is stressed.  Deteriorated , needs to lower carb intake to reduce CV risk  Diabetic Labs Latest Ref Rng & Units 05/23/2017 05/09/2017 12/10/2016 06/22/2016 01/04/2016  HbA1c <5.7 % of total Hgb 5.9(H) - 5.8(H) 5.9(H) 5.8(H)  Chol <200 mg/dL 138 - 168 192 192  HDL >50 mg/dL 41(L) - 59 52 56  Calc LDL mg/dL (calc) 85 - 95 130(H) 127(H)  Triglycerides <150 mg/dL 50 - 46 50 45  Creatinine 0.50 - 0.99 mg/dL 0.72 0.72 0.70 0.85 0.89   BP/Weight 05/25/2017 05/12/2017 03/29/2017 12/13/2016 12/12/2016 11/22/2016 02/16/3565  Systolic BP 014 103 013 143 888 757 972  Diastolic BP 60 71 86 78 82 72 82  Wt. (Lbs) 152.08 155 158 - 153 155 155.7  BMI 24.55 25.79 26.29 - 25.46 25.82 25.13   No flowsheet data found.

## 2017-05-28 NOTE — Assessment & Plan Note (Signed)
Controlled, no change in medication DASH diet and commitment to daily physical activity for a minimum of 30 minutes discussed and encouraged, as a part of hypertension management. The importance of attaining a healthy weight is also discussed.  BP/Weight 05/25/2017 05/12/2017 03/29/2017 12/13/2016 12/12/2016 11/22/2016 4/50/3888  Systolic BP 280 034 917 915 056 979 480  Diastolic BP 60 71 86 78 82 72 82  Wt. (Lbs) 152.08 155 158 - 153 155 155.7  BMI 24.55 25.79 26.29 - 25.46 25.82 25.13

## 2017-05-29 ENCOUNTER — Encounter: Payer: Self-pay | Admitting: Family Medicine

## 2017-08-15 ENCOUNTER — Encounter: Payer: Self-pay | Admitting: Family Medicine

## 2017-09-06 ENCOUNTER — Other Ambulatory Visit: Payer: Self-pay

## 2017-09-06 ENCOUNTER — Encounter: Payer: Self-pay | Admitting: Family Medicine

## 2017-09-06 ENCOUNTER — Ambulatory Visit (INDEPENDENT_AMBULATORY_CARE_PROVIDER_SITE_OTHER): Payer: BC Managed Care – PPO | Admitting: Family Medicine

## 2017-09-06 VITALS — BP 128/78 | HR 88 | Resp 12 | Ht 66.0 in | Wt 154.0 lb

## 2017-09-06 DIAGNOSIS — J029 Acute pharyngitis, unspecified: Secondary | ICD-10-CM

## 2017-09-06 DIAGNOSIS — J45991 Cough variant asthma: Secondary | ICD-10-CM | POA: Diagnosis not present

## 2017-09-06 DIAGNOSIS — I10 Essential (primary) hypertension: Secondary | ICD-10-CM

## 2017-09-06 DIAGNOSIS — J209 Acute bronchitis, unspecified: Secondary | ICD-10-CM

## 2017-09-06 DIAGNOSIS — R7303 Prediabetes: Secondary | ICD-10-CM

## 2017-09-06 DIAGNOSIS — Z1231 Encounter for screening mammogram for malignant neoplasm of breast: Secondary | ICD-10-CM

## 2017-09-06 DIAGNOSIS — E785 Hyperlipidemia, unspecified: Secondary | ICD-10-CM

## 2017-09-06 DIAGNOSIS — M5440 Lumbago with sciatica, unspecified side: Secondary | ICD-10-CM

## 2017-09-06 DIAGNOSIS — G8929 Other chronic pain: Secondary | ICD-10-CM

## 2017-09-06 MED ORDER — ALBUTEROL SULFATE HFA 108 (90 BASE) MCG/ACT IN AERS
2.0000 | INHALATION_SPRAY | Freq: Four times a day (QID) | RESPIRATORY_TRACT | 3 refills | Status: DC | PRN
Start: 2017-09-06 — End: 2018-09-14

## 2017-09-06 MED ORDER — MONTELUKAST SODIUM 10 MG PO TABS: 10.0000 mg | ORAL_TABLET | Freq: Every day | ORAL | 3 refills | Status: DC

## 2017-09-06 MED ORDER — FLUTICASONE FUROATE-VILANTEROL 100-25 MCG/INH IN AEPB: 1.0000 | INHALATION_SPRAY | Freq: Every day | RESPIRATORY_TRACT | 11 refills | Status: DC

## 2017-09-06 MED ORDER — BENZONATATE 100 MG PO CAPS: 100.0000 mg | ORAL_CAPSULE | Freq: Two times a day (BID) | ORAL | 0 refills | Status: DC | PRN

## 2017-09-06 MED ORDER — PROMETHAZINE-DM 6.25-15 MG/5ML PO SYRP: ORAL_SOLUTION | ORAL | 0 refills | Status: DC

## 2017-09-06 MED ORDER — PREDNISONE 20 MG PO TABS: ORAL_TABLET | ORAL | 0 refills | Status: DC

## 2017-09-06 MED ORDER — AZITHROMYCIN 250 MG PO TABS: ORAL_TABLET | ORAL | 0 refills | Status: DC

## 2017-09-06 MED ORDER — IBUPROFEN 800 MG PO TABS: 800.0000 mg | ORAL_TABLET | Freq: Three times a day (TID) | ORAL | 1 refills | Status: DC | PRN

## 2017-09-06 NOTE — Progress Notes (Signed)
Denise Werner     MRN: [id number]      DOB: 03/10/1955   HPI Denise Werner is here with a .  2 week h/o increased post nasal drainage and cough, no fever or chills no bad taste to the drainage worse in the supine position Has used oTC products with no benefit    ROS Denies chest pains, palpitations and leg swelling Denies abdominal pain, nausea, vomiting,diarrhea or constipation.   Denies dysuria, frequency, hesitancy or incontinence. Denies joint pain, swelling and limitation in mobility. Denies headaches, seizures, numbness, or tingling. Denies depression, anxiety or insomnia. Denies skin break down or rash.   PE  BP 128/78 (BP Location: Left Arm, Patient Position: Sitting, Cuff Size: Normal)   Pulse 88   Resp 12   Ht 5\' 6"  (1.676 m)   Wt 154 lb (69.9 kg)   SpO2 98% Comment: room air  BMI 24.86 kg/m   Patient alert and oriented and in mild  cardiopulmonary distress.  HEENT: No facial asymmetry, EOMI,   oropharynx pink and moist.  Neck supple no JVD, no mass.No sinus tenderness, TM clear  Chest: decreased air entry, scattered wheezes, few crackles  CVS: S1, S2 no murmurs, no S3.Regular rate.  ABD: Soft non tender.   Ext: No edema  MS: Adequate ROM spine, shoulders, hips and knees.  Skin: Intact, no ulcerations or rash noted.  Psych: Good eye contact, normal affect. Memory intact not anxious or depressed appearing.  CNS: CN 2-12 intact, power,  normal throughout.no focal deficits noted.   Assessment & Plan  Cough variant asthma Current flare, depo medrol administered and prednisone dose pack is prescribed  Acute bronchitis z pack and phenergan are prrescribed  HTN (hypertension) Controlled, no change in medication DASH diet and commitment to daily physical activity for a minimum of 30 minutes discussed and encouraged, as a part of hypertension management. The importance of attaining a healthy weight is also discussed.  BP/Weight 09/06/2017 05/25/2017  05/12/2017 03/29/2017 12/13/2016 12/12/2016 90/24/0973  Systolic BP 532 992 426 834 196 222 979  Diastolic BP 78 60 71 86 78 82 72  Wt. (Lbs) 154 152.08 155 158 - 153 155  BMI 24.86 24.55 25.79 26.29 - 25.46 25.82       Hyperlipidemia LDL goal <100 Hyperlipidemia:Low fat diet discussed and encouraged.   Lipid Panel  Lab Results  Component Value Date   CHOL 138 05/23/2017   HDL 41 (L) 05/23/2017   LDLCALC 85 05/23/2017   TRIG 50 05/23/2017   CHOLHDL 3.4 05/23/2017   Controlled, no change in medication Updated lab needed at/ before next visit.     Prediabetes Patient educated about the importance of limiting  Carbohydrate intake , the need to commit to daily physical activity for a minimum of 30 minutes , and to commit weight loss. The fact that changes in all these areas will reduce or eliminate all together the development of diabetes is stressed.  Updated lab needed at/ before next visit.  Diabetic Labs Latest Ref Rng & Units 05/23/2017 05/09/2017 12/10/2016 06/22/2016 01/04/2016  HbA1c <5.7 % of total Hgb 5.9(H) - 5.8(H) 5.9(H) 5.8(H)  Chol <200 mg/dL 138 - 168 192 192  HDL >50 mg/dL 41(L) - 59 52 56  Calc LDL mg/dL (calc) 85 - 95 130(H) 127(H)  Triglycerides <150 mg/dL 50 - 46 50 45  Creatinine 0.50 - 0.99 mg/dL 0.72 0.72 0.70 0.85 0.89   BP/Weight 09/06/2017 05/25/2017 05/12/2017 03/29/2017 12/13/2016 12/12/2016 89/21/1941  Systolic  BP 128 114 137 140 945 038 882  Diastolic BP 78 60 71 86 78 82 72  Wt. (Lbs) 154 152.08 155 158 - 153 155  BMI 24.86 24.55 25.79 26.29 - 25.46 25.82   No flowsheet data found.     Sore throat Physical exam negative for signiofifcant erythema or exudate Rapid strep ; negative  Back pain Intermittent episodes of low back pain, has established arthritis, will send ibuprofen for as needed use No current flare

## 2017-09-06 NOTE — Patient Instructions (Addendum)
Physical exam in November as before, call if you need me sooner  Your mammogram is due , and we have scheduled this for you    You are treated for bronchitis and uncontrolled  asthma and allergies       Depomedrol is administered  Prednisone, Z pack, tessalon perles.Breo, singulair, albuterol , phenergan dM  Are prescribed   Ibuprofen is prescribed for arhtritis for as needed use

## 2017-09-07 ENCOUNTER — Encounter: Payer: Self-pay | Admitting: Family Medicine

## 2017-09-07 DIAGNOSIS — J45991 Cough variant asthma: Secondary | ICD-10-CM | POA: Diagnosis not present

## 2017-09-07 LAB — POCT RAPID STREP A (OFFICE): RAPID STREP A SCREEN: NEGATIVE

## 2017-09-07 MED ORDER — IPRATROPIUM BROMIDE 0.02 % IN SOLN
0.5000 mg | Freq: Once | RESPIRATORY_TRACT | Status: AC
  Administered 2017-09-07: 0.5 mg via RESPIRATORY_TRACT

## 2017-09-07 MED ORDER — ALBUTEROL SULFATE (2.5 MG/3ML) 0.083% IN NEBU
2.5000 mg | INHALATION_SOLUTION | Freq: Once | RESPIRATORY_TRACT | Status: AC
  Administered 2017-09-07: 2.5 mg via RESPIRATORY_TRACT

## 2017-09-07 MED ORDER — METHYLPREDNISOLONE ACETATE 80 MG/ML IJ SUSP
80.0000 mg | Freq: Once | INTRAMUSCULAR | Status: AC
  Administered 2017-09-07: 80 mg via INTRAMUSCULAR

## 2017-09-13 ENCOUNTER — Telehealth: Payer: Self-pay

## 2017-09-13 DIAGNOSIS — I10 Essential (primary) hypertension: Secondary | ICD-10-CM

## 2017-09-13 DIAGNOSIS — J029 Acute pharyngitis, unspecified: Secondary | ICD-10-CM | POA: Insufficient documentation

## 2017-09-13 DIAGNOSIS — R7303 Prediabetes: Secondary | ICD-10-CM

## 2017-09-13 DIAGNOSIS — E785 Hyperlipidemia, unspecified: Secondary | ICD-10-CM

## 2017-09-13 NOTE — Assessment & Plan Note (Signed)
z pack and phenergan are prrescribed

## 2017-09-13 NOTE — Assessment & Plan Note (Addendum)
Intermittent episodes of low back pain, has established arthritis, will send ibuprofen for as needed use No current flare

## 2017-09-13 NOTE — Assessment & Plan Note (Signed)
Physical exam negative for signiofifcant erythema or exudate Rapid strep ; negative

## 2017-09-13 NOTE — Telephone Encounter (Signed)
Labs ordered.

## 2017-09-13 NOTE — Assessment & Plan Note (Signed)
Hyperlipidemia:Low fat diet discussed and encouraged.   Lipid Panel  Lab Results  Component Value Date   CHOL 138 05/23/2017   HDL 41 (L) 05/23/2017   LDLCALC 85 05/23/2017   TRIG 50 05/23/2017   CHOLHDL 3.4 05/23/2017   Controlled, no change in medication Updated lab needed at/ before next visit.

## 2017-09-13 NOTE — Assessment & Plan Note (Signed)
Controlled, no change in medication DASH diet and commitment to daily physical activity for a minimum of 30 minutes discussed and encouraged, as a part of hypertension management. The importance of attaining a healthy weight is also discussed.  BP/Weight 09/06/2017 05/25/2017 05/12/2017 03/29/2017 12/13/2016 12/12/2016 59/97/7414  Systolic BP 239 532 023 343 568 616 837  Diastolic BP 78 60 71 86 78 82 72  Wt. (Lbs) 154 152.08 155 158 - 153 155  BMI 24.86 24.55 25.79 26.29 - 25.46 25.82

## 2017-09-13 NOTE — Assessment & Plan Note (Signed)
Current flare, depo medrol administered and prednisone dose pack is prescribed

## 2017-09-13 NOTE — Assessment & Plan Note (Signed)
Patient educated about the importance of limiting  Carbohydrate intake , the need to commit to daily physical activity for a minimum of 30 minutes , and to commit weight loss. The fact that changes in all these areas will reduce or eliminate all together the development of diabetes is stressed.  Updated lab needed at/ before next visit.  Diabetic Labs Latest Ref Rng & Units 05/23/2017 05/09/2017 12/10/2016 06/22/2016 01/04/2016  HbA1c <5.7 % of total Hgb 5.9(H) - 5.8(H) 5.9(H) 5.8(H)  Chol <200 mg/dL 138 - 168 192 192  HDL >50 mg/dL 41(L) - 59 52 56  Calc LDL mg/dL (calc) 85 - 95 130(H) 127(H)  Triglycerides <150 mg/dL 50 - 46 50 45  Creatinine 0.50 - 0.99 mg/dL 0.72 0.72 0.70 0.85 0.89   BP/Weight 09/06/2017 05/25/2017 05/12/2017 03/29/2017 12/13/2016 12/12/2016 40/09/6759  Systolic BP 950 932 671 245 809 983 382  Diastolic BP 78 60 71 86 78 82 72  Wt. (Lbs) 154 152.08 155 158 - 153 155  BMI 24.86 24.55 25.79 26.29 - 25.46 25.82   No flowsheet data found.

## 2017-10-09 ENCOUNTER — Encounter: Payer: Self-pay | Admitting: Allergy & Immunology

## 2017-10-19 ENCOUNTER — Other Ambulatory Visit: Payer: Self-pay | Admitting: Family Medicine

## 2017-10-19 MED ORDER — AMLODIPINE BESYLATE 5 MG PO TABS: 5.0000 mg | ORAL_TABLET | Freq: Every day | ORAL | 3 refills | Status: DC

## 2017-10-31 ENCOUNTER — Encounter: Payer: Self-pay | Admitting: Allergy & Immunology

## 2017-10-31 ENCOUNTER — Ambulatory Visit: Payer: BC Managed Care – PPO | Admitting: Allergy & Immunology

## 2017-10-31 VITALS — BP 108/68 | HR 95 | Temp 97.8°F | Resp 18 | Ht 64.0 in | Wt 152.0 lb

## 2017-10-31 DIAGNOSIS — J3089 Other allergic rhinitis: Secondary | ICD-10-CM | POA: Diagnosis not present

## 2017-10-31 DIAGNOSIS — J4540 Moderate persistent asthma, uncomplicated: Secondary | ICD-10-CM | POA: Diagnosis not present

## 2017-10-31 DIAGNOSIS — J302 Other seasonal allergic rhinitis: Secondary | ICD-10-CM

## 2017-10-31 MED ORDER — FLUTICASONE PROPIONATE 93 MCG/ACT NA EXHU: 2.0000 | INHALANT_SUSPENSION | Freq: Two times a day (BID) | NASAL | 5 refills | Status: DC

## 2017-10-31 NOTE — Progress Notes (Signed)
FOLLOW UP  Date of Service/Encounter:  10/31/17   Assessment:   Moderate persistent asthma, uncomplicated   Seasonal and perennial allergic rhinitis (trees, weeds, grasses, indoor molds, outdoor molds, dust mites, cat and cockroach)  Plan/Recommendations:   1. Moderate persistent asthma, uncomplicated - Lung testing looks great today. - Daily controller medication(s): Breo 100/25 one puff once daily - Prior to physical activity: ProAir 2 puffs 10-15 minutes before physical activity. - Rescue medications: ProAir 4 puffs every 4-6 hours as needed - Asthma control goals:  * Full participation in all desired activities (may need albuterol before activity) * Albuterol use two time or less a week on average (not counting use with activity) * Cough interfering with sleep two time or less a month * Oral steroids no more than once a year * No hospitalizations  2. Seasonal and perennial allergic rhinitis (trees, weeds, grasses, indoor molds, outdoor molds, dust mites, cat and cockroach) - We will need to remix the shots and you can start in a couple of weeks.   - Start the prednisone pack provided. - Stop the Dymista and start Xhance two sprays per nostril twice daily. - We will send in the script to the [facility] outpatient pharmacy, and they will call you to confirm your shipping address. - You can review how to use the device here: https://www.xhance.com - Ask to be enrolled in the auto-refill program so you can get a year for free. - Continue with: Zyrtec (cetirizine) 10mg  tablet once daily - You can use an extra dose of the antihistamine, if needed, for breakthrough symptoms.  - Consider nasal saline rinses 1-2 times daily to remove allergens from the nasal cavities as well as help with mucous clearance (this is especially helpful to do before the nasal sprays are given)  3. Return in about 3 months (around 01/31/2018).   Subjective:   Denise Werner is a 62 y.o. female  presenting today for follow up of  Chief Complaint  Patient presents with  . Asthma  . Cough    Denise Werner has a history of the following: Patient Active Problem List   Diagnosis Date Noted  . Sore throat 09/13/2017  . Acute bronchitis 02/20/2014  . Back pain 07/08/2013  . GERD (gastroesophageal reflux disease) 12/18/2012  . HTN (hypertension) 09/13/2011  . Hemorrhoid 10/06/2010  . Osteopenia 09/20/2010  . GANGLION CYST 03/17/2010  . Prediabetes 10/08/2009  . Environmental allergies 01/11/2008  . Cough variant asthma 01/11/2008  . Hyperlipidemia LDL goal <100 08/16/2007    History obtained from: chart review and patient.  Denise Werner's Primary Care Provider is Fayrene Helper, MD.     [name] is a 61 y.o. female presenting for a follow up visit.  She was last seen in November 2018.  At that time, she seemed to have done well with her Breo 100/25 mcg 1 puff once daily.  We continued her on this as well as ProAir.  She had environmental allergy testing that showed positives to trees, weeds, grasses, indoor molds, outdoor molds, dust mites, cat and cockroach.  We continued her on Zyrtec 10 mg daily and Dymista 2 sprays per nostril up to twice daily.  We did discuss allergy shots, and she signed consent.  Her allergy shots have since expired.  Since the last visit, she has mostly done well. A few months ago, she developed severe conjunctivitis. She went to see an opthalmologist and got an antibiotic eye drop. She then continued to  have problems over three weeks and she was finally placed on azithromycin and amoxicillin and a shot of prednisone. This did help for three weeks and she developed the same symptoms three weeks later. Since that time, she has continued to have problems. She will now wake up coughing and tickle in her throat.   Asthma/Respiratory Symptom History: She remains on the Breo one puff once daily (refilled by Dr. Moshe Cipro). She has been on the one  course of prednisone since the last visit during this recent episode that started at the end of July. Otherwise, prior to July, her symptoms were fairly well controlled. ACT is 13 today, indicating poor asthma control.   Allergic Rhinitis Symptom History:  She remains on Dymista as well as cetirizine. She never went through the shots at the last visit since she seemed to feel better. She is now committed to doing allergy shots (this will be the third time that she has expressed strong interest). She notes that having our office open twice weekly will help tremendously with her being able to get her injections, and she now realizes that the injections will help to decrease her severe flares.   Otherwise, there have been no changes to her past medical history, surgical history, family history, or social history.    Review of Systems: a 14-point review of systems is pertinent for what is mentioned in HPI.  Otherwise, all other systems were negative. Constitutional: negative other than that listed in the HPI Eyes: negative other than that listed in the HPI Ears, nose, mouth, throat, and face: negative other than that listed in the HPI Respiratory: negative other than that listed in the HPI Cardiovascular: negative other than that listed in the HPI Gastrointestinal: negative other than that listed in the HPI Genitourinary: negative other than that listed in the HPI Integument: negative other than that listed in the HPI Hematologic: negative other than that listed in the HPI Musculoskeletal: negative other than that listed in the HPI Neurological: negative other than that listed in the HPI Allergy/Immunologic: negative other than that listed in the HPI    Objective:   Blood pressure 108/68, pulse 95, temperature 97.8 F (36.6 C), temperature source Oral, resp. rate 18, height 5\' 4"  (1.626 m), weight 152 lb (68.9 kg), SpO2 97 %. Body mass index is 26.09 kg/m.   Physical Exam:  General:  Alert, interactive, in no acute distress. Pleasant female. Wearing a very colorful dress.  Eyes: No conjunctival injection bilaterally, no discharge on the right, no discharge on the left, no Horner-Trantas dots present and allergic shiners present bilaterally. PERRL bilaterally. EOMI without pain. No photophobia.  Ears: Right TM pearly gray with normal light reflex, Left TM pearly gray with normal light reflex, Right TM intact without perforation and Left TM intact without perforation.  Nose/Throat: External nose within normal limits, nasal crease present and septum midline. Turbinates markedly edematous with clear discharge. Posterior oropharynx erythematous with cobblestoning in the posterior oropharynx. Tonsils 2+ without exudates.  Tongue without thrush. Lungs: Clear to auscultation without wheezing, rhonchi or rales. No increased work of breathing. CV: Normal S1/S2. No murmurs. Capillary refill <2 seconds.  Skin: Warm and dry, without lesions or rashes. Neuro:   Grossly intact. No focal deficits appreciated. Responsive to questions.  Diagnostic studies:   Spirometry: results normal (FEV1: 2.22/195%, FVC: 3.10/193%, FEV1/FVC: 71%).    Spirometry consistent with normal pattern.   Allergy Studies: none      [name], MD  Allergy and Asthma  [facility]

## 2017-10-31 NOTE — Patient Instructions (Addendum)
1. Moderate persistent asthma, uncomplicated - Lung testing looks great today. - Daily controller medication(s): Breo 100/25 one puff once daily - Prior to physical activity: ProAir 2 puffs 10-15 minutes before physical activity. - Rescue medications: ProAir 4 puffs every 4-6 hours as needed - Asthma control goals:  * Full participation in all desired activities (may need albuterol before activity) * Albuterol use two time or less a week on average (not counting use with activity) * Cough interfering with sleep two time or less a month * Oral steroids no more than once a year * No hospitalizations  2. Seasonal and perennial allergic rhinitis (trees, weeds, grasses, indoor molds, outdoor molds, dust mites, cat and cockroach) - We will need to remix the shots and you can start in a couple of weeks.   - Start the prednisone pack provided. - Stop the Dymista and start Xhance two sprays per nostril twice daily. - We will send in the script to the [facility] outpatient pharmacy, and they will call you to confirm your shipping address. - You can review how to use the device here: https://www.xhance.com - Ask to be enrolled in the auto-refill program so you can get a year for free. - Continue with: Zyrtec (cetirizine) 10mg  tablet once daily - You can use an extra dose of the antihistamine, if needed, for breakthrough symptoms.  - Consider nasal saline rinses 1-2 times daily to remove allergens from the nasal cavities as well as help with mucous clearance (this is especially helpful to do before the nasal sprays are given)  3. Return in about 3 months (around 01/31/2018).   Please inform us of any Emergency Department visits, hospitalizations, or changes in symptoms. Call us before going to the ED for breathing or allergy symptoms since we might be able to fit you in for a sick visit. Feel free to contact us anytime with any questions, problems, or concerns.  It was a pleasure to see you again  today!  Websites that have reliable patient information: 1. American Academy of Asthma, Allergy, and Immunology: www.aaaai.org 2. Food Allergy Research and Education (FARE): foodallergy.org 3. Mothers of Asthmatics: http://www.asthmacommunitynetwork.org 4. American College of Allergy, Asthma, and Immunology: MonthlyElectricBill.co.uk   Make sure you are registered to vote! If you have moved or changed any of your contact information, you will need to get this updated before voting!

## 2017-11-03 ENCOUNTER — Encounter: Payer: Self-pay | Admitting: *Deleted

## 2017-11-03 DIAGNOSIS — J3089 Other allergic rhinitis: Secondary | ICD-10-CM

## 2017-11-03 NOTE — Progress Notes (Signed)
VIALS MADE. EXP: 11-04-18. HV 

## 2017-11-06 DIAGNOSIS — J301 Allergic rhinitis due to pollen: Secondary | ICD-10-CM | POA: Diagnosis not present

## 2017-11-10 ENCOUNTER — Encounter: Payer: Self-pay | Admitting: Family Medicine

## 2017-11-10 ENCOUNTER — Other Ambulatory Visit: Payer: Self-pay | Admitting: Family Medicine

## 2017-11-10 DIAGNOSIS — B001 Herpesviral vesicular dermatitis: Secondary | ICD-10-CM

## 2017-11-10 MED ORDER — ACYCLOVIR 400 MG PO TABS: 400.0000 mg | ORAL_TABLET | Freq: Three times a day (TID) | ORAL | 3 refills | Status: AC

## 2017-11-10 NOTE — Progress Notes (Signed)
Zovirax prescribed

## 2017-11-15 ENCOUNTER — Ambulatory Visit (INDEPENDENT_AMBULATORY_CARE_PROVIDER_SITE_OTHER): Payer: BC Managed Care – PPO | Admitting: *Deleted

## 2017-11-15 DIAGNOSIS — J309 Allergic rhinitis, unspecified: Secondary | ICD-10-CM

## 2017-11-15 MED ORDER — EPINEPHRINE 0.3 MG/0.3ML IJ SOAJ: 0.3000 mg | Freq: Once | INTRAMUSCULAR | 1 refills | Status: AC

## 2017-11-15 NOTE — Progress Notes (Signed)
Immunotherapy   Patient Details  Name: HEIDI MACLIN MRN: [id number] Date of Birth: 29-Oct-1955  11/15/2017  Patient started allergy injections.  Grass-RW-Weed-Tree-Cat and Mold-DMite-CR, Blue Vial, 1:100,000 and received 0.05 ml of each. Schedule B 1-2 x per week with 48 hours between injections. Epi-Pen RX sent in and patient instructed on proper use. Consent signed and patient instructions given. Patient waited 30 minutes after injection and no issues.   Maree Erie 11/15/2017, 3:40 PM

## 2017-11-17 ENCOUNTER — Ambulatory Visit (INDEPENDENT_AMBULATORY_CARE_PROVIDER_SITE_OTHER): Payer: BC Managed Care – PPO

## 2017-11-17 ENCOUNTER — Ambulatory Visit: Payer: BC Managed Care – PPO | Admitting: Allergy & Immunology

## 2017-11-17 DIAGNOSIS — J309 Allergic rhinitis, unspecified: Secondary | ICD-10-CM | POA: Diagnosis not present

## 2017-11-22 ENCOUNTER — Ambulatory Visit (INDEPENDENT_AMBULATORY_CARE_PROVIDER_SITE_OTHER): Payer: BC Managed Care – PPO

## 2017-11-22 DIAGNOSIS — J309 Allergic rhinitis, unspecified: Secondary | ICD-10-CM

## 2017-11-24 ENCOUNTER — Ambulatory Visit (INDEPENDENT_AMBULATORY_CARE_PROVIDER_SITE_OTHER): Payer: BC Managed Care – PPO | Admitting: *Deleted

## 2017-11-24 ENCOUNTER — Ambulatory Visit: Payer: BC Managed Care – PPO | Admitting: Allergy & Immunology

## 2017-11-24 DIAGNOSIS — J309 Allergic rhinitis, unspecified: Secondary | ICD-10-CM | POA: Diagnosis not present

## 2017-11-29 ENCOUNTER — Ambulatory Visit (INDEPENDENT_AMBULATORY_CARE_PROVIDER_SITE_OTHER): Payer: BC Managed Care – PPO | Admitting: *Deleted

## 2017-11-29 DIAGNOSIS — J309 Allergic rhinitis, unspecified: Secondary | ICD-10-CM | POA: Diagnosis not present

## 2017-12-01 ENCOUNTER — Ambulatory Visit (INDEPENDENT_AMBULATORY_CARE_PROVIDER_SITE_OTHER): Payer: BC Managed Care – PPO

## 2017-12-01 DIAGNOSIS — J309 Allergic rhinitis, unspecified: Secondary | ICD-10-CM | POA: Diagnosis not present

## 2017-12-06 ENCOUNTER — Ambulatory Visit (INDEPENDENT_AMBULATORY_CARE_PROVIDER_SITE_OTHER): Payer: BC Managed Care – PPO

## 2017-12-06 DIAGNOSIS — J309 Allergic rhinitis, unspecified: Secondary | ICD-10-CM | POA: Diagnosis not present

## 2017-12-08 ENCOUNTER — Ambulatory Visit (INDEPENDENT_AMBULATORY_CARE_PROVIDER_SITE_OTHER): Payer: BC Managed Care – PPO

## 2017-12-08 DIAGNOSIS — J309 Allergic rhinitis, unspecified: Secondary | ICD-10-CM | POA: Diagnosis not present

## 2017-12-10 ENCOUNTER — Other Ambulatory Visit: Payer: Self-pay | Admitting: Family Medicine

## 2017-12-11 ENCOUNTER — Ambulatory Visit (HOSPITAL_COMMUNITY): Payer: BC Managed Care – PPO

## 2017-12-13 ENCOUNTER — Ambulatory Visit (INDEPENDENT_AMBULATORY_CARE_PROVIDER_SITE_OTHER): Payer: BC Managed Care – PPO | Admitting: *Deleted

## 2017-12-13 DIAGNOSIS — J309 Allergic rhinitis, unspecified: Secondary | ICD-10-CM | POA: Diagnosis not present

## 2017-12-15 ENCOUNTER — Encounter: Payer: Self-pay | Admitting: Family Medicine

## 2017-12-15 ENCOUNTER — Ambulatory Visit (INDEPENDENT_AMBULATORY_CARE_PROVIDER_SITE_OTHER): Payer: BC Managed Care – PPO | Admitting: *Deleted

## 2017-12-15 DIAGNOSIS — J309 Allergic rhinitis, unspecified: Secondary | ICD-10-CM | POA: Diagnosis not present

## 2017-12-18 ENCOUNTER — Ambulatory Visit (INDEPENDENT_AMBULATORY_CARE_PROVIDER_SITE_OTHER): Payer: BC Managed Care – PPO | Admitting: Family Medicine

## 2017-12-18 ENCOUNTER — Encounter: Payer: Self-pay | Admitting: Family Medicine

## 2017-12-18 VITALS — BP 126/70 | HR 98 | Resp 12 | Ht 65.0 in | Wt 153.1 lb

## 2017-12-18 DIAGNOSIS — M8580 Other specified disorders of bone density and structure, unspecified site: Secondary | ICD-10-CM | POA: Diagnosis not present

## 2017-12-18 DIAGNOSIS — Z1211 Encounter for screening for malignant neoplasm of colon: Secondary | ICD-10-CM | POA: Diagnosis not present

## 2017-12-18 DIAGNOSIS — J324 Chronic pansinusitis: Secondary | ICD-10-CM

## 2017-12-18 DIAGNOSIS — Z0000 Encounter for general adult medical examination without abnormal findings: Secondary | ICD-10-CM

## 2017-12-18 NOTE — Assessment & Plan Note (Signed)
Osteopenia, dexa past due will schedule repeat scan

## 2017-12-18 NOTE — Assessment & Plan Note (Signed)
Annual exam as documented. Counseling done  re healthy lifestyle involving commitment to 150 minutes exercise per week, heart healthy diet, and attaining healthy weight.The importance of adequate sleep also discussed.  Immunization and cancer screening needs are specifically addressed at this visit.  

## 2017-12-18 NOTE — Patient Instructions (Signed)
F/u in 6 months, call if you need me sooner  You will be contacted re results of blood work and sinus X ray  Please commit to daily exercise for at least 30 minutes   You are referred for your colonoscopy due in Feb 2020  Start simply saline nasal flushes once to twice daily to help with sinus pressure , and you may take one sudafed once daily as needed for 3 days in a row   For good sleep you need to get into bed by 11 , and not nap for long periods on the sofa with background noise  Thank you  for choosing [facility]. We consider it a privelige to serve you.  Delivering excellent health care in a caring and  compassionate way is our goal.  Partnering with you,  so that together we can achieve this goal is our strategy.

## 2017-12-18 NOTE — Assessment & Plan Note (Addendum)
Daily frontal and ethmoid pressure with thick drainage x 1 month, denies fever or chills. Sinus X ray. Start saline flushes twice daily , may use sudafed one daily as needed for 3 to 5 days, continue allergy meds

## 2017-12-18 NOTE — Progress Notes (Signed)
    Denise Werner     MRN: [id number]      DOB: [birth year]  HPI: Patient is in for annual physical exam. 3 week h/o frontal , maxillary and ethmoid pressure causing daily headaches Immunization is reviewed , and  Is up to date   PE: BP 126/70 (BP Location: Left Arm, Patient Position: Sitting, Cuff Size: Normal)   Pulse 98   Resp 12   Ht 5\' 5"  (1.651 m)   Wt 153 lb 1.9 oz (69.5 kg)   SpO2 97% Comment: room air  BMI 25.48 kg/m   Pleasant  female, alert and oriented x 3, in no cardio-pulmonary distress. Afebrile. HEENT No facial trauma or asymetry. Sinuses tender over frontal, maxillary and ethmoid Extra occullar muscles intact, pupils equally reactive to light. External ears normal, tympanic membranes clear. Oropharynx moist, no exudate. Neck: supple, no adenopathy,JVD or thyromegaly.No bruits.  Chest: Clear to ascultation bilaterally.No crackles or wheezes. Non tender to palpation  Breast: No asymetry,no masses or lumps. No tenderness. No nipple discharge or inversion. No axillary or supraclavicular adenopathy  Cardiovascular system; Heart sounds normal,  S1 and  S2 ,no S3.  No murmur, or thrill. Apical beat not displaced Peripheral pulses normal.  Abdomen: Soft, non tender, no organomegaly or masses. No bruits. Bowel sounds normal. No guarding, tenderness or rebound.      Musculoskeletal exam: Full ROM of spine, hips , shoulders and knees. No deformity ,swelling or crepitus noted. No muscle wasting or atrophy.   Neurologic: Cranial nerves 2 to 12 intact. Power, tone , normal throughout. No disturbance in gait. No tremor.  Skin: Intact, no ulceration, erythema , scaling or rash noted. Pigmentation normal throughout  Psych; Normal mood and affect. Judgement and concentration normal   Assessment & Plan:  Sinusitis Daily frontal and ethmoid pressure with thick drainage x 1 month, denies fever or chills. Sinus X ray. Start saline flushes twice  daily , may use sudafed one daily as needed for 3 to 5 days, continue allergy meds   Annual physical exam Annual exam as documented. Counseling done  re healthy lifestyle involving commitment to 150 minutes exercise per week, heart healthy diet, and attaining healthy weight.The importance of adequate sleep also discussed. Immunization and cancer screening needs are specifically addressed at this visit.   Osteopenia Osteopenia, dexa past due will schedule repeat scan

## 2017-12-19 ENCOUNTER — Telehealth: Payer: Self-pay

## 2017-12-19 ENCOUNTER — Encounter (INDEPENDENT_AMBULATORY_CARE_PROVIDER_SITE_OTHER): Payer: Self-pay | Admitting: *Deleted

## 2017-12-19 ENCOUNTER — Encounter: Payer: Self-pay | Admitting: Family Medicine

## 2017-12-19 ENCOUNTER — Ambulatory Visit (HOSPITAL_COMMUNITY)
Admission: RE | Admit: 2017-12-19 | Discharge: 2017-12-19 | Disposition: A | Discharge status: Home / Self Care | Payer: BC Managed Care – PPO | Source: Ambulatory Visit | Attending: Family Medicine | Admitting: Family Medicine

## 2017-12-19 DIAGNOSIS — J324 Chronic pansinusitis: Secondary | ICD-10-CM | POA: Diagnosis not present

## 2017-12-19 DIAGNOSIS — R7303 Prediabetes: Secondary | ICD-10-CM

## 2017-12-19 DIAGNOSIS — I10 Essential (primary) hypertension: Secondary | ICD-10-CM

## 2017-12-19 DIAGNOSIS — E785 Hyperlipidemia, unspecified: Secondary | ICD-10-CM

## 2017-12-19 LAB — COMPLETE METABOLIC PANEL WITH GFR
AG RATIO: 1.2 (calc) (ref 1.0–2.5)
ALT: 88 U/L — AB (ref 6–29)
AST: 59 U/L — ABNORMAL HIGH (ref 10–35)
Albumin: 3.8 g/dL (ref 3.6–5.1)
Alkaline phosphatase (APISO): 115 U/L (ref 33–130)
BILIRUBIN TOTAL: 0.5 mg/dL (ref 0.2–1.2)
BUN: 8 mg/dL (ref 7–25)
CO2: 29 mmol/L (ref 20–32)
Calcium: 9.3 mg/dL (ref 8.6–10.4)
Chloride: 107 mmol/L (ref 98–110)
Creat: 0.8 mg/dL (ref 0.50–0.99)
GFR, EST AFRICAN AMERICAN: 92 mL/min/{1.73_m2} (ref >=60)
GFR, EST NON AFRICAN AMERICAN: 80 mL/min/{1.73_m2} (ref >=60)
Globulin: 3.2 g/dL (calc) (ref 1.9–3.7)
Glucose, Bld: 101 mg/dL — ABNORMAL HIGH (ref 65–99)
POTASSIUM: 4.5 mmol/L (ref 3.5–5.3)
SODIUM: 140 mmol/L (ref 135–146)
TOTAL PROTEIN: 7 g/dL (ref 6.1–8.1)

## 2017-12-19 LAB — LIPID PANEL
Cholesterol: 154 mg/dL (ref <200)
HDL: 35 mg/dL — AB (ref >50)
LDL Cholesterol (Calc): 102 mg/dL (calc) — ABNORMAL HIGH
NON-HDL CHOLESTEROL (CALC): 119 mg/dL (ref <130)
TRIGLYCERIDES: 84 mg/dL (ref <150)
Total CHOL/HDL Ratio: 4.4 (calc) (ref <5.0)

## 2017-12-19 LAB — HEMOGLOBIN A1C
Hgb A1c MFr Bld: 6.2 % of total Hgb — ABNORMAL HIGH (ref <5.7)
Mean Plasma Glucose: 131 (calc)
eAG (mmol/L): 7.3 (calc)

## 2017-12-19 LAB — CBC
HCT: 37.3 % (ref 35.0–45.0)
Hemoglobin: 12 g/dL (ref 11.7–15.5)
MCH: 27.1 pg (ref 27.0–33.0)
MCHC: 32.2 g/dL (ref 32.0–36.0)
MCV: 84.2 fL (ref 80.0–100.0)
MPV: 10.9 fL (ref 7.5–12.5)
Platelets: 276 10*3/uL (ref 140–400)
RBC: 4.43 10*6/uL (ref 3.80–5.10)
RDW: 13.1 % (ref 11.0–15.0)
WBC: 4.5 10*3/uL (ref 3.8–10.8)

## 2017-12-19 LAB — TSH: TSH: 1.82 mIU/L (ref 0.40–4.50)

## 2017-12-19 NOTE — Telephone Encounter (Signed)
-----  Message from Fayrene Helper, MD sent at 12/19/2017  8:00 AM EST ----- pls mail lab order for early April lab draw for fasting lipid, cmp and EGFr and hBA1C, dx hyperlipidemia, transaminitis, prediabetes

## 2017-12-20 ENCOUNTER — Ambulatory Visit (INDEPENDENT_AMBULATORY_CARE_PROVIDER_SITE_OTHER): Payer: BC Managed Care – PPO

## 2017-12-20 ENCOUNTER — Encounter: Payer: Self-pay | Admitting: Family Medicine

## 2017-12-20 DIAGNOSIS — J309 Allergic rhinitis, unspecified: Secondary | ICD-10-CM

## 2017-12-26 ENCOUNTER — Encounter: Payer: Self-pay | Admitting: Family Medicine

## 2018-01-03 ENCOUNTER — Ambulatory Visit (INDEPENDENT_AMBULATORY_CARE_PROVIDER_SITE_OTHER): Payer: BC Managed Care – PPO

## 2018-01-03 ENCOUNTER — Other Ambulatory Visit (INDEPENDENT_AMBULATORY_CARE_PROVIDER_SITE_OTHER): Payer: Self-pay | Admitting: *Deleted

## 2018-01-03 DIAGNOSIS — Z1211 Encounter for screening for malignant neoplasm of colon: Secondary | ICD-10-CM

## 2018-01-03 DIAGNOSIS — J309 Allergic rhinitis, unspecified: Secondary | ICD-10-CM

## 2018-01-05 ENCOUNTER — Ambulatory Visit (INDEPENDENT_AMBULATORY_CARE_PROVIDER_SITE_OTHER): Payer: BC Managed Care – PPO | Admitting: *Deleted

## 2018-01-05 DIAGNOSIS — J309 Allergic rhinitis, unspecified: Secondary | ICD-10-CM

## 2018-01-08 ENCOUNTER — Ambulatory Visit
Admission: RE | Admit: 2018-01-08 | Discharge: 2018-01-08 | Disposition: A | Discharge status: Home / Self Care | Payer: BC Managed Care – PPO | Source: Ambulatory Visit | Attending: Family Medicine | Admitting: Family Medicine

## 2018-01-08 DIAGNOSIS — Z1231 Encounter for screening mammogram for malignant neoplasm of breast: Secondary | ICD-10-CM

## 2018-01-10 ENCOUNTER — Encounter: Payer: Self-pay | Admitting: Family Medicine

## 2018-01-17 ENCOUNTER — Ambulatory Visit (INDEPENDENT_AMBULATORY_CARE_PROVIDER_SITE_OTHER): Payer: BC Managed Care – PPO | Admitting: *Deleted

## 2018-01-17 DIAGNOSIS — J309 Allergic rhinitis, unspecified: Secondary | ICD-10-CM

## 2018-01-19 ENCOUNTER — Ambulatory Visit (INDEPENDENT_AMBULATORY_CARE_PROVIDER_SITE_OTHER): Payer: BC Managed Care – PPO

## 2018-01-19 DIAGNOSIS — J309 Allergic rhinitis, unspecified: Secondary | ICD-10-CM | POA: Diagnosis not present

## 2018-01-29 ENCOUNTER — Ambulatory Visit (HOSPITAL_COMMUNITY)
Admission: RE | Admit: 2018-01-29 | Discharge: 2018-01-29 | Disposition: A | Discharge status: Home / Self Care | Payer: BC Managed Care – PPO | Source: Ambulatory Visit | Attending: Family Medicine | Admitting: Family Medicine

## 2018-01-29 ENCOUNTER — Encounter: Payer: Self-pay | Admitting: Family Medicine

## 2018-01-29 DIAGNOSIS — M8580 Other specified disorders of bone density and structure, unspecified site: Secondary | ICD-10-CM | POA: Diagnosis not present

## 2018-02-02 ENCOUNTER — Ambulatory Visit: Payer: BC Managed Care – PPO | Admitting: Allergy & Immunology

## 2018-02-02 DIAGNOSIS — J309 Allergic rhinitis, unspecified: Secondary | ICD-10-CM

## 2018-02-09 ENCOUNTER — Ambulatory Visit (INDEPENDENT_AMBULATORY_CARE_PROVIDER_SITE_OTHER): Payer: BC Managed Care – PPO

## 2018-02-09 DIAGNOSIS — J309 Allergic rhinitis, unspecified: Secondary | ICD-10-CM

## 2018-02-14 ENCOUNTER — Ambulatory Visit (INDEPENDENT_AMBULATORY_CARE_PROVIDER_SITE_OTHER): Payer: BC Managed Care – PPO

## 2018-02-14 DIAGNOSIS — J309 Allergic rhinitis, unspecified: Secondary | ICD-10-CM | POA: Diagnosis not present

## 2018-02-23 ENCOUNTER — Ambulatory Visit (INDEPENDENT_AMBULATORY_CARE_PROVIDER_SITE_OTHER): Payer: BC Managed Care – PPO

## 2018-02-23 DIAGNOSIS — J309 Allergic rhinitis, unspecified: Secondary | ICD-10-CM

## 2018-02-26 LAB — HM DIABETES EYE EXAM

## 2018-02-28 ENCOUNTER — Ambulatory Visit (INDEPENDENT_AMBULATORY_CARE_PROVIDER_SITE_OTHER): Payer: BC Managed Care – PPO | Admitting: *Deleted

## 2018-02-28 DIAGNOSIS — J309 Allergic rhinitis, unspecified: Secondary | ICD-10-CM

## 2018-03-02 ENCOUNTER — Ambulatory Visit (INDEPENDENT_AMBULATORY_CARE_PROVIDER_SITE_OTHER): Payer: BC Managed Care – PPO

## 2018-03-02 DIAGNOSIS — J309 Allergic rhinitis, unspecified: Secondary | ICD-10-CM | POA: Diagnosis not present

## 2018-03-05 ENCOUNTER — Encounter (INDEPENDENT_AMBULATORY_CARE_PROVIDER_SITE_OTHER): Payer: Self-pay | Admitting: *Deleted

## 2018-03-05 ENCOUNTER — Telehealth (INDEPENDENT_AMBULATORY_CARE_PROVIDER_SITE_OTHER): Payer: Self-pay | Admitting: *Deleted

## 2018-03-05 NOTE — Telephone Encounter (Signed)
Patient needs suprep 

## 2018-03-07 ENCOUNTER — Encounter: Payer: Self-pay | Admitting: Family Medicine

## 2018-03-07 ENCOUNTER — Encounter: Payer: Self-pay | Admitting: Allergy & Immunology

## 2018-03-07 ENCOUNTER — Ambulatory Visit: Payer: BC Managed Care – PPO | Admitting: Allergy & Immunology

## 2018-03-07 VITALS — BP 124/82 | HR 88 | Resp 12

## 2018-03-07 DIAGNOSIS — J3089 Other allergic rhinitis: Secondary | ICD-10-CM | POA: Diagnosis not present

## 2018-03-07 DIAGNOSIS — E507 Other ocular manifestations of vitamin A deficiency: Secondary | ICD-10-CM | POA: Diagnosis not present

## 2018-03-07 DIAGNOSIS — J309 Allergic rhinitis, unspecified: Secondary | ICD-10-CM | POA: Diagnosis not present

## 2018-03-07 DIAGNOSIS — J4540 Moderate persistent asthma, uncomplicated: Secondary | ICD-10-CM | POA: Diagnosis not present

## 2018-03-07 DIAGNOSIS — H10403 Unspecified chronic conjunctivitis, bilateral: Secondary | ICD-10-CM | POA: Diagnosis not present

## 2018-03-07 DIAGNOSIS — J302 Other seasonal allergic rhinitis: Secondary | ICD-10-CM

## 2018-03-07 NOTE — Patient Instructions (Addendum)
1. Moderate persistent asthma, uncomplicated - Lung testing looks great today. - We will make some changes today. - Daily controller medication(s): Breo 100/25 one puff once daily - Prior to physical activity: ProAir 2 puffs 10-15 minutes before physical activity. - Rescue medications: ProAir 4 puffs every 4-6 hours as needed - Asthma control goals:  * Full participation in all desired activities (may need albuterol before activity) * Albuterol use two time or less a week on average (not counting use with activity) * Cough interfering with sleep two time or less a month * Oral steroids no more than once a year * No hospitalizations  2. Seasonal and perennial allergic rhinitis (trees, weeds, grasses, indoor molds, outdoor molds, dust mites, cat and cockroach) - We will not make any medication changes.  - Continue with Xhance two sprays per nostril up to twice daily. - You can review how to use the device here: https://www.xhance.com - Stop the Zyrtec and try Xyzal 5mg  1-2 times daily.  - I will bring up samples of Karbinal to try as well (this is another antihistamine).  - Consider nasal saline rinses 1-2 times daily to remove allergens from the nasal cavities as well as help with mucous clearance (this is especially helpful to do before the nasal sprays are given)  3. Conjunctivitis - We will get some screening labs to rule out autoimmune disease as a cause of your eye irritation. - We will call you in 1-2 days with the results of the testing.  - Add on Pazeo one drop per eye up to twice daily (this will rule out allergies as a cause of his irritation).   4. Return in about 3 months (around 06/05/2018).   Please inform us of any Emergency Department visits, hospitalizations, or changes in symptoms. Call us before going to the ED for breathing or allergy symptoms since we might be able to fit you in for a sick visit. Feel free to contact us anytime with any questions, problems, or  concerns.  It was a pleasure to see you again today!  Websites that have reliable patient information: 1. American Academy of Asthma, Allergy, and Immunology: www.aaaai.org 2. Food Allergy Research and Education (FARE): foodallergy.org 3. Mothers of Asthmatics: http://www.asthmacommunitynetwork.org 4. American College of Allergy, Asthma, and Immunology: MonthlyElectricBill.co.uk   Make sure you are registered to vote! If you have moved or changed any of your contact information, you will need to get this updated before voting!

## 2018-03-07 NOTE — Progress Notes (Signed)
FOLLOW UP  Date of Service/Encounter:  03/07/18   Assessment:   Moderate persistent asthma, uncomplicated   Seasonal and perennial allergic rhinitis (trees, weeds, grasses, indoor molds, outdoor molds, dust mites, cat and cockroach)   Bilateral conjunctivitis with xerophthalmia   Plan/Recommendations:   1. Moderate persistent asthma, uncomplicated - Lung testing looks great today. - We will make some changes today. - Daily controller medication(s): Breo 100/25 one puff once daily - Prior to physical activity: ProAir 2 puffs 10-15 minutes before physical activity. - Rescue medications: ProAir 4 puffs every 4-6 hours as needed - Asthma control goals:  * Full participation in all desired activities (may need albuterol before activity) * Albuterol use two time or less a week on average (not counting use with activity) * Cough interfering with sleep two time or less a month * Oral steroids no more than once a year * No hospitalizations  2. Seasonal and perennial allergic rhinitis (trees, weeds, grasses, indoor molds, outdoor molds, dust mites, cat and cockroach) - We will not make any medication changes.  - Continue with Xhance two sprays per nostril up to twice daily. - You can review how to use the device here: https://www.xhance.com - Stop the Zyrtec and try Xyzal 5mg  1-2 times daily.  - Try Karbinal 7.5 mL up to BID.  - Consider nasal saline rinses 1-2 times daily to remove allergens from the nasal cavities as well as help with mucous clearance (this is especially helpful to do before the nasal sprays are given)  3. Conjunctivitis - We will get some screening labs to rule out autoimmune disease as a cause of your eye irritation. - We will call you in 1-2 days with the results of the testing.  - Add on Pazeo one drop per eye up to twice daily (this will rule out allergies as a cause of his irritation).   4. Return in about 3 months (around 06/05/2018).  Subjective:    Denise Werner is a 63 y.o. female presenting today for follow up of  Chief Complaint  Patient presents with  . Asthma  . Allergic Rhinitis     Denise Werner has a history of the following: Patient Active Problem List   Diagnosis Date Noted  . Special screening for malignant neoplasms, colon 01/03/2018  . Annual physical exam 11/04/2014  . GERD (gastroesophageal reflux disease) 12/18/2012  . HTN (hypertension) 09/13/2011  . Hemorrhoid 10/06/2010  . Osteopenia 09/20/2010  . GANGLION CYST 03/17/2010  . Prediabetes 10/08/2009  . Environmental allergies 01/11/2008  . Cough variant asthma 01/11/2008  . Sinusitis 11/29/2007  . Hyperlipidemia LDL goal <100 08/16/2007    History obtained from: chart review and patient.  Denise Werner's Primary Care Provider is Denise Helper, MD.     [name] is a 63 y.o. female presenting for a follow up visit.  She was last seen in October 2019.  At that time, her lung testing looked fantastic.  We continued Breo 100/25 mcg 1 puff once daily.  For her seasonal and perennial allergic rhinitis, we decided to restart her allergen immunotherapy.  We did give her a prednisone Dosepak to help with her symptoms.  We stopped her Dymista and started Xhance 2 sprays per nostril up to twice daily.  We continued Zyrtec 10 mg daily.  In the interim, she has restarted her allergen immunotherapy and has been compliant with this.  Since the last visit, she has mostly done well.  She remains on the  allergen immunotherapy.  She remains on no spray, but is not sure she is using it correctly.  She is on the Zyrtec, but does not feel it is providing the same amount of relief that it used to.  She is interested in trying something else.  She has been on Allegra and Claritin in the past without improvement.  She does not believe she has been on Shaw was several years ago, so she is willing to try this again.  Liridona is on allergen  immunotherapy. She receives two injections. Immunotherapy script #1 contains ragweed, trees, weeds, grasses and cat. She currently receives 0.38mL of the GREEN vial (1/1,000). Immunotherapy script #2 contains molds, dust mites and cockroach. She currently receives 0.64mL of the GREEN vial (1/1,000). She started shots October of 2019 and not yet reached maintenance.   She does report that she has been having a lot of problems with eye itching and redness since December 2019.  She has been passed around from 1 ophthalmologist to another.  In total, she has seen around 3 or 4 ophthalmologist.  She has been on a multitude of eyedrops with minimal improvement.  She now is scheduled to see Dr. Marilynne Halsted, who apparently specializes in autoimmune conjunctivitis.  She has not had any labs drawn to look for causes of autoimmune conjunctivitis.  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  She does splash her eyes with water quite a bit to keep them moist.  She denies any xerostomia, but she does drink a lot of water.  Otherwise, there have been no changes to her past medical history, surgical history, family history, or social history.    Review of Systems: a 14-point review of systems is pertinent for what is mentioned in HPI.  Otherwise, all other systems were negative.  Constitutional: negative other than that listed in the HPI Eyes: negative other than that listed in the HPI Ears, nose, mouth, throat, and face: negative other than that listed in the HPI Respiratory: negative other than that listed in the HPI Cardiovascular: negative other than that listed in the HPI Gastrointestinal: negative other than that listed in the HPI Genitourinary: negative other than that listed in the HPI Integument: negative other than that listed in the HPI Hematologic: negative other than that listed in the HPI Musculoskeletal: negative other than that listed in the HPI Neurological: negative other than that listed in  the HPI Allergy/Immunologic: negative other than that listed in the HPI    Objective:   Blood pressure 124/82, pulse 88, resp. rate 12, SpO2 95 %. There is no height or weight on file to calculate BMI.   Physical Exam:  General: Alert, interactive, in no acute distress.  Talkative female. Eyes: Conjunctival injection bilaterally with limbal sparing, no discharge on the right, no discharge on the left and no Horner-Trantas dots present. PERRL bilaterally. EOMI without pain. No photophobia.  Ears: Right TM pearly gray with normal light reflex, Left TM pearly gray with normal light reflex, Right TM intact without perforation and Left TM intact without perforation.  Nose/Throat: External nose within normal limits and septum midline. Turbinates edematous with clear discharge. Posterior oropharynx erythematous with cobblestoning in the posterior oropharynx. Tonsils 2+ without exudates.  Tongue without thrush. Lungs: Clear to auscultation without wheezing, rhonchi or rales. No increased work of breathing. CV: Normal S1/S2. No murmurs. Capillary refill <2 seconds.  Skin: Warm and dry, without lesions or rashes. Neuro:   Grossly intact. No focal deficits appreciated. Responsive  to questions.  Diagnostic studies:   Spirometry: results normal (FEV1: 2.34/119%, FVC: 2.73/102%, FEV1/FVC: 85%).    Spirometry consistent with normal pattern.   Allergy Studies: none        [name], MD  [facility]

## 2018-03-08 ENCOUNTER — Encounter: Payer: Self-pay | Admitting: Allergy & Immunology

## 2018-03-08 ENCOUNTER — Telehealth: Payer: Self-pay | Admitting: Family Medicine

## 2018-03-08 ENCOUNTER — Other Ambulatory Visit: Payer: Self-pay | Admitting: Family Medicine

## 2018-03-08 DIAGNOSIS — J302 Other seasonal allergic rhinitis: Secondary | ICD-10-CM | POA: Insufficient documentation

## 2018-03-08 DIAGNOSIS — E507 Other ocular manifestations of vitamin A deficiency: Secondary | ICD-10-CM | POA: Insufficient documentation

## 2018-03-08 DIAGNOSIS — H10402 Unspecified chronic conjunctivitis, left eye: Secondary | ICD-10-CM

## 2018-03-08 DIAGNOSIS — J3089 Other allergic rhinitis: Secondary | ICD-10-CM

## 2018-03-08 MED ORDER — SUPREP BOWEL PREP KIT 17.5-3.13-1.6 GM/177ML PO SOLN: 1.0000 | Freq: Once | ORAL | 0 refills | Status: AC

## 2018-03-08 NOTE — Telephone Encounter (Signed)
Please send for office notes from "my eye doctor and Dr Zenia Resides office in [place]. Please refer pt to ophthalmology at [facility] per her request, I am entering referral, I advised her we will try to get appt info to her by tomorrow

## 2018-03-08 NOTE — Telephone Encounter (Signed)
pls move forward with scheduling appt, you may send the office note I received  And as the others come in from Dr Jorja Loa I will send those, thanks

## 2018-03-08 NOTE — Telephone Encounter (Signed)
I have spoke with both Dr offices ([facility] & Dr Patrici Ranks) and they are sending via fax the office notes

## 2018-03-09 ENCOUNTER — Ambulatory Visit (INDEPENDENT_AMBULATORY_CARE_PROVIDER_SITE_OTHER): Payer: BC Managed Care – PPO | Admitting: *Deleted

## 2018-03-09 DIAGNOSIS — J309 Allergic rhinitis, unspecified: Secondary | ICD-10-CM | POA: Diagnosis not present

## 2018-03-09 LAB — SJOGREN'S SYNDROME ANTIBODS(SSA + SSB)
ENA SSA (RO) AB: 0.8 AI (ref 0.0–0.9)
ENA SSB (LA) Ab: <0.2 AI (ref 0.0–0.9)

## 2018-03-09 LAB — SEDIMENTATION RATE: Sed Rate: 6 mm/hr (ref 0–40)

## 2018-03-09 LAB — ANA W/REFLEX IF POSITIVE: Anti Nuclear Antibody(ANA): NEGATIVE

## 2018-03-09 LAB — C-REACTIVE PROTEIN: CRP: 4 mg/L (ref 0–10)

## 2018-03-13 ENCOUNTER — Encounter: Payer: Self-pay | Admitting: Allergy & Immunology

## 2018-03-13 ENCOUNTER — Other Ambulatory Visit: Payer: Self-pay

## 2018-03-13 MED ORDER — OLOPATADINE HCL 0.7 % OP SOLN: 1.0000 [drp] | OPHTHALMIC | 5 refills | Status: DC

## 2018-03-16 ENCOUNTER — Telehealth (INDEPENDENT_AMBULATORY_CARE_PROVIDER_SITE_OTHER): Payer: Self-pay | Admitting: *Deleted

## 2018-03-16 ENCOUNTER — Ambulatory Visit (INDEPENDENT_AMBULATORY_CARE_PROVIDER_SITE_OTHER): Payer: BC Managed Care – PPO | Admitting: *Deleted

## 2018-03-16 DIAGNOSIS — J309 Allergic rhinitis, unspecified: Secondary | ICD-10-CM

## 2018-03-16 NOTE — Telephone Encounter (Signed)
Referring MD/PCP: simpson   Procedure: tcs  Reason/Indication:  screening  Has patient had this procedure before?  no  If so, when, by whom and where?    Is there a family history of colon cancer?  no  Who?  What age when diagnosed?    Is patient diabetic?   no      Does patient have prosthetic heart valve or mechanical valve?  no  Do you have a pacemaker?  no  Has patient ever had endocarditis? no  Has patient had joint replacement within last 12 months?  no  Is patient constipated or do they take laxatives? no  Does patient have a history of alcohol/drug use?  no  Is patient on blood thinner such as Coumadin, Plavix and/or Aspirin? no  Medications: see epic  Allergies: see epic  Medication Adjustment per Dr Lindi Adie, NP:   Procedure date & time: 04/11/18 at 730

## 2018-03-19 NOTE — Telephone Encounter (Signed)
agree

## 2018-03-23 ENCOUNTER — Ambulatory Visit (INDEPENDENT_AMBULATORY_CARE_PROVIDER_SITE_OTHER): Payer: BC Managed Care – PPO

## 2018-03-23 DIAGNOSIS — J309 Allergic rhinitis, unspecified: Secondary | ICD-10-CM | POA: Diagnosis not present

## 2018-03-30 ENCOUNTER — Ambulatory Visit (INDEPENDENT_AMBULATORY_CARE_PROVIDER_SITE_OTHER): Payer: BC Managed Care – PPO

## 2018-03-30 DIAGNOSIS — J309 Allergic rhinitis, unspecified: Secondary | ICD-10-CM | POA: Diagnosis not present

## 2018-04-04 ENCOUNTER — Other Ambulatory Visit: Payer: Self-pay

## 2018-04-04 MED ORDER — FLUTICASONE PROPIONATE 93 MCG/ACT NA EXHU: 2.0000 | INHALANT_SUSPENSION | Freq: Two times a day (BID) | NASAL | 11 refills | Status: DC

## 2018-04-06 ENCOUNTER — Ambulatory Visit (INDEPENDENT_AMBULATORY_CARE_PROVIDER_SITE_OTHER): Payer: BC Managed Care – PPO

## 2018-04-06 DIAGNOSIS — J309 Allergic rhinitis, unspecified: Secondary | ICD-10-CM

## 2018-04-11 ENCOUNTER — Ambulatory Visit (HOSPITAL_COMMUNITY)
Admission: RE | Admit: 2018-04-11 | Discharge: 2018-04-11 | Disposition: A | Discharge status: Home / Self Care | Payer: BC Managed Care – PPO | Attending: Internal Medicine | Admitting: Internal Medicine

## 2018-04-11 ENCOUNTER — Encounter (HOSPITAL_COMMUNITY)
Admission: RE | Disposition: A | Discharge status: Home / Self Care | Payer: Self-pay | Source: Home / Self Care | Attending: Internal Medicine

## 2018-04-11 ENCOUNTER — Other Ambulatory Visit: Payer: Self-pay

## 2018-04-11 ENCOUNTER — Encounter (HOSPITAL_COMMUNITY): Payer: Self-pay | Admitting: *Deleted

## 2018-04-11 DIAGNOSIS — Z8249 Family history of ischemic heart disease and other diseases of the circulatory system: Secondary | ICD-10-CM | POA: Insufficient documentation

## 2018-04-11 DIAGNOSIS — K219 Gastro-esophageal reflux disease without esophagitis: Secondary | ICD-10-CM | POA: Insufficient documentation

## 2018-04-11 DIAGNOSIS — Z1211 Encounter for screening for malignant neoplasm of colon: Secondary | ICD-10-CM | POA: Insufficient documentation

## 2018-04-11 DIAGNOSIS — M542 Cervicalgia: Secondary | ICD-10-CM | POA: Diagnosis not present

## 2018-04-11 DIAGNOSIS — D123 Benign neoplasm of transverse colon: Secondary | ICD-10-CM

## 2018-04-11 DIAGNOSIS — J45909 Unspecified asthma, uncomplicated: Secondary | ICD-10-CM | POA: Insufficient documentation

## 2018-04-11 DIAGNOSIS — G8929 Other chronic pain: Secondary | ICD-10-CM | POA: Diagnosis not present

## 2018-04-11 DIAGNOSIS — K6289 Other specified diseases of anus and rectum: Secondary | ICD-10-CM | POA: Diagnosis not present

## 2018-04-11 DIAGNOSIS — Z7951 Long term (current) use of inhaled steroids: Secondary | ICD-10-CM | POA: Diagnosis not present

## 2018-04-11 DIAGNOSIS — K5730 Diverticulosis of large intestine without perforation or abscess without bleeding: Secondary | ICD-10-CM

## 2018-04-11 DIAGNOSIS — Z79899 Other long term (current) drug therapy: Secondary | ICD-10-CM | POA: Insufficient documentation

## 2018-04-11 DIAGNOSIS — K644 Residual hemorrhoidal skin tags: Secondary | ICD-10-CM | POA: Diagnosis not present

## 2018-04-11 DIAGNOSIS — Z791 Long term (current) use of non-steroidal anti-inflammatories (NSAID): Secondary | ICD-10-CM | POA: Insufficient documentation

## 2018-04-11 DIAGNOSIS — E785 Hyperlipidemia, unspecified: Secondary | ICD-10-CM | POA: Diagnosis not present

## 2018-04-11 HISTORY — PX: COLONOSCOPY: SHX5424

## 2018-04-11 HISTORY — PX: BIOPSY: SHX5522

## 2018-04-11 SURGERY — COLONOSCOPY
Anesthesia: Moderate Sedation

## 2018-04-11 MED ORDER — SODIUM CHLORIDE 0.9 % IV SOLN
INTRAVENOUS | Status: DC
  Administered 2018-04-11: 07:00:00 via INTRAVENOUS

## 2018-04-11 MED ORDER — MEPERIDINE HCL 50 MG/ML IJ SOLN
INTRAMUSCULAR | Status: DC | PRN
  Administered 2018-04-11 (×2): 25 mg

## 2018-04-11 MED ORDER — MIDAZOLAM HCL 5 MG/5ML IJ SOLN
INTRAMUSCULAR | Status: DC | PRN
  Administered 2018-04-11: 2 mg via INTRAVENOUS

## 2018-04-11 MED ORDER — MEPERIDINE HCL 100 MG/ML IJ SOLN
INTRAMUSCULAR | Status: AC
  Filled 2018-04-11: qty 1

## 2018-04-11 MED ORDER — MIDAZOLAM HCL 5 MG/5ML IJ SOLN
INTRAMUSCULAR | Status: AC
  Filled 2018-04-11: qty 10

## 2018-04-11 NOTE — H&P (Signed)
Denise Werner is an 63 y.o. female.   Chief Complaint: Patient is here for colonoscopy. HPI: Patient is 63 year old Afro-American female who is here for screening colonoscopy.  Last exam was normal 10 years ago.  She denies abdominal pain change in bowel habits or rectal bleeding. Last ibuprofen dose was 1 month ago. Family history is negative for CRC.  Past Medical History:  Diagnosis Date  . Asthma   . Bladder pain   . Chronic neck pain   . Dyslipidemia   . Frequency of urination   . GERD (gastroesophageal reflux disease) OCCASIONAL  . H/O hiatal hernia   . Interstitial cystitis   .    .    .      Past Surgical History:  Procedure Laterality Date  . ABDOMINAL HYSTERECTOMY     W/ BILATERAL SALPINGOOPHECTOMY  . APPENDECTOMY    . BUNIONECTOMY  04-14-2003   LEFT FOOT  W/ POST REMOVAL INTERNAL FIXATION DEVICE  VIA SILVER BUNIONECTOMY  . CHOLECYSTECTOMY  1998  . CYSTO WITH HYDRODISTENSION  09/12/2011   Procedure: CYSTOSCOPY/HYDRODISTENSION;  Surgeon: Ailene Rud, MD;  Location: [facility];  Service: Urology;  Laterality: N/A;  INSTILL M & P AND M & P  . CYSTO/ HOD/ INSTILLATION THERAPY  11-17-2008;   12-02-1999   INTERSTITIAL CYSTITIS  . ESOPHAGOGASTRODUODENOSCOPY N/A 05/01/2013   Procedure: ESOPHAGOGASTRODUODENOSCOPY (EGD);  Surgeon: Rogene Houston, MD;  Location: [facility];  Service: Endoscopy;  Laterality: N/A;  100  . FINGER SURGERY  10-13-2009   left 5th finger ARTHRODESIS  . FOOT ARTHROPLASTY  05-03-2004   FIFTHE TOE RIGHT FOOT  . KNEE ARTHROSCOPY  JAN 2013   LEFT KNEE    Family History  Problem Relation Age of Onset  . Cancer Brother   . Lupus Sister   . Hypertension Sister   . Aneurysm Sister        x4   Social History:  reports that she has never smoked. She has never used smokeless tobacco. She reports that she does not drink alcohol or use drugs.  Allergies:  Allergies  Allergen Reactions  . Latex Rash  . Hydrocodone  Nausea And Vomiting    Also causes dizziness and states feels like she has an outer body experience.  . Sulfonamide Derivatives Rash    Medications Prior to Admission  Medication Sig Dispense Refill  . albuterol (PROVENTIL HFA;VENTOLIN HFA) 108 (90 Base) MCG/ACT inhaler Inhale 2 puffs into the lungs every 6 (six) hours as needed for wheezing or shortness of breath. 1 Inhaler 3  . amLODipine (NORVASC) 5 MG tablet Take 1 tablet (5 mg total) by mouth daily. (Patient taking differently: Take 5 mg by mouth at bedtime. ) 90 tablet 3  . BREO ELLIPTA 100-25 MCG/INH AEPB TAKE 1 PUFF BY MOUTH EVERY DAY (Patient taking differently: Inhale 1 puff into the lungs daily at 12 noon. ) 60 each 11  . Calcium Carbonate-Vit D-Min (CALCIUM 1200) 1200-1000 MG-UNIT CHEW Chew 2 tablets by mouth daily.     . Fluticasone Propionate (XHANCE) 93 MCG/ACT EXHU Place 2 puffs into the nose 2 (two) times daily. 32 mL 11  . ibuprofen (ADVIL,MOTRIN) 800 MG tablet Take 1 tablet (800 mg total) by mouth every 8 (eight) hours as needed. (Patient taking differently: Take 800 mg by mouth every 8 (eight) hours as needed for moderate pain. ) 30 tablet 1  . montelukast (SINGULAIR) 10 MG tablet Take 1 tablet (10 mg total) by mouth at  bedtime. (Patient taking differently: Take 10 mg by mouth daily as needed (allergies). ) 30 tablet 3  . Olopatadine HCl (PAZEO) 0.7 % SOLN Place 1 drop into both eyes 2 (two) times a week. 1 Bottle 5  . Polyvinyl Alcohol-Povidone (REFRESH OP) Place 1 drop into both eyes 2 (two) times daily.    . rosuvastatin (CRESTOR) 5 MG tablet Take 1 tablet (5 mg total) by mouth daily. (Patient taking differently: Take 5 mg by mouth at bedtime. ) 90 tablet 3  . Azelastine-Fluticasone 137-50 MCG/ACT SUSP Place 2 sprays 2 (two) times daily into the nose. (Patient not taking: Reported on 04/05/2018) 23 g 5  . dexlansoprazole (DEXILANT) 60 MG capsule Take 1 capsule (60 mg total) by mouth daily before breakfast. (Patient not taking:  Reported on 04/05/2018) 30 capsule 5  . gabapentin (NEURONTIN) 100 MG capsule One capsule at bedtime as needed (Patient taking differently: Take 100 mg by mouth 2 (two) times daily as needed (sciatica pain). ) 90 capsule 3    No results found for this or any previous visit (from the past 48 hour(s)). No results found.  ROS  Blood pressure 137/80, pulse 70, temperature 97.6 F (36.4 C), temperature source Oral, resp. rate 18, height 5' 5.5" (1.664 m), weight 69.4 kg, SpO2 100 %. Physical Exam  Constitutional: She appears well-developed and well-nourished.  HENT:  Mouth/Throat: Oropharynx is clear and moist.  Eyes: Conjunctivae are normal. No scleral icterus.  Neck: No thyromegaly present.  Cardiovascular: Normal rate, regular rhythm and normal heart sounds.  No murmur heard. Respiratory: Effort normal and breath sounds normal.  GI: Soft. She exhibits no distension. There is no abdominal tenderness.  Musculoskeletal:        General: No edema.  Lymphadenopathy:    She has no cervical adenopathy.  Neurological: She is alert.  Skin: Skin is warm and dry.     Assessment/Plan Average rescreening colonoscopy.  Hildred Laser, MD 04/11/2018, 8:09 AM

## 2018-04-11 NOTE — Op Note (Signed)
[facility] Patient Name: Denise Werner Procedure Date: 04/11/2018 7:20 AM MRN: [id number] Date of Birth: [birth year] Attending MD: Hildred Laser , MD CSN: [id number] Age: 63 Admit Type: Outpatient Procedure:                Colonoscopy Indications:              Screening for colorectal malignant neoplasm Providers:                Hildred Laser, MD, Janeece Riggers, RN, Nelma Rothman,                            Technician Referring MD:             Norwood Levo. Moshe Cipro, MD Medicines:                Meperidine 50 mg IV, Midazolam 4 mg IV Complications:            No immediate complications. Estimated Blood Loss:     Estimated blood loss was minimal. Procedure:                Pre-Anesthesia Assessment:                           - Prior to the procedure, a History and Physical                            was performed, and patient medications and                            allergies were reviewed. The patient's tolerance of                            previous anesthesia was also reviewed. The risks                            and benefits of the procedure and the sedation                            options and risks were discussed with the patient.                            All questions were answered, and informed consent                            was obtained. Prior Anticoagulants: The patient has                            taken no previous anticoagulant or antiplatelet                            agents. ASA Grade Assessment: II - A patient with                            mild systemic disease. After reviewing the risks  and benefits, the patient was deemed in                            satisfactory condition to undergo the procedure.                           After obtaining informed consent, the colonoscope                            was passed under direct vision. Throughout the                            procedure, the patient's blood pressure, pulse, and                          oxygen saturations were monitored continuously. The                            [id number]-H190DL ([id number]) scope was introduced through                            the anus and advanced to the the cecum, identified                            by appendiceal orifice and ileocecal valve. The                            colonoscopy was performed without difficulty. The                            patient tolerated the procedure well. The quality                            of the bowel preparation was excellent. The                            ileocecal valve, appendiceal orifice, and rectum                            were photographed. Scope In: 8:18:21 AM Scope Out: 8:32:41 AM Scope Withdrawal Time: 0 hours 8 minutes 1 second  Total Procedure Duration: 0 hours 14 minutes 20 seconds  Findings:      The perianal and digital rectal examinations were normal.      A single small-mouthed diverticulum was found in the hepatic flexure.      A small polyp was found in the splenic flexure. The polyp was sessile.       Biopsies were taken with a cold forceps for histology.      A few small-mouthed diverticula were found in the sigmoid colon.      External hemorrhoids were found during retroflexion. The hemorrhoids       were small.      Anal papilla(e) were hypertrophied. Impression:               - Diverticulosis at the hepatic flexure.                           -  One small polyp at the splenic flexure. Biopsied.                           - Diverticulosis in the sigmoid colon.                           - External hemorrhoids.                           - Anal papilla(e) were hypertrophied. Moderate Sedation:      Moderate (conscious) sedation was administered by the endoscopy nurse       and supervised by the endoscopist. The following parameters were       monitored: oxygen saturation, heart rate, blood pressure, CO2       capnography and response to care. Total physician intraservice  time was       20 minutes. Recommendation:           - Patient has a contact number available for                            emergencies. The signs and symptoms of potential                            delayed complications were discussed with the                            patient. Return to normal activities tomorrow.                            Written discharge instructions were provided to the                            patient.                           - High fiber diet today.                           - Continue present medications.                           - No aspirin, ibuprofen, naproxen, or other                            non-steroidal anti-inflammatory drugs for 1 day.                           - Await pathology results.                           - Repeat colonoscopy is recommended. The                            colonoscopy date will be determined after pathology  results from today's exam become available for                            review. Procedure Code(s):        --- Professional ---                           [phone], Colonoscopy, flexible; with biopsy, single                            or multiple                           G0500, Moderate sedation services provided by the                            same physician or other qualified health care                            professional performing a gastrointestinal                            endoscopic service that sedation supports,                            requiring the presence of an independent trained                            observer to assist in the monitoring of the                            patient's level of consciousness and physiological                            status; initial 15 minutes of intra-service time;                            patient age 51 years or older (additional time may                            be reported with [phone], as appropriate) Diagnosis Code(s):         --- Professional ---                           Z12.11, Encounter for screening for malignant                            neoplasm of colon                           K64.4, Residual hemorrhoidal skin tags                           D12.3, Benign neoplasm of transverse colon (hepatic  flexure or splenic flexure)                           K62.89, Other specified diseases of anus and rectum                           K57.30, Diverticulosis of large intestine without                            perforation or abscess without bleeding CPT copyright 2018 [employer]. All rights reserved. The codes documented in this report are preliminary and upon coder review may  be revised to meet current compliance requirements. Hildred Laser, MD Hildred Laser, MD 04/11/2018 8:55:40 AM This report has been signed electronically. Number of Addenda: 0

## 2018-04-11 NOTE — Discharge Instructions (Signed)
No aspirin or NSAIDs for 24 hours. Resume usual medications as before. High-fiber diet. No driving for 24 hours. Physician will call with biopsy results.   Colonoscopy, Adult, Care After This sheet gives you information about how to care for yourself after your procedure. Your doctor may also give you more specific instructions. If you have problems or questions, call your doctor. What can I expect after the procedure? After the procedure, it is common to have:  A small amount of blood in your poop for 24 hours.  Some gas.  Mild cramping or bloating in your belly. Follow these instructions at home: General instructions  For the first 24 hours after the procedure: ? Do not drive or use machinery. ? Do not sign important documents. ? Do not drink alcohol. ? Do your daily activities more slowly than normal. ? Eat foods that are soft and easy to digest.  Take over-the-counter or prescription medicines only as told by your doctor. To help cramping and bloating:   Try walking around.  Put heat on your belly (abdomen) as told by your doctor. Use a heat source that your doctor recommends, such as a moist heat pack or a heating pad. ? Put a towel between your skin and the heat source. ? Leave the heat on for 20-30 minutes. ? Remove the heat if your skin turns bright red. This is especially important if you cannot feel pain, heat, or cold. You can get burned. Eating and drinking   Drink enough fluid to keep your pee (urine) clear or pale yellow.  Return to your normal diet as told by your doctor. Avoid heavy or fried foods that are hard to digest.  Avoid drinking alcohol for as long as told by your doctor. Contact a doctor if:  You have blood in your poop (stool) 2-3 days after the procedure. Get help right away if:  You have more than a small amount of blood in your poop.  You see large clumps of tissue (blood clots) in your poop.  Your belly is swollen.  You feel sick  to your stomach (nauseous).  You throw up (vomit).  You have a fever.  You have belly pain that gets worse, and medicine does not help your pain. Summary  After the procedure, it is common to have a small amount of blood in your poop. You may also have mild cramping and bloating in your belly.  For the first 24 hours after the procedure, do not drive or use machinery, do not sign important documents, and do not drink alcohol.  Get help right away if you have a lot of blood in your poop, feel sick to your stomach, have a fever, or have more belly pain. This information is not intended to replace advice given to you by your health care provider. Make sure you discuss any questions you have with your health care provider. Document Released: 02/19/2010 Document Revised: 11/17/2016 Document Reviewed: 10/12/2015 Elsevier Interactive Patient Education  2019 [employer].  Hemorrhoids Hemorrhoids are swollen veins in and around the rectum or anus. There are two types of hemorrhoids:  Internal hemorrhoids. These occur in the veins that are just inside the rectum. They may poke through to the outside and become irritated and painful.  External hemorrhoids. These occur in the veins that are outside the anus and can be felt as a painful swelling or hard lump near the anus. Most hemorrhoids do not cause serious problems, and they can be managed with  home treatments such as diet and lifestyle changes. If home treatments do not help the symptoms, procedures can be done to shrink or remove the hemorrhoids. What are the causes? This condition is caused by increased pressure in the anal area. This pressure may result from various things, including:  Constipation.  Straining to have a bowel movement.  Diarrhea.  Pregnancy.  Obesity.  Sitting for long periods of time.  Heavy lifting or other activity that causes you to strain.  Anal sex.  Riding a bike for a long period of time. What are  the signs or symptoms? Symptoms of this condition include:  Pain.  Anal itching or irritation.  Rectal bleeding.  Leakage of stool (feces).  Anal swelling.  One or more lumps around the anus. How is this diagnosed? This condition can often be diagnosed through a visual exam. Other exams or tests may also be done, such as:  An exam that involves feeling the rectal area with a gloved hand (digital rectal exam).  An exam of the anal canal that is done using a small tube (anoscope).  A blood test, if you have lost a significant amount of blood.  A test to look inside the colon using a flexible tube with a camera on the end (sigmoidoscopy or colonoscopy). How is this treated? This condition can usually be treated at home. However, various procedures may be done if dietary changes, lifestyle changes, and other home treatments do not help your symptoms. These procedures can help make the hemorrhoids smaller or remove them completely. Some of these procedures involve surgery, and others do not. Common procedures include:  Rubber band ligation. Rubber bands are placed at the base of the hemorrhoids to cut off their blood supply.  Sclerotherapy. Medicine is injected into the hemorrhoids to shrink them.  Infrared coagulation. A type of light energy is used to get rid of the hemorrhoids.  Hemorrhoidectomy surgery. The hemorrhoids are surgically removed, and the veins that supply them are tied off.  Stapled hemorrhoidopexy surgery. The surgeon staples the base of the hemorrhoid to the rectal wall. Follow these instructions at home: Eating and drinking   Eat foods that have a lot of fiber in them, such as whole grains, beans, nuts, fruits, and vegetables.  Ask your health care provider about taking products that have added fiber (fiber supplements).  Reduce the amount of fat in your diet. You can do this by eating low-fat dairy products, eating less red meat, and avoiding processed  foods.  Drink enough fluid to keep your urine pale yellow. Managing pain and swelling   Take warm sitz baths for 20 minutes, 3-4 times a day to ease pain and discomfort. You may do this in a bathtub or using a portable sitz bath that fits over the toilet.  If directed, apply ice to the affected area. Using ice packs between sitz baths may be helpful. ? Put ice in a plastic bag. ? Place a towel between your skin and the bag. ? Leave the ice on for 20 minutes, 2-3 times a day. General instructions  Take over-the-counter and prescription medicines only as told by your health care provider.  Use medicated creams or suppositories as told.  Get regular exercise. Ask your health care provider how much and what kind of exercise is best for you. In general, you should do moderate exercise for at least 30 minutes on most days of the week (150 minutes each week). This can include activities such as walking,  biking, or yoga.  Go to the bathroom when you have the urge to have a bowel movement. Do not wait.  Avoid straining to have bowel movements.  Keep the anal area dry and clean. Use wet toilet paper or moist towelettes after a bowel movement.  Do not sit on the toilet for long periods of time. This increases blood pooling and pain.  Keep all follow-up visits as told by your health care provider. This is important. Contact a health care provider if you have:  Increasing pain and swelling that are not controlled by treatment or medicine.  Difficulty having a bowel movement, or you are unable to have a bowel movement.  Pain or inflammation outside the area of the hemorrhoids. Get help right away if you have:  Uncontrolled bleeding from your rectum. Summary  Hemorrhoids are swollen veins in and around the rectum or anus.  Most hemorrhoids can be managed with home treatments such as diet and lifestyle changes.  Taking warm sitz baths can help ease pain and discomfort.  In severe  cases, procedures or surgery can be done to shrink or remove the hemorrhoids. This information is not intended to replace advice given to you by your health care provider. Make sure you discuss any questions you have with your health care provider. Document Released: 01/15/2000 Document Revised: 06/08/2017 Document Reviewed: 06/08/2017 Elsevier Interactive Patient Education  2019 [employer].  Diverticulosis  Diverticulosis is a condition that develops when small pouches (diverticula) form in the wall of the large intestine (colon). The colon is where water is absorbed and stool is formed. The pouches form when the inside layer of the colon pushes through weak spots in the outer layers of the colon. You may have a few pouches or many of them. What are the causes? The cause of this condition is not known. What increases the risk? The following factors may make you more likely to develop this condition:  Being older than age 53. Your risk for this condition increases with age. Diverticulosis is rare among people younger than age 66. By age 110, many people have it.  Eating a low-fiber diet.  Having frequent constipation.  Being overweight.  Not getting enough exercise.  Smoking.  Taking over-the-counter pain medicines, like aspirin and ibuprofen.  Having a family history of diverticulosis. What are the signs or symptoms? In most people, there are no symptoms of this condition. If you do have symptoms, they may include:  Bloating.  Cramps in the abdomen.  Constipation or diarrhea.  Pain in the lower left side of the abdomen. How is this diagnosed? This condition is most often diagnosed during an exam for other colon problems. Because diverticulosis usually has no symptoms, it often cannot be diagnosed independently. This condition may be diagnosed by:  Using a flexible scope to examine the colon (colonoscopy).  Taking an X-ray of the colon after dye has been put into the  colon (barium enema).  Doing a CT scan. How is this treated? You may not need treatment for this condition if you have never developed an infection related to diverticulosis. If you have had an infection before, treatment may include:  Eating a high-fiber diet. This may include eating more fruits, vegetables, and grains.  Taking a fiber supplement.  Taking a live bacteria supplement (probiotic).  Taking medicine to relax your colon.  Taking antibiotic medicines. Follow these instructions at home:  Drink 6-8 glasses of water or more each day to prevent constipation.  Try  not to strain when you have a bowel movement.  If you have had an infection before: ? Eat more fiber as directed by your health care provider or your diet and nutrition specialist (dietitian). ? Take a fiber supplement or probiotic, if your health care provider approves.  Take over-the-counter and prescription medicines only as told by your health care provider.  If you were prescribed an antibiotic, take it as told by your health care provider. Do not stop taking the antibiotic even if you start to feel better.  Keep all follow-up visits as told by your health care provider. This is important. Contact a health care provider if:  You have pain in your abdomen.  You have bloating.  You have cramps.  You have not had a bowel movement in 3 days. Get help right away if:  Your pain gets worse.  Your bloating becomes very bad.  You have a fever or chills, and your symptoms suddenly get worse.  You vomit.  You have bowel movements that are bloody or black.  You have bleeding from your rectum. Summary  Diverticulosis is a condition that develops when small pouches (diverticula) form in the wall of the large intestine (colon).  You may have a few pouches or many of them.  This condition is most often diagnosed during an exam for other colon problems.  If you have had an infection related to  diverticulosis, treatment may include increasing the fiber in your diet, taking supplements, or taking medicines. This information is not intended to replace advice given to you by your health care provider. Make sure you discuss any questions you have with your health care provider. Document Released: 10/15/2003 Document Revised: 12/07/2015 Document Reviewed: 12/07/2015 Elsevier Interactive Patient Education  2019 [employer].  Colon Polyps  Polyps are tissue growths inside the body. Polyps can grow in many places, including the large intestine (colon). A polyp may be a round bump or a mushroom-shaped growth. You could have one polyp or several. Most colon polyps are noncancerous (benign). However, some colon polyps can become cancerous over time. Finding and removing the polyps early can help prevent this. What are the causes? The exact cause of colon polyps is not known. What increases the risk? You are more likely to develop this condition if you:  Have a family history of colon cancer or colon polyps.  Are older than 53 or older than 45 if you are African American.  Have inflammatory bowel disease, such as ulcerative colitis or Crohn's disease.  Have certain hereditary conditions, such as: ? Familial adenomatous polyposis. ? Lynch syndrome. ? Turcot syndrome. ? Peutz-Jeghers syndrome.  Are overweight.  Smoke cigarettes.  Do not get enough exercise.  Drink too much alcohol.  Eat a diet that is high in fat and red meat and low in fiber.  Had childhood cancer that was treated with abdominal radiation. What are the signs or symptoms? Most polyps do not cause symptoms. If you have symptoms, they may include:  Blood coming from your rectum when having a bowel movement.  Blood in your stool. The stool may look dark red or black.  Abdominal pain.  A change in bowel habits, such as constipation or diarrhea. How is this diagnosed? This condition is diagnosed with a  colonoscopy. This is a procedure in which a lighted, flexible scope is inserted into the anus and then passed into the colon to examine the area. Polyps are sometimes found when a colonoscopy is done as  part of routine cancer screening tests. How is this treated? Treatment for this condition involves removing any polyps that are found. Most polyps can be removed during a colonoscopy. Those polyps will then be tested for cancer. Additional treatment may be needed depending on the results of testing. Follow these instructions at home: Lifestyle  Maintain a healthy weight, or lose weight if recommended by your health care provider.  Exercise every day or as told by your health care provider.  Do not use any products that contain nicotine or tobacco, such as cigarettes and e-cigarettes. If you need help quitting, ask your health care provider.  If you drink alcohol, limit how much you have: ? 0-1 drink a day for women. ? 0-2 drinks a day for men.  Be aware of how much alcohol is in your drink. In the [place] one drink equals one 12 oz bottle of beer (355 mL), one 5 oz glass of wine (148 mL), or one 1 oz shot of hard liquor (44 mL). Eating and drinking   Eat foods that are high in fiber, such as fruits, vegetables, and whole grains.  Eat foods that are high in calcium and vitamin D, such as milk, cheese, yogurt, eggs, liver, fish, and broccoli.  Limit foods that are high in fat, such as fried foods and desserts.  Limit the amount of red meat and processed meat you eat, such as hot dogs, sausage, bacon, and lunch meats. General instructions  Keep all follow-up visits as told by your health care provider. This is important. ? This includes having regularly scheduled colonoscopies. ? Talk to your health care provider about when you need a colonoscopy. Contact a health care provider if:  You have new or worsening bleeding during a bowel movement.  You have new or increased blood in your  stool.  You have a change in bowel habits.  You lose weight for no known reason. Summary  Polyps are tissue growths inside the body. Polyps can grow in many places, including the colon.  Most colon polyps are noncancerous (benign), but some can become cancerous over time.  This condition is diagnosed with a colonoscopy.  Treatment for this condition involves removing any polyps that are found. Most polyps can be removed during a colonoscopy. This information is not intended to replace advice given to you by your health care provider. Make sure you discuss any questions you have with your health care provider. Document Released: 10/14/2003 Document Revised: 05/04/2017 Document Reviewed: 05/04/2017 Elsevier Interactive Patient Education  2019 [employer].

## 2018-04-14 ENCOUNTER — Encounter: Payer: Self-pay | Admitting: Family Medicine

## 2018-04-16 ENCOUNTER — Encounter: Payer: Self-pay | Admitting: Family Medicine

## 2018-04-16 ENCOUNTER — Ambulatory Visit (INDEPENDENT_AMBULATORY_CARE_PROVIDER_SITE_OTHER): Payer: BC Managed Care – PPO | Admitting: Family Medicine

## 2018-04-16 ENCOUNTER — Other Ambulatory Visit: Payer: Self-pay

## 2018-04-16 ENCOUNTER — Ambulatory Visit (HOSPITAL_COMMUNITY)
Admission: RE | Admit: 2018-04-16 | Discharge: 2018-04-16 | Disposition: A | Discharge status: Home / Self Care | Payer: BC Managed Care – PPO | Source: Ambulatory Visit | Attending: Family Medicine | Admitting: Family Medicine

## 2018-04-16 VITALS — BP 124/80 | HR 87 | Temp 98.8°F | Resp 15 | Ht 65.0 in | Wt 150.0 lb

## 2018-04-16 DIAGNOSIS — R7303 Prediabetes: Secondary | ICD-10-CM | POA: Diagnosis not present

## 2018-04-16 DIAGNOSIS — J209 Acute bronchitis, unspecified: Secondary | ICD-10-CM

## 2018-04-16 DIAGNOSIS — I10 Essential (primary) hypertension: Secondary | ICD-10-CM | POA: Diagnosis not present

## 2018-04-16 DIAGNOSIS — E785 Hyperlipidemia, unspecified: Secondary | ICD-10-CM | POA: Diagnosis not present

## 2018-04-16 DIAGNOSIS — Z9109 Other allergy status, other than to drugs and biological substances: Secondary | ICD-10-CM

## 2018-04-16 MED ORDER — PENICILLIN V POTASSIUM 500 MG PO TABS: 500.0000 mg | ORAL_TABLET | Freq: Three times a day (TID) | ORAL | 0 refills | Status: DC

## 2018-04-16 MED ORDER — BENZONATATE 100 MG PO CAPS: 100.0000 mg | ORAL_CAPSULE | Freq: Two times a day (BID) | ORAL | 0 refills | Status: DC | PRN

## 2018-04-16 MED ORDER — PROMETHAZINE-DM 6.25-15 MG/5ML PO SYRP: ORAL_SOLUTION | ORAL | 0 refills | Status: DC

## 2018-04-16 NOTE — Patient Instructions (Signed)
F/U in April, call if you need me  Before  Please get CXR today, and 3 medications are at your pharmacy  Rest, fluids and good hygiene    Fasting lipid, cmp and EGFr and hBa1C 5 days before April visit ( [place])

## 2018-04-22 ENCOUNTER — Encounter: Payer: Self-pay | Admitting: Family Medicine

## 2018-04-22 NOTE — Assessment & Plan Note (Signed)
Hyperlipidemia:Low fat diet discussed and encouraged.   Lipid Panel  Lab Results  Component Value Date   CHOL 154 12/18/2017   HDL 35 (L) 12/18/2017   LDLCALC 102 (H) 12/18/2017   TRIG 84 12/18/2017   CHOLHDL 4.4 12/18/2017  Updated lab needed at/ before next visit.

## 2018-04-22 NOTE — Assessment & Plan Note (Signed)
Symptomatic with report of fever CXR  On day of visit. Penicillin, decongestant and cough suppresant

## 2018-04-22 NOTE — Assessment & Plan Note (Signed)
managed with immunotherapy

## 2018-04-22 NOTE — Progress Notes (Signed)
Denise Werner     MRN: [id number]      DOB: 11/30/1955   HPI Denise Werner is here  4 day h/o  h/o worsening head and chest congestion, associated with fever  intermittently. Sputum is at times thick and yellow. denies hearing loss and sore throat.  No improvement with OTC medication.  ROS  Denies chest pains, palpitations and leg swelling Denies abdominal pain, nausea, vomiting,diarrhea or constipation.   Denies dysuria, frequency, hesitancy or incontinence. Denies joint pain, swelling and limitation in mobility. Denies headaches, seizures, numbness, or tingling. Denies depression, anxiety or insomnia. Denies skin break down or rash.   PE  BP 124/80   Pulse 87   Temp 98.8 F (37.1 C)   Resp 15   Ht 5\' 5"  (1.651 m)   Wt 150 lb (68 kg)   SpO2 100%   BMI 24.96 kg/m   Patient alert and oriented and in no cardiopulmonary distress.  HEENT: No facial asymmetry, EOMI,   oropharynx pink and moist.  Neck supple no JVD, no mass.  Chest: Clear with adequate air entry bilaterally, no wheezes or crackles  CVS: S1, S2 no murmurs, no S3.Regular rate.  ABD: Soft non tender.   Ext: No edema  MS: Adequate ROM spine, shoulders, hips and knees.  Skin: Intact, no ulcerations or rash noted.  Psych: Good eye contact, normal affect. Memory intact not anxious or depressed appearing.  CNS: CN 2-12 intact, power,  normal throughout.no focal deficits noted.   Assessment & Plan  Acute bronchitis Symptomatic with report of fever CXR  On day of visit. Penicillin, decongestant and cough suppresant  HTN (hypertension) Controlled, no change in medication DASH diet and commitment to daily physical activity for a minimum of 30 minutes discussed and encouraged, as a part of hypertension management. The importance of attaining a healthy weight is also discussed.  BP/Weight 04/16/2018 04/11/2018 03/07/2018 12/18/2017 10/31/2017 09/06/2017 2/35/3614  Systolic BP 431 95 540 086 108 761 950   Diastolic BP 80 60 82 70 68 78 60  Wt. (Lbs) 150 153 - 153.12 152 154 152.08  BMI 24.96 25.07 - 25.48 26.09 24.86 24.55       Hyperlipidemia LDL goal <100 Hyperlipidemia:Low fat diet discussed and encouraged.   Lipid Panel  Lab Results  Component Value Date   CHOL 154 12/18/2017   HDL 35 (L) 12/18/2017   LDLCALC 102 (H) 12/18/2017   TRIG 84 12/18/2017   CHOLHDL 4.4 12/18/2017  Updated lab needed at/ before next visit.      Prediabetes Patient educated about the importance of limiting  Carbohydrate intake , the need to commit to daily physical activity for a minimum of 30 minutes , and to commit weight loss. The fact that changes in all these areas will reduce or eliminate all together the development of diabetes is stressed.   Diabetic Labs Latest Ref Rng & Units 12/18/2017 05/23/2017 05/09/2017 12/10/2016 06/22/2016  HbA1c <5.7 % of total Hgb 6.2(H) 5.9(H) - 5.8(H) 5.9(H)  Chol <200 mg/dL 154 138 - 168 192  HDL >50 mg/dL 35(L) 41(L) - 59 52  Calc LDL mg/dL (calc) 102(H) 85 - 95 130(H)  Triglycerides <150 mg/dL 84 50 - 46 50  Creatinine 0.50 - 0.99 mg/dL 0.80 0.72 0.72 0.70 0.85   BP/Weight 04/16/2018 04/11/2018 03/07/2018 12/18/2017 10/31/2017 09/06/2017 9/32/6712  Systolic BP 458 95 099 833 825 053 976  Diastolic BP 80 60 82 70 68 78 60  Wt. (Lbs) 150 153 -  153.12 152 154 152.08  BMI 24.96 25.07 - 25.48 26.09 24.86 24.55   No flowsheet data found.  Updated lab needed at/ before next visit.

## 2018-04-22 NOTE — Assessment & Plan Note (Signed)
Controlled, no change in medication DASH diet and commitment to daily physical activity for a minimum of 30 minutes discussed and encouraged, as a part of hypertension management. The importance of attaining a healthy weight is also discussed.  BP/Weight 04/16/2018 04/11/2018 03/07/2018 12/18/2017 10/31/2017 09/06/2017 9/53/6922  Systolic BP 300 95 979 499 718 209 906  Diastolic BP 80 60 82 70 68 78 60  Wt. (Lbs) 150 153 - 153.12 152 154 152.08  BMI 24.96 25.07 - 25.48 26.09 24.86 24.55

## 2018-04-22 NOTE — Assessment & Plan Note (Signed)
Patient educated about the importance of limiting  Carbohydrate intake , the need to commit to daily physical activity for a minimum of 30 minutes , and to commit weight loss. The fact that changes in all these areas will reduce or eliminate all together the development of diabetes is stressed.   Diabetic Labs Latest Ref Rng & Units 12/18/2017 05/23/2017 05/09/2017 12/10/2016 06/22/2016  HbA1c <5.7 % of total Hgb 6.2(H) 5.9(H) - 5.8(H) 5.9(H)  Chol <200 mg/dL 154 138 - 168 192  HDL >50 mg/dL 35(L) 41(L) - 59 52  Calc LDL mg/dL (calc) 102(H) 85 - 95 130(H)  Triglycerides <150 mg/dL 84 50 - 46 50  Creatinine 0.50 - 0.99 mg/dL 0.80 0.72 0.72 0.70 0.85   BP/Weight 04/16/2018 04/11/2018 03/07/2018 12/18/2017 10/31/2017 09/06/2017 7/41/2878  Systolic BP 676 95 720 947 096 283 662  Diastolic BP 80 60 82 70 68 78 60  Wt. (Lbs) 150 153 - 153.12 152 154 152.08  BMI 24.96 25.07 - 25.48 26.09 24.86 24.55   No flowsheet data found.  Updated lab needed at/ before next visit.

## 2018-05-02 ENCOUNTER — Encounter: Payer: Self-pay | Admitting: Allergy & Immunology

## 2018-05-04 ENCOUNTER — Telehealth: Payer: Self-pay | Admitting: Allergy & Immunology

## 2018-05-04 NOTE — Telephone Encounter (Signed)
COVID letter written for patient's work.  [name], MD [facility] [place]

## 2018-05-07 ENCOUNTER — Encounter (HOSPITAL_COMMUNITY): Admission: RE | Admit: 2018-05-07 | Payer: BC Managed Care – PPO | Source: Ambulatory Visit

## 2018-05-07 ENCOUNTER — Telehealth: Payer: Self-pay | Admitting: *Deleted

## 2018-05-07 NOTE — Telephone Encounter (Signed)
Charm Barges with [facility] and stated that Ms. Sullenger was due for an infusion today at [facility] but the order was expired. If Dr. Moshe Cipro still wanted her to have the infusion it would need a new order.

## 2018-05-07 NOTE — Telephone Encounter (Signed)
AP needs a new order for reclast. The forms for that are at the office. Don't know if there is a way around that or if they will take a verbal from the provider. Let me know what you need me to do

## 2018-05-07 NOTE — Telephone Encounter (Signed)
I called the hospital, Reclast on hold now and pt is aware , fyi

## 2018-05-21 ENCOUNTER — Ambulatory Visit: Payer: BC Managed Care – PPO | Admitting: Family Medicine

## 2018-05-22 ENCOUNTER — Other Ambulatory Visit: Payer: Self-pay | Admitting: Family Medicine

## 2018-05-22 ENCOUNTER — Encounter: Payer: Self-pay | Admitting: Family Medicine

## 2018-05-23 ENCOUNTER — Ambulatory Visit (INDEPENDENT_AMBULATORY_CARE_PROVIDER_SITE_OTHER): Payer: BC Managed Care – PPO

## 2018-05-23 DIAGNOSIS — J309 Allergic rhinitis, unspecified: Secondary | ICD-10-CM | POA: Diagnosis not present

## 2018-05-28 ENCOUNTER — Ambulatory Visit (INDEPENDENT_AMBULATORY_CARE_PROVIDER_SITE_OTHER): Payer: BC Managed Care – PPO | Admitting: Family Medicine

## 2018-05-28 ENCOUNTER — Encounter: Payer: Self-pay | Admitting: Family Medicine

## 2018-05-28 ENCOUNTER — Other Ambulatory Visit: Payer: Self-pay

## 2018-05-28 VITALS — BP 124/80 | Ht 65.0 in | Wt 150.0 lb

## 2018-05-28 DIAGNOSIS — E785 Hyperlipidemia, unspecified: Secondary | ICD-10-CM

## 2018-05-28 DIAGNOSIS — J302 Other seasonal allergic rhinitis: Secondary | ICD-10-CM

## 2018-05-28 DIAGNOSIS — K219 Gastro-esophageal reflux disease without esophagitis: Secondary | ICD-10-CM

## 2018-05-28 DIAGNOSIS — I10 Essential (primary) hypertension: Secondary | ICD-10-CM | POA: Diagnosis not present

## 2018-05-28 DIAGNOSIS — J3089 Other allergic rhinitis: Secondary | ICD-10-CM | POA: Diagnosis not present

## 2018-05-28 DIAGNOSIS — E559 Vitamin D deficiency, unspecified: Secondary | ICD-10-CM

## 2018-05-28 DIAGNOSIS — R7303 Prediabetes: Secondary | ICD-10-CM

## 2018-05-28 NOTE — Progress Notes (Signed)
Virtual Visit via Telephone Note  I connected with Denise Werner on 05/28/18 at  3:40 PM EDT by telephone and verified that [name] using two identifiers.   I discussed the limitations, risks, security and privacy concerns of performing an evaluation and management service by telephone and the availability of in person appointments. I also discussed with the patient that there may be a patient responsible charge related to this service. The patient expressed understanding and agreed to proceed. Patient is in her home and I am in the office Webex used for visit    History of Present Illness: F/U chronic problems and review recent labs Denies recent fever or chills. Denies sinus pressure, nasal congestion, ear pain or sore throat. C/o chronic soreness and heaviness behind left eye since past approx 8 months, reports reduction in redness and drainage Denies chest congestion, productive cough or wheezing. Denies chest pains, palpitations and leg swelling Chronic nausea , with uncontrolled GERD, wants to try alternate medication, denies  vomiting,diarrhea or constipation.   Denies dysuria, frequency, hesitancy or incontinence. Recent increase in back and buttock pain, responds to gabapentin, also increased hip apin, but denies instability Denies headaches, seizures, numbness, or tingling. Denies depression, anxiety or insomnia. Denies skin break down or rash. Walking 3 miles/ day and has changed diet, denies insomnia or excessive fatigue, overall  "feels better" despite current stress of COVID 19       Observations/Objective: BP 124/80   Ht 5\' 5"  (1.651 m)   Wt 150 lb (68 kg)   SpO2 100%   BMI 24.96 kg/m  Good communication with no confusion and intact memory. Alert and oriented x 3 No signs of respiratory distress during sppech    Assessment and Plan: HTN (hypertension) Controlled, no change in medication DASH diet and commitment to daily  physical activity for a minimum of 30 minutes discussed and encouraged, as a part of hypertension management. The importance of attaining a healthy weight is also discussed.  BP/Weight 05/28/2018 04/16/2018 04/11/2018 03/07/2018 12/18/2017 99/04/7167 07/07/8936  Systolic BP 101 751 95 025 852 778 242  Diastolic BP 80 80 60 82 70 68 78  Wt. (Lbs) 150 150 153 - 153.12 152 154  BMI 24.96 24.96 25.07 - 25.48 26.09 24.86       Hyperlipidemia LDL goal <100 Hyperlipidemia:Low fat diet discussed and encouraged.   Lipid Panel  Lab Results  Component Value Date   CHOL 163 05/28/2018   HDL 53 05/28/2018   LDLCALC 96 05/28/2018   TRIG 59 05/28/2018   CHOLHDL 3.1 05/28/2018   Excellent and at goal, no change in medication    Prediabetes Improved and nearly normal, pt applauded on this and encouraged to continue same  Lifestyle change  Patient educated about the importance of limiting  Carbohydrate intake , the need to commit to daily physical activity for a minimum of 30 minutes , and to commit weight loss. The fact that changes in all these areas will reduce or eliminate all together the development of diabetes is stressed.   Diabetic Labs Latest Ref Rng & Units 05/28/2018 12/18/2017 05/23/2017 05/09/2017 12/10/2016  HbA1c <5.7 % of total Hgb 5.8(H) 6.2(H) 5.9(H) - 5.8(H)  Chol <200 mg/dL 163 154 138 - 168  HDL > OR = 50 mg/dL 53 35(L) 41(L) - 59  Calc LDL mg/dL (calc) 96 102(H) 85 - 95  Triglycerides <150 mg/dL 59 84 50 - 46  Creatinine 0.50 - 0.99 mg/dL 0.73  0.80 0.72 0.72 0.70   BP/Weight 05/28/2018 04/16/2018 04/11/2018 03/07/2018 12/18/2017 83/01/5174 02/06/735  Systolic BP 106 269 95 485 462 703 500  Diastolic BP 80 80 60 82 70 68 78  Wt. (Lbs) 150 150 153 - 153.12 152 154  BMI 24.96 24.96 25.07 - 25.48 26.09 24.86   No flowsheet data found.    Seasonal and perennial allergic rhinitis Contine immunotherapy, reports good control of symptoms currently  GERD (gastroesophageal reflux  disease) Uncontrolled with report of chrronic nausea, trial of alternate PPI    Follow Up Instructions:    I discussed the assessment and treatment plan with the patient. The patient was provided an opportunity to ask questions and all were answered. The patient agreed with the plan and demonstrated an understanding of the instructions.   The patient was advised to call back or seek an in-person evaluation if the symptoms worsen or if the condition fails to improve as anticipated.  I provided 25  minutes of non-face-to-face time during this encounter.   Tula Nakayama, MD

## 2018-05-28 NOTE — Patient Instructions (Addendum)
Annual Physical exam end Novemebr, call if  You need me sooner  CONGRATS on greatly Improved labs , they will be released to you as soon as we get the hBA1C, keep up the great health habits, they DO pay off!  We will try to get an alternative for your reflux medication due to chronic nausea and uncontrolled GERD symptoms reported  Fasting labs cBC, lipid, cmp and EGFR, hBA1C , tSH and vit D due 11/19 or after, you will need to call in September for the lab order  I recommend you follow through with Ophthalmology evaluation of your left eye since you continue to c/o heaviness and abnormal sensation of the eye  Please send a message if you need Orthopedic referral and evaluation of your hip pain  Your Reclast will be administered at the Specialty clinic when deemed safe to do so, as you know, they will contact you  Social distancing. Frequent hand washing with soap and water Keeping your hands off of your face.wear a face mask when outside of your home These 3 practices will help to keep both you and your community healthy during this time. Please practice them faithfully!  Thanks for choosing [facility], we consider it a privelige to serve you.

## 2018-05-29 ENCOUNTER — Encounter: Payer: Self-pay | Admitting: Family Medicine

## 2018-05-29 LAB — COMPLETE METABOLIC PANEL WITH GFR
AG RATIO: 1.5 (calc) (ref 1.0–2.5)
ALT: 17 U/L (ref 6–29)
AST: 18 U/L (ref 10–35)
Albumin: 4.3 g/dL (ref 3.6–5.1)
Alkaline phosphatase (APISO): 89 U/L (ref 37–153)
BUN: 13 mg/dL (ref 7–25)
CALCIUM: 9.5 mg/dL (ref 8.6–10.4)
CO2: 29 mmol/L (ref 20–32)
CREATININE: 0.73 mg/dL (ref 0.50–0.99)
Chloride: 106 mmol/L (ref 98–110)
GFR, EST AFRICAN AMERICAN: 103 mL/min/{1.73_m2} (ref >=60)
GFR, EST NON AFRICAN AMERICAN: 89 mL/min/{1.73_m2} (ref >=60)
Globulin: 2.9 g/dL (calc) (ref 1.9–3.7)
Glucose, Bld: 97 mg/dL (ref 65–99)
Potassium: 3.9 mmol/L (ref 3.5–5.3)
Sodium: 140 mmol/L (ref 135–146)
TOTAL PROTEIN: 7.2 g/dL (ref 6.1–8.1)
Total Bilirubin: 0.5 mg/dL (ref 0.2–1.2)

## 2018-05-29 LAB — LIPID PANEL
CHOL/HDL RATIO: 3.1 (calc) (ref <5.0)
CHOLESTEROL: 163 mg/dL (ref <200)
HDL: 53 mg/dL (ref >=50)
LDL Cholesterol (Calc): 96 mg/dL (calc)
NON-HDL CHOLESTEROL (CALC): 110 mg/dL (ref <130)
Triglycerides: 59 mg/dL (ref <150)

## 2018-05-29 LAB — HEMOGLOBIN A1C
Hgb A1c MFr Bld: 5.8 % of total Hgb — ABNORMAL HIGH (ref <5.7)
Mean Plasma Glucose: 120 (calc)
eAG (mmol/L): 6.6 (calc)

## 2018-06-01 ENCOUNTER — Ambulatory Visit (INDEPENDENT_AMBULATORY_CARE_PROVIDER_SITE_OTHER): Payer: BC Managed Care – PPO

## 2018-06-01 DIAGNOSIS — J309 Allergic rhinitis, unspecified: Secondary | ICD-10-CM

## 2018-06-02 ENCOUNTER — Encounter: Payer: Self-pay | Admitting: Family Medicine

## 2018-06-02 MED ORDER — PANTOPRAZOLE SODIUM 20 MG PO TBEC: 20.0000 mg | DELAYED_RELEASE_TABLET | Freq: Every day | ORAL | 5 refills | Status: DC

## 2018-06-02 NOTE — Assessment & Plan Note (Addendum)
Improved and nearly normal, pt applauded on this and encouraged to continue same  Lifestyle change  Patient educated about the importance of limiting  Carbohydrate intake , the need to commit to daily physical activity for a minimum of 30 minutes , and to commit weight loss. The fact that changes in all these areas will reduce or eliminate all together the development of diabetes is stressed.   Diabetic Labs Latest Ref Rng & Units 05/28/2018 12/18/2017 05/23/2017 05/09/2017 12/10/2016  HbA1c <5.7 % of total Hgb 5.8(H) 6.2(H) 5.9(H) - 5.8(H)  Chol <200 mg/dL 163 154 138 - 168  HDL > OR = 50 mg/dL 53 35(L) 41(L) - 59  Calc LDL mg/dL (calc) 96 102(H) 85 - 95  Triglycerides <150 mg/dL 59 84 50 - 46  Creatinine 0.50 - 0.99 mg/dL 0.73 0.80 0.72 0.72 0.70   BP/Weight 05/28/2018 04/16/2018 04/11/2018 03/07/2018 12/18/2017 97/03/8204 0/01/5613  Systolic BP 379 432 95 761 470 929 574  Diastolic BP 80 80 60 82 70 68 78  Wt. (Lbs) 150 150 153 - 153.12 152 154  BMI 24.96 24.96 25.07 - 25.48 26.09 24.86   No flowsheet data found.

## 2018-06-02 NOTE — Assessment & Plan Note (Signed)
Controlled, no change in medication DASH diet and commitment to daily physical activity for a minimum of 30 minutes discussed and encouraged, as a part of hypertension management. The importance of attaining a healthy weight is also discussed.  BP/Weight 05/28/2018 04/16/2018 04/11/2018 03/07/2018 12/18/2017 35/06/9739 07/04/8451  Systolic BP 646 803 95 212 248 250 037  Diastolic BP 80 80 60 82 70 68 78  Wt. (Lbs) 150 150 153 - 153.12 152 154  BMI 24.96 24.96 25.07 - 25.48 26.09 24.86

## 2018-06-02 NOTE — Assessment & Plan Note (Signed)
Hyperlipidemia:Low fat diet discussed and encouraged.   Lipid Panel  Lab Results  Component Value Date   CHOL 163 05/28/2018   HDL 53 05/28/2018   LDLCALC 96 05/28/2018   TRIG 59 05/28/2018   CHOLHDL 3.1 05/28/2018   Excellent and at goal, no change in medication

## 2018-06-02 NOTE — Assessment & Plan Note (Signed)
Uncontrolled with report of chrronic nausea, trial of alternate PPI

## 2018-06-02 NOTE — Assessment & Plan Note (Signed)
Contine immunotherapy, reports good control of symptoms currently

## 2018-06-08 ENCOUNTER — Encounter: Payer: Self-pay | Admitting: Allergy & Immunology

## 2018-06-08 ENCOUNTER — Ambulatory Visit (INDEPENDENT_AMBULATORY_CARE_PROVIDER_SITE_OTHER): Payer: BC Managed Care – PPO

## 2018-06-08 DIAGNOSIS — J309 Allergic rhinitis, unspecified: Secondary | ICD-10-CM | POA: Diagnosis not present

## 2018-06-15 ENCOUNTER — Ambulatory Visit: Payer: BC Managed Care – PPO | Admitting: Allergy & Immunology

## 2018-06-15 ENCOUNTER — Other Ambulatory Visit: Payer: Self-pay

## 2018-06-15 ENCOUNTER — Encounter: Payer: Self-pay | Admitting: Allergy & Immunology

## 2018-06-15 VITALS — BP 128/78 | Temp 98.1°F | Resp 18

## 2018-06-15 DIAGNOSIS — J4540 Moderate persistent asthma, uncomplicated: Secondary | ICD-10-CM

## 2018-06-15 DIAGNOSIS — J3089 Other allergic rhinitis: Secondary | ICD-10-CM

## 2018-06-15 DIAGNOSIS — E507 Other ocular manifestations of vitamin A deficiency: Secondary | ICD-10-CM | POA: Diagnosis not present

## 2018-06-15 DIAGNOSIS — J302 Other seasonal allergic rhinitis: Secondary | ICD-10-CM

## 2018-06-15 DIAGNOSIS — R0602 Shortness of breath: Secondary | ICD-10-CM

## 2018-06-15 MED ORDER — CARBINOXAMINE MALEATE ER 4 MG/5ML PO SUER: 4.0000 mg | Freq: Two times a day (BID) | ORAL | 3 refills | Status: DC | PRN

## 2018-06-15 NOTE — Progress Notes (Signed)
FOLLOW UP  Date of Service/Encounter:  06/15/18   Assessment:   Moderate persistent asthma, uncomplicated   Shortness of breath - with a normal d-dimer  Palpitations - with the use of albuterol  Seasonal and perennial allergic rhinitis(trees, weeds, grasses, indoor molds, outdoor molds, dust mites, cat and cockroach)   Bilateral conjunctivitis with xerophthalmia - seeing an ophthalmologist later this month  Plan/Recommendations:   1. Moderate persistent asthma, uncomplicated - We are going to get a D-dimer to rule out a pulmonary embolism. - I doubt that this is the case, but I want to make sure that we are not missing something.  - Albuterol nebulizer machine provided.  - We are going to get Xopenex approved. - Daily controller medication(s): Breo 100/25 one puff once daily - Prior to physical activity: ProAir 2 puffs 10-15 minutes before physical activity. - Rescue medications: ProAir 4 puffs every 4-6 hours as needed or Xopenex one vial every 4-6 hours as needed - Asthma control goals:  * Full participation in all desired activities (may need albuterol before activity) * Albuterol use two time or less a week on average (not counting use with activity) * Cough interfering with sleep two time or less a month * Oral steroids no more than once a year * No hospitalizations  2. Seasonal and perennial allergic rhinitis (trees, weeds, grasses, indoor molds, outdoor molds, dust mites, cat and cockroach) - Continue with allergy shots at the same schedule.  - Continue with Xhance two sprays per nostril up to twice daily. - Continue with Zyrtec 10mg  daily. - Samples of Xyzal provided today as well.  - We will send in a prescription for Karbinal 41mL twice daily as needed to runny nose/allergy symptoms.  - Consider nasal saline rinses 1-2 times daily to remove allergens from the nasal cavities as well as help with mucous clearance (this is especially helpful to do before the nasal  sprays are given)  3. Conjunctivitis - Let us know how the appointment with the ophthalmologist goes. - Continue with Pazeo one drop per eye up to twice daily (this will rule out allergies as a cause of his irritation).   4. Return in about 3 months (around 09/15/2018).   Subjective:   RAJNI HOLSWORTH is a 63 y.o. female presenting today for follow up of  Chief Complaint  Patient presents with  . Asthma    Ronya P Kreher has a history of the following: Patient Active Problem List   Diagnosis Date Noted  . Seasonal and perennial allergic rhinitis 03/08/2018  . 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 GERD (gastroesophageal reflux disease) 12/18/2012  . HTN (hypertension) 09/13/2011  . Hemorrhoid 10/06/2010  . Osteopenia 09/20/2010  . GANGLION CYST 03/17/2010  . Prediabetes 10/08/2009  . Environmental allergies 01/11/2008  . Cough variant asthma 01/11/2008  . Hyperlipidemia LDL goal <100 08/16/2007    History obtained from: chart review and patient.  Mackenzie is a 63 y.o. female presenting for a sick visit.  She was last seen in February 2020.  At that time, her lung testing looked great.  We continued her on Breo 100/25 mcg 1 puff once daily and albuterol as needed.  Her allergic rhinitis, we continued her nasal steroid.  We stopped the Zyrtec and tried Xyzal 5 mg 1-2 times daily.  We also continued with nasal saline rinses and allergen immunotherapy.  She was endorsing conjunctivitis.  We added on Pazeo 1 drop per eye up to twice daily.  She has been having  some problems with shortness of breath and difficulty taking a breath for three weeks. She does well inside of her house, around 90%. She denies chest pain. She does report that she has 20-30 pounds on her chest just sitting there. She has no temperatures or fevers. She denies any blood. She does feel that the albuterol inhaler does help 10%. After that she does nervous and her heart beats very fast.   She did have a CXR around one  month ago or maybe in March. Her lungs were clear. She does not like feeling like this. Her grandkids are coming up to spend the weekend with her and she is worried that she is not going to have the energy. They are spending one night with them and then two nights with her daughter.    She otherwise remains on all of her medications including Breo as well as Xhance and cetirizine. She has not tried using Xyzal yet, but she does think that the [place] samples seem to have worked well. She does use nasal saline rinses during the worst times of the season for her.   Otherwise, there have been no changes to her past medical history, surgical history, family history, or social history.    Review of Systems  Constitutional: Negative.  Negative for chills, fever, malaise/fatigue and weight loss.  HENT: Negative.  Negative for congestion, ear discharge and ear pain.   Eyes: Negative for pain, discharge and redness.  Respiratory: Negative for cough, sputum production, shortness of breath and wheezing.   Cardiovascular: Negative.  Negative for chest pain and palpitations.  Gastrointestinal: Negative for abdominal pain, heartburn, nausea and vomiting.  Skin: Negative.  Negative for itching and rash.  Neurological: Negative for dizziness and headaches.  Endo/Heme/Allergies: Negative for environmental allergies. Does not bruise/bleed easily.       Objective:   Blood pressure 128/78, temperature 98.1 F (36.7 C), temperature source Temporal, resp. rate 18, SpO2 97 %. There is no height or weight on file to calculate BMI.   Physical Exam:  Physical Exam  Constitutional: She appears well-developed.  HENT:  Head: Normocephalic and atraumatic.  Right Ear: Tympanic membrane, external ear and ear canal normal.  Left Ear: Tympanic membrane, external ear and ear canal normal.  Nose: Mucosal edema present. No rhinorrhea, nasal deformity or septal deviation. No epistaxis. Right sinus exhibits no  maxillary sinus tenderness and no frontal sinus tenderness. Left sinus exhibits no maxillary sinus tenderness and no frontal sinus tenderness.  Mouth/Throat: Uvula is midline and oropharynx is clear and moist. Mucous membranes are not pale and not dry.  Eyes: Pupils are equal, round, and reactive to light. Conjunctivae and EOM are normal. Right eye exhibits no chemosis and no discharge. Left eye exhibits no chemosis and no discharge. Right conjunctiva is not injected. Left conjunctiva is not injected.  Cardiovascular: Normal rate, regular rhythm and normal heart sounds.  Respiratory: Effort normal and breath sounds normal. No accessory muscle usage. No tachypnea. No respiratory distress. She has no wheezes. She has no rhonchi. She has no rales. She exhibits no tenderness.  Tachypnea intermittently, otherwise breathing comfortably without the use of accessory muscles of respiration.   Lymphadenopathy:    She has no cervical adenopathy.  Neurological: She is alert.  Skin: No abrasion, no petechiae and no rash noted. Rash is not papular, not vesicular and not urticarial. No erythema. No pallor.  Psychiatric: She has a normal mood and affect.     Diagnostic studies: none  [name], MD  [facility] [facility] [place]

## 2018-06-15 NOTE — Patient Instructions (Addendum)
1. Moderate persistent asthma, uncomplicated - We are going to get a D-dimer to rule out a pulmonary embolism. - I doubt that this is the case, but I want to make sure that we are not missing something.  - Albuterol nebulizer machine provided.  - We are going to get Xopenex approved. - Daily controller medication(s): Breo 100/25 one puff once daily - Prior to physical activity: ProAir 2 puffs 10-15 minutes before physical activity. - Rescue medications: Xopenex nebulizer every 4-6 hours as needed or ProAir 4 puffs every 4-6 hours as needed - Asthma control goals:  * Full participation in all desired activities (may need albuterol before activity) * Albuterol use two time or less a week on average (not counting use with activity) * Cough interfering with sleep two time or less a month * Oral steroids no more than once a year * No hospitalizations  2. Seasonal and perennial allergic rhinitis (trees, weeds, grasses, indoor molds, outdoor molds, dust mites, cat and cockroach) - Continue with allergy shots at the same schedule.  - Continue with Xhance two sprays per nostril up to twice daily. - Continue with Zyrtec 10mg  daily. - Samples of Xyzal provided today as well.  - We will send in a prescription for Karbinal 51mL twice daily as needed to runny nose/allergy symptoms.  - Consider nasal saline rinses 1-2 times daily to remove allergens from the nasal cavities as well as help with mucous clearance (this is especially helpful to do before the nasal sprays are given)  3. Conjunctivitis - Let us know how the appointment with the ophthalmologist goes.  - Continue with Pazeo one drop per eye up to twice daily (this will rule out allergies as a cause of his irritation).   4. Return in about 3 months (around 09/15/2018).   Please inform us of any Emergency Department visits, hospitalizations, or changes in symptoms. Call us before going to the ED for breathing or allergy symptoms since we might be  able to fit you in for a sick visit. Feel free to contact us anytime with any questions, problems, or concerns.  It was a pleasure to see you again today!  Websites that have reliable patient information: 1. American Academy of Asthma, Allergy, and Immunology: www.aaaai.org 2. Food Allergy Research and Education (FARE): foodallergy.org 3. Mothers of Asthmatics: http://www.asthmacommunitynetwork.org 4. American College of Allergy, Asthma, and Immunology: MonthlyElectricBill.co.uk   Make sure you are registered to vote! If you have moved or changed any of your contact information, you will need to get this updated before voting!

## 2018-06-15 NOTE — Progress Notes (Signed)
asthma

## 2018-06-16 ENCOUNTER — Encounter: Payer: Self-pay | Admitting: Allergy & Immunology

## 2018-06-16 LAB — D-DIMER, QUANTITATIVE (NOT AT ARMC): D-DIMER: <0.2 mg/L FEU (ref 0.00–0.49)

## 2018-06-16 MED ORDER — LEVALBUTEROL HCL 1.25 MG/3ML IN NEBU: 1.2500 mg | INHALATION_SOLUTION | RESPIRATORY_TRACT | 3 refills | Status: DC | PRN

## 2018-06-18 ENCOUNTER — Telehealth: Payer: Self-pay | Admitting: *Deleted

## 2018-06-18 NOTE — Telephone Encounter (Signed)
Called and spoke with patient and she stated that she is feeling much better and that the prednisone and nebulizer treatment has been helping tremendously.

## 2018-06-18 NOTE — Telephone Encounter (Signed)
-----   Message from Valentina Shaggy, MD sent at 06/16/2018  8:29 AM EDT ----- Can someone call Ms. [name] to check on her on Monday?

## 2018-06-18 NOTE — Telephone Encounter (Signed)
Thanks for giving her a call!   [name], MD

## 2018-06-20 ENCOUNTER — Ambulatory Visit: Payer: BC Managed Care – PPO | Admitting: Family Medicine

## 2018-06-22 ENCOUNTER — Ambulatory Visit (INDEPENDENT_AMBULATORY_CARE_PROVIDER_SITE_OTHER): Payer: BC Managed Care – PPO

## 2018-06-22 DIAGNOSIS — J309 Allergic rhinitis, unspecified: Secondary | ICD-10-CM

## 2018-06-27 ENCOUNTER — Encounter: Payer: Self-pay | Admitting: Family Medicine

## 2018-06-29 ENCOUNTER — Ambulatory Visit (INDEPENDENT_AMBULATORY_CARE_PROVIDER_SITE_OTHER): Payer: BC Managed Care – PPO

## 2018-06-29 DIAGNOSIS — J309 Allergic rhinitis, unspecified: Secondary | ICD-10-CM | POA: Diagnosis not present

## 2018-07-06 ENCOUNTER — Ambulatory Visit (INDEPENDENT_AMBULATORY_CARE_PROVIDER_SITE_OTHER): Payer: BC Managed Care – PPO

## 2018-07-06 DIAGNOSIS — J309 Allergic rhinitis, unspecified: Secondary | ICD-10-CM

## 2018-08-01 ENCOUNTER — Telehealth: Payer: Self-pay | Admitting: Family Medicine

## 2018-08-01 DIAGNOSIS — I10 Essential (primary) hypertension: Secondary | ICD-10-CM

## 2018-08-01 NOTE — Telephone Encounter (Signed)
pls verify that aPRIL LAB IS INADEQUATE (too olfd) AND IF SO PLS ORDER CHEM 7 AND EgfR,  FORM IS SIGNED AND WITH YOU  RE HER infusion  ?? pls ask

## 2018-08-01 NOTE — Addendum Note (Signed)
Addended by: Eual Fines on: 08/01/2018 04:46 PM   Modules accepted: Orders

## 2018-08-01 NOTE — Telephone Encounter (Signed)
They need bmp within the last 30 days. Called pt to let her know to have lab done for her yearly infusion to be scheduled but no answer and voicemail full

## 2018-08-01 NOTE — Telephone Encounter (Signed)
Sent a message to patient to have lab done for reclast

## 2018-08-10 ENCOUNTER — Other Ambulatory Visit: Payer: Self-pay

## 2018-08-10 ENCOUNTER — Other Ambulatory Visit: Payer: BC Managed Care – PPO

## 2018-08-10 DIAGNOSIS — Z20822 Contact with and (suspected) exposure to covid-19: Secondary | ICD-10-CM

## 2018-08-11 ENCOUNTER — Encounter: Payer: Self-pay | Admitting: Family Medicine

## 2018-08-11 LAB — BASIC METABOLIC PANEL
BUN: 15 mg/dL (ref 7–25)
CO2: 28 mmol/L (ref 20–32)
Calcium: 9.6 mg/dL (ref 8.6–10.4)
Chloride: 106 mmol/L (ref 98–110)
Creat: 0.85 mg/dL (ref 0.50–0.99)
Glucose, Bld: 97 mg/dL (ref 65–99)
Potassium: 4 mmol/L (ref 3.5–5.3)
Sodium: 141 mmol/L (ref 135–146)

## 2018-08-15 LAB — NOVEL CORONAVIRUS, NAA: SARS-CoV-2, NAA: NOT DETECTED

## 2018-08-17 ENCOUNTER — Encounter (HOSPITAL_COMMUNITY): Payer: Self-pay

## 2018-08-17 ENCOUNTER — Other Ambulatory Visit: Payer: Self-pay

## 2018-08-17 ENCOUNTER — Encounter (HOSPITAL_COMMUNITY)
Admission: RE | Admit: 2018-08-17 | Discharge: 2018-08-17 | Disposition: A | Discharge status: Home / Self Care | Payer: BC Managed Care – PPO | Source: Ambulatory Visit | Attending: Family Medicine | Admitting: Family Medicine

## 2018-08-17 DIAGNOSIS — M810 Age-related osteoporosis without current pathological fracture: Secondary | ICD-10-CM | POA: Diagnosis not present

## 2018-08-17 MED ORDER — SODIUM CHLORIDE 0.9 % IV SOLN
Freq: Once | INTRAVENOUS | Status: AC
  Administered 2018-08-17: 14:00:00 via INTRAVENOUS

## 2018-08-17 MED ORDER — ZOLEDRONIC ACID 5 MG/100ML IV SOLN
5.0000 mg | Freq: Once | INTRAVENOUS | Status: AC
  Administered 2018-08-17: 5 mg via INTRAVENOUS
  Filled 2018-08-17: qty 100

## 2018-09-01 ENCOUNTER — Other Ambulatory Visit: Payer: Self-pay | Admitting: Family Medicine

## 2018-09-13 ENCOUNTER — Encounter: Payer: Self-pay | Admitting: Allergy & Immunology

## 2018-09-14 ENCOUNTER — Encounter: Payer: Self-pay | Admitting: Allergy & Immunology

## 2018-09-14 ENCOUNTER — Other Ambulatory Visit: Payer: Self-pay

## 2018-09-14 ENCOUNTER — Ambulatory Visit: Payer: BC Managed Care – PPO | Admitting: Allergy & Immunology

## 2018-09-14 ENCOUNTER — Ambulatory Visit (INDEPENDENT_AMBULATORY_CARE_PROVIDER_SITE_OTHER): Payer: BC Managed Care – PPO | Admitting: Allergy & Immunology

## 2018-09-14 DIAGNOSIS — J302 Other seasonal allergic rhinitis: Secondary | ICD-10-CM

## 2018-09-14 DIAGNOSIS — J4540 Moderate persistent asthma, uncomplicated: Secondary | ICD-10-CM

## 2018-09-14 DIAGNOSIS — E507 Other ocular manifestations of vitamin A deficiency: Secondary | ICD-10-CM | POA: Diagnosis not present

## 2018-09-14 DIAGNOSIS — J3089 Other allergic rhinitis: Secondary | ICD-10-CM | POA: Diagnosis not present

## 2018-09-14 MED ORDER — MONTELUKAST SODIUM 10 MG PO TABS: 10.0000 mg | ORAL_TABLET | Freq: Every day | ORAL | 5 refills | Status: DC

## 2018-09-14 MED ORDER — ALBUTEROL SULFATE HFA 108 (90 BASE) MCG/ACT IN AERS: 2.0000 | INHALATION_SPRAY | Freq: Four times a day (QID) | RESPIRATORY_TRACT | 1 refills | Status: DC | PRN

## 2018-09-14 MED ORDER — BREO ELLIPTA 100-25 MCG/INH IN AEPB: INHALATION_SPRAY | RESPIRATORY_TRACT | 5 refills | Status: DC

## 2018-09-14 NOTE — Progress Notes (Signed)
RE: Denise Werner MRN: [id number] DOB: January 12, 1956 Date of Telemedicine Visit: 09/14/2018  Referring provider: Fayrene Helper, MD Primary care provider: Fayrene Helper, MD  Chief Complaint: Asthma (Televisit driving)   Telemedicine Follow Up Visit via Telephone: I connected with Denise Werner for a follow up on 09/14/18 by telephone and verified that [name] using two identifiers.   I discussed the limitations, risks, security and privacy concerns of performing an evaluation and management service by telephone and the availability of in person appointments. I also discussed with the patient that there may be a patient responsible charge related to this service. The patient expressed understanding and agreed to proceed.  Patient is in the car.  Provider is at the office.  Visit start time: 3:11 PM Visit end time: 3:24 PM Insurance consent/check in by: [facility] Medical consent and medical assistant/nurse: Ashely  History of Present Illness:  She is a 63 y.o. female, who is being followed for persistent asthma and S/PAR. Her previous allergy office visit was in May 2020 with myself.  At the last visit, she was having some shortness of breath and we obtained a d-dimer to rule out a pulmonary embolism.  This was normal thankfully.  We did give her an albuterol nebulizer machine and got Xopenex approved to be used and that as needed.  We continued the Breo 100/25 mcg 1 puff once daily and albuterol as needed.  For her allergies, we continued with allergy shots at the same schedule as well as Xhance 2 sprays per nostril up to twice daily and Zyrtec 10 mg daily.  We did give her a sample of Xyzal to try as well as a sample of Karbinal ER to use as needed.  For her conjunctivitis.  We already referred her to an ophthalmologist.  We continued the Pazeo 1 drop per eye up to twice daily, to rule out allergic conjunctivitis as a cause of her symptoms.   Asthma/Respiratory Symptom History: Her asthma is only somewhat controlled, but she has only had to use her rescue inhaler twice. ACT is 22, indicating excellent asthma control. She reports that she has SOB every "now and then". She feels it mostly when she is laying down at night. She does have GERD and is on medication for it. She does not take it on a regular basis. She will try taking it more regularly.   Allergic Rhinitis Symptom History: She does not notice any improvement with [facility] ER. She likes the Zyrtec over the Xyzal. She has been on Allegra in the past but it was years ago. She has not needed antibiotics.  She has been off of her allergy shots since we have not been in [place] on a routine basis.  She is looking forward to restarting these allergy shots when we are moved into the new space.  She did see the ophthalmologist and was diagnosed with clogged tear ducts.  Apparently they are going to watch it for a while before they possibly do surgery on this.  He is very thankful that we were able to send her to an ophthalmologist it was able to figure out this out.  Otherwise, there have been no changes to her past medical history, surgical history, family history, or social history.  Assessment and Plan:  Denise Werner is a 63 y.o. female with:  Moderate persistent asthma, uncomplicated   Seasonal and perennial allergic rhinitis(trees, weeds, grasses, indoor molds, outdoor molds, dust mites, cat and  cockroach)- with worsening symptoms since being off of the allergy shots  Bilateral conjunctivitis with xerophthalmia - diagnosed recently with clogged tear ducts bilaterally   1. Moderate persistent asthma, uncomplicated - I think that a lot of your symptoms are related to your not taking your reflux medication on a daily basis. - Try taking the Protonix daily to see if this can alleviate a lot of your symptoms.  - Daily controller medication(s): Breo 100/25 one puff once daily -  Prior to physical activity: ProAir 2 puffs 10-15 minutes before physical activity. - Rescue medications: Xopenex nebulizer every 4-6 hours as needed or ProAir 4 puffs every 4-6 hours as needed - Asthma control goals:  * Full participation in all desired activities (may need albuterol before activity) * Albuterol use two time or less a week on average (not counting use with activity) * Cough interfering with sleep two time or less a month * Oral steroids no more than once a year * No hospitalizations  2. Seasonal and perennial allergic rhinitis (trees, weeds, grasses, indoor molds, outdoor molds, dust mites, cat and cockroach) - Restart allergy shots next week.  - Continue with Xhance two sprays per nostril up to twice daily. - Continue with Zyrtec 10mg  daily. - We will provide samples of Allegra when you come in for your allergy shot next week.   - Consider nasal saline rinses 1-2 times daily to remove allergens from the nasal cavities as well as help with mucous clearance (this is especially helpful to do before the nasal sprays are given)  3. Conjunctivitis - Let us know how the appointment with the ophthalmologist goes.  - Continue with Pazeo one drop per eye up to twice daily (this will rule out allergies as a cause of his irritation).   4. Return in about 4 months (around 01/14/2019).    Diagnostics: None.  Medication List:  Current Outpatient Medications  Medication Sig Dispense Refill  . albuterol (PROVENTIL HFA;VENTOLIN HFA) 108 (90 Base) MCG/ACT inhaler Inhale 2 puffs into the lungs every 6 (six) hours as needed for wheezing or shortness of breath. 1 Inhaler 3  . amLODipine (NORVASC) 5 MG tablet Take 1 tablet (5 mg total) by mouth daily. (Patient taking differently: Take 5 mg by mouth at bedtime. ) 90 tablet 3  . BREO ELLIPTA 100-25 MCG/INH AEPB TAKE 1 PUFF BY MOUTH EVERY DAY (Patient taking differently: Inhale 1 puff into the lungs daily at 12 noon. ) 60 each 11  . Calcium  Carbonate-Vit D-Min (CALCIUM 1200) 1200-1000 MG-UNIT CHEW Chew 2 tablets by mouth daily.     . Carbinoxamine Maleate ER [facility] ER) 4 MG/5ML SUER Take 4 mg by mouth 2 (two) times daily as needed. 480 mL 3  . Fluticasone Propionate (XHANCE) 93 MCG/ACT EXHU Place 2 puffs into the nose 2 (two) times daily. 32 mL 11  . gabapentin (NEURONTIN) 100 MG capsule TAKE ONE CAPSULE BY MOUTH AT BEDTIME AS NEEDED 90 capsule 1  . ibuprofen (ADVIL,MOTRIN) 800 MG tablet Take 1 tablet (800 mg total) by mouth every 8 (eight) hours as needed. (Patient taking differently: Take 800 mg by mouth every 8 (eight) hours as needed for moderate pain. ) 30 tablet 1  . levalbuterol (XOPENEX) 1.25 MG/3ML nebulizer solution Take 1.25 mg by nebulization every 4 (four) hours as needed for wheezing. 72 mL 3  . montelukast (SINGULAIR) 10 MG tablet Take 1 tablet (10 mg total) by mouth at bedtime. (Patient taking differently: Take 10 mg by mouth  daily as needed (allergies). ) 30 tablet 3  . Olopatadine HCl (PAZEO) 0.7 % SOLN Place 1 drop into both eyes 2 (two) times a week. 1 Bottle 5  . Polyvinyl Alcohol-Povidone (REFRESH OP) Place 1 drop into both eyes 2 (two) times daily.    . Promethazine-DM 6.25-15 MG/5ML SOLN     . rosuvastatin (CRESTOR) 5 MG tablet TAKE 1 TABLET BY MOUTH EVERY DAY 90 tablet 3  . pantoprazole (PROTONIX) 20 MG tablet Take 1 tablet (20 mg total) by mouth daily. (Patient not taking: Reported on 09/14/2018) 30 tablet 5   No current facility-administered medications for this visit.    Allergies: Allergies  Allergen Reactions  . Latex Rash  . Hydrocodone Nausea And Vomiting and Nausea Only    Also causes dizziness and states feels like she has an outer body experience. Also causes dizziness and states feels like she has an outer body experience.  . Other Rash  . Sulfonamide Derivatives Rash   I reviewed her past medical history, social history, family history, and environmental history and no significant changes  have been reported from previous visits.  Review of Systems  Constitutional: Negative for activity change, appetite change, chills and fatigue.  HENT: Positive for postnasal drip and rhinorrhea. Negative for congestion, sinus pressure and sore throat.        Positive for sneezing.  Eyes: Negative for pain, discharge, redness and itching.  Respiratory: Negative for shortness of breath, wheezing and stridor.   Gastrointestinal: Negative for diarrhea, nausea and vomiting.  Endocrine: Negative for heat intolerance and polydipsia.  Musculoskeletal: Negative for arthralgias, joint swelling and myalgias.  Skin: Negative for rash.  Allergic/Immunologic: Negative for environmental allergies and food allergies.    Objective:  Physical exam not obtained as encounter was done via telephone.   Previous notes and tests were reviewed.  I discussed the assessment and treatment plan with the patient. The patient was provided an opportunity to ask questions and all were answered. The patient agreed with the plan and demonstrated an understanding of the instructions.   The patient was advised to call back or seek an in-person evaluation if the symptoms worsen or if the condition fails to improve as anticipated.  I provided 13 minutes of non-face-to-face time during this encounter.  It was my pleasure to participate in [facility] care today. Please feel free to contact me with any questions or concerns.   Sincerely,  Valentina Shaggy, MD

## 2018-09-14 NOTE — Addendum Note (Signed)
Addended by: Herbie Drape on: 09/14/2018 03:47 PM   Modules accepted: Orders

## 2018-09-14 NOTE — Patient Instructions (Addendum)
1. Moderate persistent asthma, uncomplicated - I think that a lot of your symptoms are related to your not taking your reflux medication on a daily basis. - Try taking the Protonix daily to see if this can alleviate a lot of your symptoms.  - Daily controller medication(s): Breo 100/25 one puff once daily - Prior to physical activity: ProAir 2 puffs 10-15 minutes before physical activity. - Rescue medications: Xopenex nebulizer every 4-6 hours as needed or ProAir 4 puffs every 4-6 hours as needed - Asthma control goals:  * Full participation in all desired activities (may need albuterol before activity) * Albuterol use two time or less a week on average (not counting use with activity) * Cough interfering with sleep two time or less a month * Oral steroids no more than once a year * No hospitalizations  2. Seasonal and perennial allergic rhinitis (trees, weeds, grasses, indoor molds, outdoor molds, dust mites, cat and cockroach) - Restart allergy shots next week.  - Continue with Xhance two sprays per nostril up to twice daily. - Continue with Zyrtec 10mg  daily. - We will provide samples of Allegra when you come in for your allergy shot next week.   - Consider nasal saline rinses 1-2 times daily to remove allergens from the nasal cavities as well as help with mucous clearance (this is especially helpful to do before the nasal sprays are given)  3. Conjunctivitis - Let us know how the appointment with the ophthalmologist goes.  - Continue with Pazeo one drop per eye up to twice daily (this will rule out allergies as a cause of his irritation).   4. Return in about 4 months (around 01/14/2019).   Please inform us of any Emergency Department visits, hospitalizations, or changes in symptoms. Call us before going to the ED for breathing or allergy symptoms since we might be able to fit you in for a sick visit. Feel free to contact us anytime with any questions, problems, or concerns.  It was  a pleasure to see you again today!  Websites that have reliable patient information: 1. American Academy of Asthma, Allergy, and Immunology: www.aaaai.org 2. Food Allergy Research and Education (FARE): foodallergy.org 3. Mothers of Asthmatics: http://www.asthmacommunitynetwork.org 4. American College of Allergy, Asthma, and Immunology: MonthlyElectricBill.co.uk   Make sure you are registered to vote! If you have moved or changed any of your contact information, you will need to get this updated before voting!

## 2018-09-21 ENCOUNTER — Ambulatory Visit (INDEPENDENT_AMBULATORY_CARE_PROVIDER_SITE_OTHER): Payer: BC Managed Care – PPO | Admitting: *Deleted

## 2018-09-21 DIAGNOSIS — J309 Allergic rhinitis, unspecified: Secondary | ICD-10-CM | POA: Diagnosis not present

## 2018-09-28 ENCOUNTER — Ambulatory Visit (INDEPENDENT_AMBULATORY_CARE_PROVIDER_SITE_OTHER): Payer: BC Managed Care – PPO

## 2018-09-28 DIAGNOSIS — J309 Allergic rhinitis, unspecified: Secondary | ICD-10-CM | POA: Diagnosis not present

## 2018-10-05 ENCOUNTER — Ambulatory Visit (INDEPENDENT_AMBULATORY_CARE_PROVIDER_SITE_OTHER): Payer: BC Managed Care – PPO

## 2018-10-05 DIAGNOSIS — J309 Allergic rhinitis, unspecified: Secondary | ICD-10-CM | POA: Diagnosis not present

## 2018-10-12 ENCOUNTER — Ambulatory Visit (INDEPENDENT_AMBULATORY_CARE_PROVIDER_SITE_OTHER): Payer: BC Managed Care – PPO

## 2018-10-12 DIAGNOSIS — J309 Allergic rhinitis, unspecified: Secondary | ICD-10-CM | POA: Diagnosis not present

## 2018-10-17 ENCOUNTER — Encounter: Payer: Self-pay | Admitting: Family Medicine

## 2018-10-19 ENCOUNTER — Ambulatory Visit (INDEPENDENT_AMBULATORY_CARE_PROVIDER_SITE_OTHER): Payer: BC Managed Care – PPO

## 2018-10-19 DIAGNOSIS — J309 Allergic rhinitis, unspecified: Secondary | ICD-10-CM | POA: Diagnosis not present

## 2018-10-22 ENCOUNTER — Ambulatory Visit: Payer: BC Managed Care – PPO

## 2018-10-22 DIAGNOSIS — J3089 Other allergic rhinitis: Secondary | ICD-10-CM

## 2018-10-22 NOTE — Progress Notes (Signed)
Reprint labels 

## 2018-10-22 NOTE — Progress Notes (Addendum)
Vials exp 10-22-18.  Reprint printer error.

## 2018-10-23 ENCOUNTER — Ambulatory Visit (INDEPENDENT_AMBULATORY_CARE_PROVIDER_SITE_OTHER): Payer: BC Managed Care – PPO

## 2018-10-23 ENCOUNTER — Other Ambulatory Visit: Payer: Self-pay

## 2018-10-23 DIAGNOSIS — Z23 Encounter for immunization: Secondary | ICD-10-CM

## 2018-11-02 ENCOUNTER — Ambulatory Visit (INDEPENDENT_AMBULATORY_CARE_PROVIDER_SITE_OTHER): Payer: BC Managed Care – PPO

## 2018-11-02 DIAGNOSIS — J309 Allergic rhinitis, unspecified: Secondary | ICD-10-CM

## 2018-11-09 ENCOUNTER — Ambulatory Visit (INDEPENDENT_AMBULATORY_CARE_PROVIDER_SITE_OTHER): Payer: BC Managed Care – PPO

## 2018-11-09 ENCOUNTER — Other Ambulatory Visit: Payer: Self-pay | Admitting: *Deleted

## 2018-11-09 DIAGNOSIS — Z20822 Contact with and (suspected) exposure to covid-19: Secondary | ICD-10-CM

## 2018-11-09 DIAGNOSIS — J309 Allergic rhinitis, unspecified: Secondary | ICD-10-CM

## 2018-11-11 LAB — NOVEL CORONAVIRUS, NAA: SARS-CoV-2, NAA: NOT DETECTED

## 2018-11-16 ENCOUNTER — Ambulatory Visit (INDEPENDENT_AMBULATORY_CARE_PROVIDER_SITE_OTHER): Payer: BC Managed Care – PPO

## 2018-11-16 DIAGNOSIS — J309 Allergic rhinitis, unspecified: Secondary | ICD-10-CM

## 2018-11-17 ENCOUNTER — Other Ambulatory Visit: Payer: Self-pay | Admitting: Family Medicine

## 2018-11-23 ENCOUNTER — Ambulatory Visit (INDEPENDENT_AMBULATORY_CARE_PROVIDER_SITE_OTHER): Payer: BC Managed Care – PPO

## 2018-11-23 DIAGNOSIS — J309 Allergic rhinitis, unspecified: Secondary | ICD-10-CM

## 2018-11-26 ENCOUNTER — Other Ambulatory Visit: Payer: Self-pay | Admitting: Family Medicine

## 2018-11-26 DIAGNOSIS — Z1231 Encounter for screening mammogram for malignant neoplasm of breast: Secondary | ICD-10-CM

## 2018-11-30 ENCOUNTER — Ambulatory Visit (INDEPENDENT_AMBULATORY_CARE_PROVIDER_SITE_OTHER): Payer: BC Managed Care – PPO

## 2018-11-30 DIAGNOSIS — J309 Allergic rhinitis, unspecified: Secondary | ICD-10-CM

## 2018-12-07 ENCOUNTER — Ambulatory Visit (INDEPENDENT_AMBULATORY_CARE_PROVIDER_SITE_OTHER): Payer: BC Managed Care – PPO

## 2018-12-07 DIAGNOSIS — J309 Allergic rhinitis, unspecified: Secondary | ICD-10-CM | POA: Diagnosis not present

## 2018-12-07 MED ORDER — KARBINAL ER 4 MG/5ML PO SUER: 4.0000 mg | Freq: Two times a day (BID) | ORAL | 1 refills | Status: DC | PRN

## 2018-12-14 ENCOUNTER — Encounter: Payer: Self-pay | Admitting: Family Medicine

## 2018-12-14 ENCOUNTER — Telehealth: Payer: Self-pay

## 2018-12-14 DIAGNOSIS — R7303 Prediabetes: Secondary | ICD-10-CM

## 2018-12-14 DIAGNOSIS — E785 Hyperlipidemia, unspecified: Secondary | ICD-10-CM

## 2018-12-14 DIAGNOSIS — E559 Vitamin D deficiency, unspecified: Secondary | ICD-10-CM

## 2018-12-14 DIAGNOSIS — I10 Essential (primary) hypertension: Secondary | ICD-10-CM

## 2018-12-14 NOTE — Telephone Encounter (Signed)
Labs re-ordered due to them expiring

## 2018-12-15 LAB — CBC
HCT: 38.3 % (ref 35.0–45.0)
Hemoglobin: 12.2 g/dL (ref 11.7–15.5)
MCH: 27.6 pg (ref 27.0–33.0)
MCHC: 31.9 g/dL — ABNORMAL LOW (ref 32.0–36.0)
MCV: 86.7 fL (ref 80.0–100.0)
MPV: 12.3 fL (ref 7.5–12.5)
Platelets: 217 10*3/uL (ref 140–400)
RBC: 4.42 10*6/uL (ref 3.80–5.10)
RDW: 12.8 % (ref 11.0–15.0)
WBC: 6.1 10*3/uL (ref 3.8–10.8)

## 2018-12-15 LAB — COMPLETE METABOLIC PANEL WITH GFR
AG Ratio: 1.4 (calc) (ref 1.0–2.5)
ALT: 16 U/L (ref 6–29)
AST: 18 U/L (ref 10–35)
Albumin: 4.2 g/dL (ref 3.6–5.1)
Alkaline phosphatase (APISO): 81 U/L (ref 37–153)
BUN: 11 mg/dL (ref 7–25)
CO2: 27 mmol/L (ref 20–32)
Calcium: 9.4 mg/dL (ref 8.6–10.4)
Chloride: 105 mmol/L (ref 98–110)
Creat: 0.8 mg/dL (ref 0.50–0.99)
GFR, Est African American: 92 mL/min/{1.73_m2} (ref >=60)
GFR, Est Non African American: 79 mL/min/{1.73_m2} (ref >=60)
Globulin: 2.9 g/dL (calc) (ref 1.9–3.7)
Glucose, Bld: 87 mg/dL (ref 65–139)
Potassium: 4.2 mmol/L (ref 3.5–5.3)
Sodium: 140 mmol/L (ref 135–146)
Total Bilirubin: 0.4 mg/dL (ref 0.2–1.2)
Total Protein: 7.1 g/dL (ref 6.1–8.1)

## 2018-12-15 LAB — HEMOGLOBIN A1C
Hgb A1c MFr Bld: 5.8 % of total Hgb — ABNORMAL HIGH (ref <5.7)
Mean Plasma Glucose: 120 (calc)
eAG (mmol/L): 6.6 (calc)

## 2018-12-15 LAB — TSH: TSH: 1.81 mIU/L (ref 0.40–4.50)

## 2018-12-15 LAB — LIPID PANEL
Cholesterol: 152 mg/dL (ref <200)
HDL: 51 mg/dL (ref >=50)
LDL Cholesterol (Calc): 89 mg/dL (calc)
Non-HDL Cholesterol (Calc): 101 mg/dL (calc) (ref <130)
Total CHOL/HDL Ratio: 3 (calc) (ref <5.0)
Triglycerides: 41 mg/dL (ref <150)

## 2018-12-15 LAB — VITAMIN D 25 HYDROXY (VIT D DEFICIENCY, FRACTURES): Vit D, 25-Hydroxy: 30 ng/mL (ref 30–100)

## 2018-12-16 ENCOUNTER — Encounter: Payer: Self-pay | Admitting: Family Medicine

## 2018-12-17 ENCOUNTER — Ambulatory Visit (HOSPITAL_COMMUNITY)
Admission: RE | Admit: 2018-12-17 | Discharge: 2018-12-17 | Disposition: A | Discharge status: Home / Self Care | Payer: BC Managed Care – PPO | Source: Ambulatory Visit | Attending: Family Medicine | Admitting: Family Medicine

## 2018-12-17 ENCOUNTER — Encounter: Payer: Self-pay | Admitting: Family Medicine

## 2018-12-17 ENCOUNTER — Ambulatory Visit (INDEPENDENT_AMBULATORY_CARE_PROVIDER_SITE_OTHER): Payer: BC Managed Care – PPO | Admitting: Family Medicine

## 2018-12-17 ENCOUNTER — Other Ambulatory Visit: Payer: Self-pay

## 2018-12-17 VITALS — BP 128/72 | HR 79 | Temp 97.5°F | Resp 15 | Ht 65.0 in | Wt 151.0 lb

## 2018-12-17 DIAGNOSIS — M25522 Pain in left elbow: Secondary | ICD-10-CM | POA: Diagnosis present

## 2018-12-17 DIAGNOSIS — F5104 Psychophysiologic insomnia: Secondary | ICD-10-CM

## 2018-12-17 DIAGNOSIS — Z0000 Encounter for general adult medical examination without abnormal findings: Secondary | ICD-10-CM | POA: Diagnosis not present

## 2018-12-17 DIAGNOSIS — M79632 Pain in left forearm: Secondary | ICD-10-CM | POA: Diagnosis present

## 2018-12-17 DIAGNOSIS — I10 Essential (primary) hypertension: Secondary | ICD-10-CM

## 2018-12-17 DIAGNOSIS — R7301 Impaired fasting glucose: Secondary | ICD-10-CM

## 2018-12-17 DIAGNOSIS — E785 Hyperlipidemia, unspecified: Secondary | ICD-10-CM

## 2018-12-17 MED ORDER — ROSUVASTATIN CALCIUM 5 MG PO TABS: 5.0000 mg | ORAL_TABLET | Freq: Every day | ORAL | 1 refills | Status: DC

## 2018-12-17 MED ORDER — AMLODIPINE BESYLATE 5 MG PO TABS: 5.0000 mg | ORAL_TABLET | Freq: Every day | ORAL | 1 refills | Status: DC

## 2018-12-17 MED ORDER — HYDROXYZINE HCL 10 MG PO TABS: ORAL_TABLET | ORAL | 3 refills | Status: DC
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
## 2018-12-17 NOTE — Assessment & Plan Note (Signed)
Annual exam as documented. Counseling done  re healthy lifestyle involving commitment to 150 minutes exercise per week, heart healthy diet, and attaining healthy weight.The importance of adequate sleep also discussed. Regular seat belt use and home safety, is also discussed. C Immunization and cancer screening needs are specifically addressed at this visit.

## 2018-12-17 NOTE — Assessment & Plan Note (Signed)
Sleep hygiene reviewed and written information offered also. Prescription sent for  medication needed.  

## 2018-12-21 ENCOUNTER — Encounter: Payer: Self-pay | Admitting: Family Medicine

## 2018-12-21 ENCOUNTER — Ambulatory Visit (INDEPENDENT_AMBULATORY_CARE_PROVIDER_SITE_OTHER): Payer: BC Managed Care – PPO

## 2018-12-21 DIAGNOSIS — J309 Allergic rhinitis, unspecified: Secondary | ICD-10-CM

## 2018-12-24 ENCOUNTER — Telehealth: Payer: Self-pay | Admitting: *Deleted

## 2018-12-24 NOTE — Telephone Encounter (Signed)
Made pt an appt 11-24 at 8:40

## 2018-12-25 ENCOUNTER — Encounter: Payer: Self-pay | Admitting: Family Medicine

## 2018-12-25 ENCOUNTER — Ambulatory Visit: Payer: BC Managed Care – PPO | Admitting: Family Medicine

## 2018-12-25 ENCOUNTER — Other Ambulatory Visit: Payer: Self-pay

## 2018-12-25 DIAGNOSIS — M5410 Radiculopathy, site unspecified: Secondary | ICD-10-CM | POA: Diagnosis not present

## 2018-12-25 DIAGNOSIS — I10 Essential (primary) hypertension: Secondary | ICD-10-CM

## 2018-12-25 MED ORDER — METHYLPREDNISOLONE ACETATE 80 MG/ML IJ SUSP
120.0000 mg | Freq: Once | INTRAMUSCULAR | Status: AC
  Administered 2018-12-25: 120 mg via INTRAMUSCULAR

## 2018-12-25 MED ORDER — PREDNISONE 20 MG PO TABS: ORAL_TABLET | ORAL | 0 refills | Status: DC

## 2018-12-25 MED ORDER — CYCLOBENZAPRINE HCL 10 MG PO TABS: 10.0000 mg | ORAL_TABLET | Freq: Every day | ORAL | 0 refills | Status: DC

## 2018-12-25 MED ORDER — IBUPROFEN 800 MG PO TABS: ORAL_TABLET | ORAL | 0 refills | Status: DC

## 2018-12-25 MED ORDER — KETOROLAC TROMETHAMINE 60 MG/2ML IM SOLN
60.0000 mg | Freq: Once | INTRAMUSCULAR | Status: AC
  Administered 2018-12-25: 60 mg via INTRAMUSCULAR

## 2018-12-25 NOTE — Patient Instructions (Signed)
F/U as before, call if you need me sooner.  You are treated today for acute back  And right leg pain due to arthritis.  Toradol 60 mg IM and Depo-Medrol 120 mg IM are administered in the office.  Ibuprofen prednisone and Flexeril are prescribed as discussed.  Thanks for choosing [facility], we consider it a privelige to serve you.

## 2018-12-25 NOTE — Progress Notes (Signed)
   Denise Werner     MRN: [id number]      DOB: [birth year]   HPI [name] is here x 1 month, worse in last 2 weeks, upto a 1-0 at times,  Has awakened radiates anteriorly to just above the knee, has aad similar on left, npow present on right side. Recently had a near fall catching her grand child whichlikely triggered current complaint  ROS See HPI  Denies joint pain, swelling and limitation in mobility. Denies headaches, seizures, numbness, or tingling. Denies depression, anxiety or insomnia. Denies skin break down or rash.   PE  BP 132/84   Pulse 81   Temp 98.5 F (36.9 C) (Temporal)   Resp 15   Ht 5\' 5"  (1.651 m)   Wt 152 lb (68.9 kg)   SpO2 98%   BMI 25.29 kg/m   Patient alert and oriented and in no cardiopulmonary distress.  HEENT: No facial asymmetry, EOMI,     Neck supple .  No plapable lymphadenopathy or mass in right groin, no warmth or erythema of right groin  ABD: Soft non tender.   Ext: No edema  [id number] though adequate  ROM lumbar  Spine, normal in houlders, hips and knees.  Skin: Intact, no ulcerations or rash noted.  Psych: Good eye contact, normal affect. .  CNS: CN 2-12 intact, power,  normal throughout.  Assessment & Plan  Back pain with right-sided radiculopathy Uncontrolled.Toradol 60  and depo medrol 120MG   administered IM in the office , to be followed by a short course of oral prednisone and NSAIDS.AND MUSCLE RELAXANT   HTN (hypertension) Controlled, no change in medication

## 2018-12-25 NOTE — Assessment & Plan Note (Signed)
Uncontrolled.Toradol 60  and depo medrol 120MG   administered IM in the office , to be followed by a short course of oral prednisone and NSAIDS.AND MUSCLE RELAXANT

## 2018-12-26 ENCOUNTER — Ambulatory Visit (INDEPENDENT_AMBULATORY_CARE_PROVIDER_SITE_OTHER): Payer: BC Managed Care – PPO

## 2018-12-26 DIAGNOSIS — J309 Allergic rhinitis, unspecified: Secondary | ICD-10-CM | POA: Diagnosis not present

## 2019-01-05 NOTE — Assessment & Plan Note (Signed)
Controlled, no change in medication  

## 2019-01-08 ENCOUNTER — Other Ambulatory Visit: Payer: Self-pay | Admitting: Family Medicine

## 2019-01-14 ENCOUNTER — Ambulatory Visit
Admission: RE | Admit: 2019-01-14 | Discharge: 2019-01-14 | Disposition: A | Discharge status: Home / Self Care | Payer: BC Managed Care – PPO | Source: Ambulatory Visit | Attending: Family Medicine | Admitting: Family Medicine

## 2019-01-14 ENCOUNTER — Other Ambulatory Visit: Payer: Self-pay

## 2019-01-14 DIAGNOSIS — Z1231 Encounter for screening mammogram for malignant neoplasm of breast: Secondary | ICD-10-CM

## 2019-01-30 ENCOUNTER — Ambulatory Visit (INDEPENDENT_AMBULATORY_CARE_PROVIDER_SITE_OTHER): Payer: BC Managed Care – PPO

## 2019-01-30 DIAGNOSIS — J309 Allergic rhinitis, unspecified: Secondary | ICD-10-CM | POA: Diagnosis not present

## 2019-02-15 ENCOUNTER — Other Ambulatory Visit: Payer: Self-pay | Admitting: Cardiology

## 2019-02-15 DIAGNOSIS — Z20822 Contact with and (suspected) exposure to covid-19: Secondary | ICD-10-CM

## 2019-02-16 ENCOUNTER — Other Ambulatory Visit: Payer: Self-pay

## 2019-02-16 ENCOUNTER — Ambulatory Visit
Admission: EM | Admit: 2019-02-16 | Discharge: 2019-02-16 | Discharge status: Absent without leave | Payer: BC Managed Care – PPO

## 2019-02-16 ENCOUNTER — Ambulatory Visit
Admission: EM | Admit: 2019-02-16 | Discharge: 2019-02-16 | Disposition: A | Discharge status: Home / Self Care | Payer: BC Managed Care – PPO | Attending: Emergency Medicine | Admitting: Emergency Medicine

## 2019-02-16 DIAGNOSIS — M545 Low back pain, unspecified: Secondary | ICD-10-CM

## 2019-02-16 LAB — POCT URINALYSIS DIP (MANUAL ENTRY)
Bilirubin, UA: NEGATIVE
Blood, UA: NEGATIVE
Glucose, UA: NEGATIVE mg/dL
Ketones, POC UA: NEGATIVE mg/dL
Leukocytes, UA: NEGATIVE
Nitrite, UA: NEGATIVE
Protein Ur, POC: NEGATIVE mg/dL
Spec Grav, UA: 1.01 (ref 1.010–1.025)
Urobilinogen, UA: 0.2 E.U./dL
pH, UA: 6.5 (ref 5.0–8.0)

## 2019-02-16 LAB — NOVEL CORONAVIRUS, NAA: SARS-CoV-2, NAA: NOT DETECTED

## 2019-02-16 MED ORDER — CYCLOBENZAPRINE HCL 10 MG PO TABS: 10.0000 mg | ORAL_TABLET | Freq: Three times a day (TID) | ORAL | 0 refills | Status: DC | PRN

## 2019-02-16 MED ORDER — IBUPROFEN 600 MG PO TABS: 600.0000 mg | ORAL_TABLET | Freq: Four times a day (QID) | ORAL | 0 refills | Status: DC | PRN

## 2019-02-16 MED ORDER — PREDNISONE 10 MG PO TABS: 20.0000 mg | ORAL_TABLET | Freq: Every day | ORAL | 0 refills | Status: DC

## 2019-02-16 NOTE — Discharge Instructions (Signed)
Rest, ice and heat as needed Ensure adequate ROM as tolerated. Prescribed ibuprofen as needed for inflammation and pain relief Prescribed flexeril  for muscle spasm.  Do not drive or operate heavy machinery while taking this medication Return here or go to ER if you have any new or worsening symptoms such as numbness/tingling of the inner thighs, loss of bladder or bowel control, headache/blurry vision, nausea/vomiting, confusion/altered mental status, dizziness, weakness, passing out, imbalance, etc..Marland Kitchen

## 2019-02-16 NOTE — ED Triage Notes (Signed)
Pt presents with lower right back pain that radiates to right hip, pt denies injury. States she was awoken early in the morning in pain

## 2019-02-16 NOTE — ED Provider Notes (Signed)
[facility]    CSN: [id number] Arrival date & time: 02/16/19  1254      History   Chief Complaint Chief Complaint  Patient presents with  . Back Pain    HPI Denise Werner is a 64 y.o. female.   Denise Werner 61 y old complaint of right back pain that began 1 day ago.  Pain radiated to the right hip.  Denies trauma or injury.  She localizes the pain to the right low back.  She describes the pain as constant and achy.  She has tried OTC medications with mild relief.  Her symptoms are made worse with ROM.  She denies similar symptoms in the past.    The history is provided by the patient. No language interpreter was used.    Past Medical History:  Diagnosis Date  . Asthma   . Bladder pain   . Chronic neck pain   . Dyslipidemia   . Frequency of urination   . GERD (gastroesophageal reflux disease) OCCASIONAL  . H/O hiatal hernia   . Interstitial cystitis   . Nocturia   . Short of breath on exertion   . Urgency of urination     Patient Active Problem List   Diagnosis Date Noted  . Left elbow pain 12/17/2018  . Left forearm pain 12/17/2018  . Seasonal and perennial allergic rhinitis 03/08/2018  . 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 Back pain with right-sided radiculopathy 07/08/2013  . GERD (gastroesophageal reflux disease) 12/18/2012  . HTN (hypertension) 09/13/2011  . Hemorrhoid 10/06/2010  . Osteopenia 09/20/2010  . GANGLION CYST 03/17/2010  . Prediabetes 10/08/2009  . Environmental allergies 01/11/2008  . Cough variant asthma 01/11/2008  . Insomnia 09/16/2007  . Hyperlipidemia LDL goal <100 08/16/2007    Past Surgical History:  Procedure Laterality Date  . ABDOMINAL HYSTERECTOMY     W/ BILATERAL SALPINGOOPHECTOMY  . APPENDECTOMY    . BIOPSY  04/11/2018   Procedure: BIOPSY;  Surgeon: Rogene Houston, MD;  [facility];  Service: Endoscopy;;  colon polyp with biopsy forceps  . BUNIONECTOMY  04-14-2003   LEFT FOOT  W/ POST REMOVAL  INTERNAL FIXATION DEVICE  VIA SILVER BUNIONECTOMY  . CHOLECYSTECTOMY  1998  . COLONOSCOPY N/A 04/11/2018   Procedure: COLONOSCOPY;  Surgeon: Rogene Houston, MD;  [facility]: [facility];  Service: Endoscopy;  Laterality: N/A;  730  . CYSTO WITH HYDRODISTENSION  09/12/2011   Procedure: CYSTOSCOPY/HYDRODISTENSION;  Surgeon: Ailene Rud, MD;  Location: [facility];  Service: Urology;  Laterality: N/A;  [facility] M & P AND M & P  . CYSTO/ HOD/ INSTILLATION THERAPY  11-17-2008;   12-02-1999   INTERSTITIAL CYSTITIS  . ESOPHAGOGASTRODUODENOSCOPY N/A 05/01/2013   Procedure: ESOPHAGOGASTRODUODENOSCOPY (EGD);  Surgeon: Rogene Houston, MD;  [facility];  Service: Endoscopy;  Laterality: N/A;  100  . FINGER SURGERY  10-13-2009   left 5th finger ARTHRODESIS  . FOOT ARTHROPLASTY  05-03-2004   FIFTHE TOE RIGHT FOOT  . KNEE ARTHROSCOPY  JAN 2013   LEFT KNEE    OB History   No obstetric history on file.      Home Medications    Prior to Admission medications   Medication Sig Start Date End Date Taking? Authorizing Provider  albuterol (VENTOLIN HFA) 108 (90 Base) MCG/ACT inhaler Inhale 2 puffs into the lungs every 6 (six) hours as needed for wheezing or shortness of breath. 09/14/18   Valentina Shaggy, MD  amLODipine [facility])  5 MG tablet Take 1 tablet (5 mg total) by mouth daily. 12/17/18   Fayrene Helper, MD  Calcium Carbonate-Vit D-Min (CALCIUM 1200) 1200-1000 MG-UNIT CHEW Chew 2 tablets by mouth daily.     [provider]  Carbinoxamine Maleate ER [facility] ER) 4 MG/5ML SUER Take 4 mg by mouth 2 (two) times daily as needed. 12/07/18   Valentina Shaggy, MD  cyclobenzaprine (FLEXERIL) 10 MG tablet Take 1 tablet (10 mg total) by mouth 3 (three) times daily as needed for muscle spasms. 02/16/19   Zacchary Pompei, Darrelyn Hillock, FNP  fluticasone furoate-vilanterol (BREO ELLIPTA) 100-25 MCG/INH AEPB TAKE 1 PUFF BY MOUTH EVERY DAY 09/14/18   Valentina Shaggy,  MD  Fluticasone Propionate Truett Perna) 93 MCG/ACT EXHU Place 2 puffs into the nose 2 (two) times daily. 04/04/18   Valentina Shaggy, MD  gabapentin (NEURONTIN) 100 MG capsule TAKE ONE CAPSULE BY MOUTH AT BEDTIME AS NEEDED 05/23/18   Fayrene Helper, MD  hydrOXYzine (ATARAX/VISTARIL) 10 MG tablet TAKE 1 TABLET BY MOUTH AT BEDTIME FOR SLEEP 01/08/19   Fayrene Helper, MD  ibuprofen (ADVIL) 600 MG tablet Take 1 tablet (600 mg total) by mouth every 6 (six) hours as needed. Take with food 02/16/19   Sanaz Scarlett, Darrelyn Hillock, FNP  levalbuterol (XOPENEX) 1.25 MG/3ML nebulizer solution Take 1.25 mg by nebulization every 4 (four) hours as needed for wheezing. 06/16/18   Valentina Shaggy, MD  montelukast (SINGULAIR) 10 MG tablet Take 1 tablet (10 mg total) by mouth at bedtime. 09/14/18   Valentina Shaggy, MD  Olopatadine HCl (PAZEO) 0.7 % SOLN Place 1 drop into both eyes 2 (two) times a week. 03/15/18   Valentina Shaggy, MD  pantoprazole (PROTONIX) 20 MG tablet Take 1 tablet (20 mg total) by mouth daily. 06/02/18   Fayrene Helper, MD  Polyvinyl Alcohol-Povidone (REFRESH OP) Place 1 drop into both eyes 2 (two) times daily.    [provider]  predniSONE (DELTASONE) 10 MG tablet Take 2 tablets (20 mg total) by mouth daily. 02/16/19   Nevan Creighton, Darrelyn Hillock, FNP  rosuvastatin (CRESTOR) 5 MG tablet Take 1 tablet (5 mg total) by mouth daily. 12/17/18   Fayrene Helper, MD    Family History Family History  Problem Relation Age of Onset  . Cancer Brother   . Lupus Sister   . Hypertension Sister   . Aneurysm Sister        x4    Social History Social History   Tobacco Use  . Smoking status: Never Smoker  . Smokeless tobacco: Never Used  Substance Use Topics  . Alcohol use: No    Alcohol/week: 0.0 standard drinks  . Drug use: No     Allergies   Latex, Hydrocodone, Other, and Sulfonamide derivatives   Review of Systems Review of Systems  Constitutional: Negative.     Respiratory: Negative.   Cardiovascular: Negative.   Musculoskeletal: Positive for back pain.  Neurological: Negative.   All other systems reviewed and are negative.    Physical Exam Triage Vital Signs ED Triage Vitals  Enc Vitals Group     BP      Pulse      Resp      Temp      Temp src      SpO2      Weight      Height      Head Circumference      Peak Flow  Pain Score      Pain Loc      Pain Edu?      Excl. in [place]?    No data found.  Updated Vital Signs BP (!) 149/77   Pulse 91   Temp 98.2 F (36.8 C)   Resp 18   SpO2 99%   Visual Acuity Right Eye Distance:   Left Eye Distance:   Bilateral Distance:    Right Eye Near:   Left Eye Near:    Bilateral Near:     Physical Exam Vitals and nursing note reviewed.  Constitutional:      General: She is not in acute distress.    Appearance: Normal appearance. She is normal weight. She is not ill-appearing or toxic-appearing.  Cardiovascular:     Rate and Rhythm: Normal rate and regular rhythm.     Pulses: Normal pulses.     Heart sounds: Normal heart sounds. No murmur.  Pulmonary:     Effort: Pulmonary effort is normal. No respiratory distress.     Breath sounds: Normal breath sounds. No wheezing.  Chest:     Chest wall: No tenderness.  Musculoskeletal:        General: Tenderness present. No swelling, deformity or signs of injury. Normal range of motion.     Right lower leg: No edema.     Left lower leg: No edema.     Comments: Back:  Patient ambulates from chair to exam table without difficulty.  Inspection: Skin clear and intact without obvious swelling, erythema, or ecchymosis. Warm to the touch  Palpation:Tenderness present ROM:  Strength: 5/5 hip flexion, 5/5 knee extension, 5/5 knee flexion, 5/5 plantar flexion, 5/5 dorsiflexion  DTR: Patellar tendon reflex intact    Neurological:     Mental Status: She is alert.      UC Treatments / Results  Labs (all labs ordered are listed, but only  abnormal results are displayed) Labs Reviewed  POCT URINALYSIS DIP (MANUAL ENTRY)    EKG   Radiology No results found.  Procedures Procedures (including critical care time)  Medications Ordered in UC Medications - No data to display  Initial Impression / Assessment and Plan / UC Course  I have reviewed the triage vital signs and the nursing notes.  Pertinent labs & imaging results that were available during my care of the patient were reviewed by me and considered in my medical decision making (see chart for details).   Patient is stable for discharge Ibuprofen will be prescribed for pain Flexeril for muscle spasm Short-term prednisone for inflammation Advised patient to return for worsening of symptoms   Final Clinical Impressions(s) / UC Diagnoses   Final diagnoses:  Acute right-sided low back pain without sciatica     Discharge Instructions     Rest, ice and heat as needed Ensure adequate ROM as tolerated. Prescribed ibuprofen as needed for inflammation and pain relief Prescribed flexeril  for muscle spasm.  Do not drive or operate heavy machinery while taking this medication Return here or go to ER if you have any new or worsening symptoms such as numbness/tingling of the inner thighs, loss of bladder or bowel control, headache/blurry vision, nausea/vomiting, confusion/altered mental status, dizziness, weakness, passing out, imbalance, etc...      ED Prescriptions    Medication Sig Dispense Auth. Provider   ibuprofen (ADVIL) 600 MG tablet Take 1 tablet (600 mg total) by mouth every 6 (six) hours as needed. Take with food 30 tablet Devan Danzer, Darrelyn Hillock, FNP  predniSONE (DELTASONE) 10 MG tablet Take 2 tablets (20 mg total) by mouth daily. 15 tablet Roberto Hlavaty, Darrelyn Hillock, FNP   cyclobenzaprine (FLEXERIL) 10 MG tablet Take 1 tablet (10 mg total) by mouth 3 (three) times daily as needed for muscle spasms. 30 tablet Marilou Barnfield, Darrelyn Hillock, FNP     PDMP not reviewed this  encounter.   Emerson Monte, [place] 02/16/19 1341

## 2019-02-20 ENCOUNTER — Ambulatory Visit: Payer: BC Managed Care – PPO | Attending: Internal Medicine

## 2019-02-20 ENCOUNTER — Other Ambulatory Visit: Payer: Self-pay

## 2019-02-20 DIAGNOSIS — Z20822 Contact with and (suspected) exposure to covid-19: Secondary | ICD-10-CM | POA: Insufficient documentation

## 2019-02-21 LAB — NOVEL CORONAVIRUS, NAA: SARS-CoV-2, NAA: NOT DETECTED

## 2019-03-01 ENCOUNTER — Ambulatory Visit (INDEPENDENT_AMBULATORY_CARE_PROVIDER_SITE_OTHER): Payer: BC Managed Care – PPO

## 2019-03-01 DIAGNOSIS — J309 Allergic rhinitis, unspecified: Secondary | ICD-10-CM | POA: Diagnosis not present

## 2019-03-22 ENCOUNTER — Ambulatory Visit (INDEPENDENT_AMBULATORY_CARE_PROVIDER_SITE_OTHER): Payer: BC Managed Care – PPO

## 2019-03-22 DIAGNOSIS — J309 Allergic rhinitis, unspecified: Secondary | ICD-10-CM | POA: Diagnosis not present

## 2019-03-25 ENCOUNTER — Telehealth: Payer: Self-pay | Admitting: Family Medicine

## 2019-03-25 ENCOUNTER — Other Ambulatory Visit: Payer: Self-pay | Admitting: Family Medicine

## 2019-03-25 ENCOUNTER — Encounter: Payer: Self-pay | Admitting: Family Medicine

## 2019-03-25 DIAGNOSIS — M25522 Pain in left elbow: Secondary | ICD-10-CM

## 2019-03-25 DIAGNOSIS — M79632 Pain in left forearm: Secondary | ICD-10-CM

## 2019-03-25 NOTE — Telephone Encounter (Signed)
Please order CT scan of left forearm, asap, order is entered, pt c/o of pain rated at an 8. I discussed with her, if this is normal , I will refer her to Ortho. She has numbness and tingling of 3 middle fingers, she denies significant elbow or neck pain. Pain all started in the Fall following indirect trauma, and has worsened significantly in past 2 weeks with no new trauma

## 2019-03-26 ENCOUNTER — Encounter: Payer: Self-pay | Admitting: Family Medicine

## 2019-03-26 ENCOUNTER — Other Ambulatory Visit: Payer: Self-pay | Admitting: Family Medicine

## 2019-03-26 DIAGNOSIS — G5642 Causalgia of left upper limb: Secondary | ICD-10-CM

## 2019-03-26 DIAGNOSIS — M79632 Pain in left forearm: Secondary | ICD-10-CM

## 2019-03-26 NOTE — Progress Notes (Signed)
Mri forearm

## 2019-03-26 NOTE — Telephone Encounter (Signed)
They denied the request after the first question (if she had been evaluated in the office by a provider) Only option is to do a peer to peer review [phone]

## 2019-03-26 NOTE — Telephone Encounter (Signed)
Authorization number for aPH, from 2/23 to 09/21/2019 is [id number], please let pt know and schedule Also please let her know I have also ordered an X ray of her neck , she can have that the same  day. Image is for MRI of left forearm w/wo contrast ,

## 2019-03-27 NOTE — Telephone Encounter (Signed)
Pt aware of march 18 mri appt and will go have the scan of neck done this week

## 2019-04-01 ENCOUNTER — Other Ambulatory Visit: Payer: Self-pay

## 2019-04-01 ENCOUNTER — Ambulatory Visit (HOSPITAL_COMMUNITY)
Admission: RE | Admit: 2019-04-01 | Discharge: 2019-04-01 | Disposition: A | Discharge status: Home / Self Care | Payer: BC Managed Care – PPO | Source: Ambulatory Visit | Attending: Family Medicine | Admitting: Family Medicine

## 2019-04-01 DIAGNOSIS — G5642 Causalgia of left upper limb: Secondary | ICD-10-CM | POA: Insufficient documentation

## 2019-04-02 ENCOUNTER — Encounter: Payer: Self-pay | Admitting: Family Medicine

## 2019-04-05 ENCOUNTER — Ambulatory Visit (INDEPENDENT_AMBULATORY_CARE_PROVIDER_SITE_OTHER): Payer: BC Managed Care – PPO

## 2019-04-05 DIAGNOSIS — J309 Allergic rhinitis, unspecified: Secondary | ICD-10-CM | POA: Diagnosis not present

## 2019-04-11 DIAGNOSIS — J3089 Other allergic rhinitis: Secondary | ICD-10-CM | POA: Diagnosis not present

## 2019-04-11 NOTE — Progress Notes (Signed)
VIALS EXP 04-09-20

## 2019-04-12 ENCOUNTER — Ambulatory Visit (INDEPENDENT_AMBULATORY_CARE_PROVIDER_SITE_OTHER): Payer: BC Managed Care – PPO

## 2019-04-12 DIAGNOSIS — J309 Allergic rhinitis, unspecified: Secondary | ICD-10-CM | POA: Diagnosis not present

## 2019-04-18 ENCOUNTER — Ambulatory Visit (HOSPITAL_COMMUNITY): Admission: RE | Admit: 2019-04-18 | Payer: BC Managed Care – PPO | Source: Ambulatory Visit

## 2019-04-19 ENCOUNTER — Ambulatory Visit (INDEPENDENT_AMBULATORY_CARE_PROVIDER_SITE_OTHER): Payer: BC Managed Care – PPO

## 2019-04-19 DIAGNOSIS — J309 Allergic rhinitis, unspecified: Secondary | ICD-10-CM

## 2019-04-26 ENCOUNTER — Ambulatory Visit (INDEPENDENT_AMBULATORY_CARE_PROVIDER_SITE_OTHER): Payer: BC Managed Care – PPO

## 2019-04-26 DIAGNOSIS — J309 Allergic rhinitis, unspecified: Secondary | ICD-10-CM

## 2019-05-10 ENCOUNTER — Ambulatory Visit (INDEPENDENT_AMBULATORY_CARE_PROVIDER_SITE_OTHER): Payer: BC Managed Care – PPO

## 2019-05-10 DIAGNOSIS — J309 Allergic rhinitis, unspecified: Secondary | ICD-10-CM | POA: Diagnosis not present

## 2019-05-14 ENCOUNTER — Ambulatory Visit (HOSPITAL_COMMUNITY): Payer: BC Managed Care – PPO

## 2019-05-17 ENCOUNTER — Ambulatory Visit (INDEPENDENT_AMBULATORY_CARE_PROVIDER_SITE_OTHER): Payer: BC Managed Care – PPO

## 2019-05-17 DIAGNOSIS — J309 Allergic rhinitis, unspecified: Secondary | ICD-10-CM

## 2019-05-21 ENCOUNTER — Telehealth: Payer: Self-pay | Admitting: Family Medicine

## 2019-05-21 ENCOUNTER — Other Ambulatory Visit: Payer: Self-pay | Admitting: Family Medicine

## 2019-05-21 DIAGNOSIS — M79602 Pain in left arm: Secondary | ICD-10-CM

## 2019-05-21 DIAGNOSIS — G8929 Other chronic pain: Secondary | ICD-10-CM

## 2019-05-21 NOTE — Progress Notes (Signed)
amb ortho  

## 2019-05-21 NOTE — Telephone Encounter (Signed)
7  month h/o left  shoulder and arm pain following fall, c/o right shoulder pain also, needs Ortho eval as x ray on left normal and still has intermittent pain , though less, specifically wants [facility]  preferred due to location, I have entered referral plks fllow through and notify pt of appt in fo, thanks

## 2019-05-24 ENCOUNTER — Ambulatory Visit (INDEPENDENT_AMBULATORY_CARE_PROVIDER_SITE_OTHER): Payer: BC Managed Care – PPO

## 2019-05-24 DIAGNOSIS — J309 Allergic rhinitis, unspecified: Secondary | ICD-10-CM

## 2019-05-27 ENCOUNTER — Encounter: Payer: Self-pay | Admitting: Family Medicine

## 2019-06-14 ENCOUNTER — Ambulatory Visit (INDEPENDENT_AMBULATORY_CARE_PROVIDER_SITE_OTHER): Payer: BC Managed Care – PPO

## 2019-06-14 DIAGNOSIS — J309 Allergic rhinitis, unspecified: Secondary | ICD-10-CM | POA: Diagnosis not present

## 2019-06-16 ENCOUNTER — Other Ambulatory Visit: Payer: Self-pay | Admitting: Family Medicine

## 2019-06-17 ENCOUNTER — Other Ambulatory Visit: Payer: Self-pay | Admitting: Family Medicine

## 2019-06-17 ENCOUNTER — Ambulatory Visit: Payer: BC Managed Care – PPO | Admitting: Family Medicine

## 2019-06-21 ENCOUNTER — Ambulatory Visit (INDEPENDENT_AMBULATORY_CARE_PROVIDER_SITE_OTHER): Payer: BC Managed Care – PPO

## 2019-06-21 DIAGNOSIS — J309 Allergic rhinitis, unspecified: Secondary | ICD-10-CM | POA: Diagnosis not present

## 2019-06-28 ENCOUNTER — Ambulatory Visit (INDEPENDENT_AMBULATORY_CARE_PROVIDER_SITE_OTHER): Payer: BC Managed Care – PPO

## 2019-06-28 DIAGNOSIS — J309 Allergic rhinitis, unspecified: Secondary | ICD-10-CM

## 2019-07-06 ENCOUNTER — Other Ambulatory Visit: Payer: Self-pay | Admitting: Family Medicine

## 2019-07-10 ENCOUNTER — Other Ambulatory Visit: Payer: Self-pay

## 2019-07-10 DIAGNOSIS — I10 Essential (primary) hypertension: Secondary | ICD-10-CM

## 2019-08-09 ENCOUNTER — Ambulatory Visit (INDEPENDENT_AMBULATORY_CARE_PROVIDER_SITE_OTHER): Payer: BC Managed Care – PPO

## 2019-08-09 DIAGNOSIS — J309 Allergic rhinitis, unspecified: Secondary | ICD-10-CM

## 2019-08-15 ENCOUNTER — Other Ambulatory Visit (HOSPITAL_COMMUNITY): Payer: BC Managed Care – PPO

## 2019-08-15 ENCOUNTER — Encounter (HOSPITAL_COMMUNITY): Payer: Self-pay

## 2019-08-16 ENCOUNTER — Ambulatory Visit (INDEPENDENT_AMBULATORY_CARE_PROVIDER_SITE_OTHER): Payer: BC Managed Care – PPO

## 2019-08-16 DIAGNOSIS — J309 Allergic rhinitis, unspecified: Secondary | ICD-10-CM | POA: Diagnosis not present

## 2019-08-19 ENCOUNTER — Encounter (HOSPITAL_COMMUNITY): Payer: Self-pay

## 2019-08-19 ENCOUNTER — Encounter (HOSPITAL_COMMUNITY)
Admission: RE | Admit: 2019-08-19 | Discharge: 2019-08-19 | Disposition: A | Discharge status: Home / Self Care | Payer: BC Managed Care – PPO | Source: Ambulatory Visit | Attending: Family Medicine | Admitting: Family Medicine

## 2019-08-19 ENCOUNTER — Other Ambulatory Visit: Payer: Self-pay

## 2019-08-19 DIAGNOSIS — M810 Age-related osteoporosis without current pathological fracture: Secondary | ICD-10-CM | POA: Diagnosis present

## 2019-08-19 LAB — POCT I-STAT, CHEM 8
BUN: 12 mg/dL (ref 8–23)
Calcium, Ion: 1.2 mmol/L (ref 1.15–1.40)
Chloride: 105 mmol/L (ref 98–111)
Creatinine, Ser: 0.7 mg/dL (ref 0.44–1.00)
Glucose, Bld: 96 mg/dL (ref 70–99)
HCT: 37 % (ref 36.0–46.0)
Hemoglobin: 12.6 g/dL (ref 12.0–15.0)
Potassium: 3.5 mmol/L (ref 3.5–5.1)
Sodium: 144 mmol/L (ref 135–145)
TCO2: 26 mmol/L (ref 22–32)

## 2019-08-19 MED ORDER — SODIUM CHLORIDE 0.9 % IV SOLN: Freq: Once | INTRAVENOUS | Status: AC

## 2019-08-19 MED ORDER — ZOLEDRONIC ACID 5 MG/100ML IV SOLN
5.0000 mg | Freq: Once | INTRAVENOUS | Status: AC
  Administered 2019-08-19: 5 mg via INTRAVENOUS

## 2019-08-30 ENCOUNTER — Encounter: Payer: Self-pay | Admitting: Family Medicine

## 2019-09-02 ENCOUNTER — Encounter: Payer: Self-pay | Admitting: Family Medicine

## 2019-09-02 ENCOUNTER — Ambulatory Visit: Payer: BC Managed Care – PPO | Admitting: Family Medicine

## 2019-09-02 ENCOUNTER — Other Ambulatory Visit: Payer: Self-pay

## 2019-09-02 VITALS — BP 139/69 | HR 94 | Resp 16 | Ht 66.0 in | Wt 159.0 lb

## 2019-09-02 DIAGNOSIS — M25474 Effusion, right foot: Secondary | ICD-10-CM

## 2019-09-02 DIAGNOSIS — M25471 Effusion, right ankle: Secondary | ICD-10-CM

## 2019-09-02 DIAGNOSIS — Z780 Asymptomatic menopausal state: Secondary | ICD-10-CM

## 2019-09-02 DIAGNOSIS — E559 Vitamin D deficiency, unspecified: Secondary | ICD-10-CM

## 2019-09-02 DIAGNOSIS — I10 Essential (primary) hypertension: Secondary | ICD-10-CM | POA: Diagnosis not present

## 2019-09-02 DIAGNOSIS — M25472 Effusion, left ankle: Secondary | ICD-10-CM

## 2019-09-02 DIAGNOSIS — T733XXA Exhaustion due to excessive exertion, initial encounter: Secondary | ICD-10-CM

## 2019-09-02 DIAGNOSIS — M25561 Pain in right knee: Secondary | ICD-10-CM

## 2019-09-02 DIAGNOSIS — E785 Hyperlipidemia, unspecified: Secondary | ICD-10-CM | POA: Diagnosis not present

## 2019-09-02 DIAGNOSIS — M8580 Other specified disorders of bone density and structure, unspecified site: Secondary | ICD-10-CM

## 2019-09-02 DIAGNOSIS — M25475 Effusion, left foot: Secondary | ICD-10-CM

## 2019-09-02 DIAGNOSIS — K219 Gastro-esophageal reflux disease without esophagitis: Secondary | ICD-10-CM

## 2019-09-02 DIAGNOSIS — M25461 Effusion, right knee: Secondary | ICD-10-CM

## 2019-09-02 DIAGNOSIS — E663 Overweight: Secondary | ICD-10-CM

## 2019-09-02 DIAGNOSIS — R7989 Other specified abnormal findings of blood chemistry: Secondary | ICD-10-CM

## 2019-09-02 LAB — LIPID PANEL
Cholesterol: 215 mg/dL — ABNORMAL HIGH (ref <200)
HDL: 59 mg/dL (ref >=50)
LDL Cholesterol (Calc): 141 mg/dL (calc) — ABNORMAL HIGH
Non-HDL Cholesterol (Calc): 156 mg/dL (calc) — ABNORMAL HIGH (ref <130)
Total CHOL/HDL Ratio: 3.6 (calc) (ref <5.0)
Triglycerides: 62 mg/dL (ref <150)

## 2019-09-02 LAB — HEPATIC FUNCTION PANEL
AG Ratio: 1.3 (calc) (ref 1.0–2.5)
ALT: 14 U/L (ref 6–29)
AST: 17 U/L (ref 10–35)
Albumin: 4 g/dL (ref 3.6–5.1)
Alkaline phosphatase (APISO): 98 U/L (ref 37–153)
Bilirubin, Direct: 0.1 mg/dL (ref 0.0–0.2)
Globulin: 3 g/dL (calc) (ref 1.9–3.7)
Indirect Bilirubin: 0.2 mg/dL (calc) (ref 0.2–1.2)
Total Bilirubin: 0.3 mg/dL (ref 0.2–1.2)
Total Protein: 7 g/dL (ref 6.1–8.1)

## 2019-09-02 LAB — BASIC METABOLIC PANEL WITHOUT GFR
BUN: 11 mg/dL (ref 7–25)
CO2: 28 mmol/L (ref 20–32)
Calcium: 8.8 mg/dL (ref 8.6–10.4)
Chloride: 108 mmol/L (ref 98–110)
Creat: 0.73 mg/dL (ref 0.50–0.99)
GFR, Est African American: 102 mL/min/1.73m2
GFR, Est Non African American: 88 mL/min/1.73m2
Glucose, Bld: 98 mg/dL (ref 65–139)
Potassium: 4.3 mmol/L (ref 3.5–5.3)
Sodium: 142 mmol/L (ref 135–146)

## 2019-09-02 LAB — TEST AUTHORIZATION

## 2019-09-02 MED ORDER — ROSUVASTATIN CALCIUM 5 MG PO TABS: 5.0000 mg | ORAL_TABLET | Freq: Every day | ORAL | 1 refills | Status: DC

## 2019-09-02 MED ORDER — PANTOPRAZOLE SODIUM 20 MG PO TBEC: 20.0000 mg | DELAYED_RELEASE_TABLET | Freq: Every day | ORAL | 1 refills | Status: DC

## 2019-09-02 MED ORDER — ROSUVASTATIN CALCIUM 10 MG PO TABS: 10.0000 mg | ORAL_TABLET | Freq: Every day | ORAL | 1 refills | Status: DC

## 2019-09-02 NOTE — Progress Notes (Signed)
Denise Werner     MRN: [id number]      DOB: 30-Apr-1955   HPI Ms. Gohr is here for follow up and re-evaluation of chronic medical conditions, medication management and review of any available recent lab and radiology data.  Preventive health is updated, specifically  Cancer screening and Immunization.   Questions or concerns regarding consultations or procedures which the PT has had in the interim are  addressed. The PT denies any adverse reactions to current medications since the last visit.  Acute swelling of right knee above the knee 5 days ago, notice in the pm, right ankle tight intermittent x 2 to 3 weeks, left ankle also noted to be slightly swollen Drinking excess baking soda last week and has changed her eating habits , states she has had some symptom relief and wants to hold off on GI re eval as has not had dilation when last seen by GI and had normal swallowing study  Increased exertional fatigue, met abolic syndrome wants heart checked Currently being evaluated by Ortho for localized left SI pain , has upcoming MRI    ROS Denies recent fever or chills. Denies sinus pressure, nasal congestion, ear pain or sore throat. Denies chest congestion, productive cough or wheezing. Denies chest pains, palpitations , PND or orthopnea, notes exertional fatigue Denies abdominal pain, nausea, vomiting,diarrhea or constipation.   Denies dysuria, frequency, hesitancy or incontinence.  Denies headaches, seizures, numbness, or tingling. Denies depression, anxiety or insomnia. Denies skin break down or rash.   PE  BP 139/69   Pulse 94   Resp 16   Ht _0  (1.676 m)   Wt 159 lb (72.1 kg)   SpO2 98%   BMI 25.66 kg/m   Patient alert and oriented and in no cardiopulmonary distress.  HEENT: No facial asymmetry, EOMI,     Neck supple .  Chest: Clear to auscultation bilaterally.  CVS: S1, S2 no murmurs, no S3.Regular rate.  ABD: Soft non tender.   Ext: No edema  MS:  Adequate ROM spine, shoulders, hips and knees. Supra patellear swelling of right knee and posterior tenderness Right ankle deformed and slightly swollen, left mildly deformed, no significant swelling Skin: Intact, no ulcerations or rash noted.  Psych: Good eye contact, normal affect. Memory intact not anxious or depressed appearing.  CNS: CN 2-12 intact, power,  normal throughout.no focal deficits noted.   Assessment & Plan  Pain and swelling of knee, right Acute onset of suprapatellar right knee pain and swlling with posterior tenderness, no known injury , first episode, refer ortho  Bilateral swelling of feet and ankles Right greaterhtank left ankle swelling refer Ortho  HTN (hypertension) Not at goal, needs to start regular exercise and work on 5 poinf d weight loss by [employer] choice DASH diet and commitment to daily physical activity for a minimum of 30 minutes discussed and encouraged, as a part of hypertension management. The importance of attaining a healthy weight is also discussed.  BP/Weight 09/02/2019 08/19/2019 02/16/2019 12/25/2018 12/17/2018 08/17/2018 8/37/2902  Systolic BP 111 552 080 223 361 224 497  Diastolic BP 69 61 77 84 72 70 78  Wt. (Lbs) 159 147 - 152 151 - -  BMI 25.66 24.46 - 25.29 25.13 - -       Hyperlipidemia LDL goal <100 Hyperlipidemia:Low fat diet discussed and encouraged.   Lipid Panel  Lab Results  Component Value Date   CHOL 215 (H) 08/30/2019   HDL 59 08/30/2019  LDLCALC 141 (H) 08/30/2019   TRIG 62 08/30/2019   CHOLHDL 3.6 08/30/2019   Deteriorated, increase crestor to 10 mg and change diet    GERD (gastroesophageal reflux disease) uncointrolled symptoms in recent times, managing this with changing diet and eating earlier , will message for I referral if deemed necessary  Osteopenia rept dexa and refer to Endo for eval and ongoing management, has completed reclast treatment per standard protocol  Fatigue C/o new exertional  fatigue, concerned re possibility of CAD, risk inc prediabetes, met X , hyperlipidemia and HTN refer for Card eval  Overweight (BMI 25.0-29.9)  Patient re-educated about  the importance of commitment to a  minimum of 150 minutes of exercise per week as able.  The importance of healthy food choices with portion control discussed, as well as eating regularly and within a 12 hour window most days. The need to choose "clean , green" food 50 to 75% of the time is discussed, as well as to make water the primary drink and set a goal of 64 ounces water daily.    Weight /BMI 09/02/2019 08/19/2019 12/25/2018  WEIGHT 159 lb 147 lb 152 lb  HEIGHT _0  _1  _2   BMI 25.66 kg/m2 24.46 kg/m2 25.29 kg/m2

## 2019-09-02 NOTE — Patient Instructions (Addendum)
Annual physical exam with MD end Nov or early December, call if you need me sooner  You are referred for bone density test, if abnormal will refer you to Endo ( [facility])  You are referred to [facility] / [facility] re right knee pain and swelling, and right ankle swelling   Please reduce fat in diet  Increased dose of crestor to 10 mg daily  Fasting lipid, cmp and EGFR, TSH, cBC, TSH and vit D 5 days before annual exam   You are referred to Cardiology for eval for CAD  It is important that you exercise regularly at least 30 minutes 5 times a week. If you develop chest pain, have severe difficulty breathing, or feel very tired, stop exercising immediately and seek medical attention   Think about what you will eat, plan ahead. Choose " clean, green, fresh or frozen" over canned, processed or packaged foods which are more sugary, salty and fatty. 70 to 75% of food eaten should be vegetables and fruit. Three meals at set times with snacks allowed between meals, but they must be fruit or vegetables. Aim to eat over a 12 hour period , example 7 am to 7 pm, and STOP after  your last meal of the day. Drink water,generally about 64 ounces per day, no other drink is as healthy. Fruit juice is best enjoyed in a healthy way, by EATING the fruit.   Thanks for choosing [facility], we consider it a privelige to serve you.

## 2019-09-03 ENCOUNTER — Encounter: Payer: Self-pay | Admitting: Family Medicine

## 2019-09-03 ENCOUNTER — Telehealth: Payer: Self-pay | Admitting: "Endocrinology

## 2019-09-03 DIAGNOSIS — M25461 Effusion, right knee: Secondary | ICD-10-CM | POA: Insufficient documentation

## 2019-09-03 DIAGNOSIS — R5383 Other fatigue: Secondary | ICD-10-CM | POA: Insufficient documentation

## 2019-09-03 DIAGNOSIS — M25471 Effusion, right ankle: Secondary | ICD-10-CM | POA: Insufficient documentation

## 2019-09-03 DIAGNOSIS — E663 Overweight: Secondary | ICD-10-CM | POA: Insufficient documentation

## 2019-09-03 NOTE — Assessment & Plan Note (Signed)
  Patient re-educated about  the importance of commitment to a  minimum of 150 minutes of exercise per week as able.  The importance of healthy food choices with portion control discussed, as well as eating regularly and within a 12 hour window most days. The need to choose "clean , green" food 50 to 75% of the time is discussed, as well as to make water the primary drink and set a goal of 64 ounces water daily.    Weight /BMI 09/02/2019 08/19/2019 12/25/2018  WEIGHT 159 lb 147 lb 152 lb  HEIGHT 5\' 6"  5\' 5"  5\' 5"   BMI 25.66 kg/m2 24.46 kg/m2 25.29 kg/m2

## 2019-09-03 NOTE — Assessment & Plan Note (Signed)
Right greaterhtank left ankle swelling refer Ortho

## 2019-09-03 NOTE — Telephone Encounter (Signed)
We received a referral for this patient for the follow.  Z78.0 (ICD-10-CM) - Post-menopause  M85.80 (ICD-10-CM) - Osteopenia after menopause  Do we see this?

## 2019-09-03 NOTE — Assessment & Plan Note (Signed)
uncointrolled symptoms in recent times, managing this with changing diet and eating earlier , will message for I referral if deemed necessary

## 2019-09-03 NOTE — Assessment & Plan Note (Signed)
C/o new exertional fatigue, concerned re possibility of CAD, risk inc prediabetes, met X , hyperlipidemia and HTN refer for Card eval

## 2019-09-03 NOTE — Assessment & Plan Note (Signed)
Hyperlipidemia:Low fat diet discussed and encouraged.   Lipid Panel  Lab Results  Component Value Date   CHOL 215 (H) 08/30/2019   HDL 59 08/30/2019   LDLCALC 141 (H) 08/30/2019   TRIG 62 08/30/2019   CHOLHDL 3.6 08/30/2019   Deteriorated, increase crestor to 10 mg and change diet

## 2019-09-03 NOTE — Assessment & Plan Note (Signed)
rept dexa and refer to Endo for eval and ongoing management, has completed reclast treatment per standard protocol

## 2019-09-03 NOTE — Telephone Encounter (Signed)
We can see her for Osteopenia but she will benefit from Ob/Gyn consult for her postmenopausal concerns.

## 2019-09-03 NOTE — Assessment & Plan Note (Signed)
Acute onset of suprapatellar right knee pain and swlling with posterior tenderness, no known injury , first episode, refer ortho

## 2019-09-03 NOTE — Assessment & Plan Note (Signed)
Not at goal, needs to start regular exercise and work on 5 poinf d weight loss by [employer] choice DASH diet and commitment to daily physical activity for a minimum of 30 minutes discussed and encouraged, as a part of hypertension management. The importance of attaining a healthy weight is also discussed.  BP/Weight 09/02/2019 08/19/2019 02/16/2019 12/25/2018 12/17/2018 08/17/2018 5/46/2703  Systolic BP 500 [phone]  Diastolic BP 69 61 77 84 72 70 78  Wt. (Lbs) 159 147 - 152 151 - -  BMI 25.66 24.46 - 25.29 25.13 - -

## 2019-09-13 ENCOUNTER — Encounter: Payer: Self-pay | Admitting: Orthopedic Surgery

## 2019-09-17 ENCOUNTER — Telehealth: Payer: BC Managed Care – PPO | Admitting: Family Medicine

## 2019-09-17 ENCOUNTER — Ambulatory Visit: Payer: BC Managed Care – PPO | Admitting: Family Medicine

## 2019-09-20 ENCOUNTER — Ambulatory Visit (INDEPENDENT_AMBULATORY_CARE_PROVIDER_SITE_OTHER): Payer: BC Managed Care – PPO

## 2019-09-20 DIAGNOSIS — J309 Allergic rhinitis, unspecified: Secondary | ICD-10-CM | POA: Diagnosis not present

## 2019-09-30 ENCOUNTER — Encounter: Payer: Self-pay | Admitting: Family Medicine

## 2019-10-06 ENCOUNTER — Other Ambulatory Visit: Payer: Self-pay

## 2019-10-06 ENCOUNTER — Ambulatory Visit: Payer: Self-pay

## 2019-10-06 ENCOUNTER — Ambulatory Visit
Admission: EM | Admit: 2019-10-06 | Discharge: 2019-10-06 | Disposition: A | Discharge status: Home / Self Care | Payer: BC Managed Care – PPO | Attending: Emergency Medicine | Admitting: Emergency Medicine

## 2019-10-06 DIAGNOSIS — Z1152 Encounter for screening for COVID-19: Secondary | ICD-10-CM

## 2019-10-07 LAB — NOVEL CORONAVIRUS, NAA: SARS-CoV-2, NAA: NOT DETECTED

## 2019-10-11 ENCOUNTER — Ambulatory Visit (INDEPENDENT_AMBULATORY_CARE_PROVIDER_SITE_OTHER): Payer: BC Managed Care – PPO

## 2019-10-11 ENCOUNTER — Ambulatory Visit: Payer: BC Managed Care – PPO

## 2019-10-11 DIAGNOSIS — J309 Allergic rhinitis, unspecified: Secondary | ICD-10-CM | POA: Diagnosis not present

## 2019-10-21 ENCOUNTER — Other Ambulatory Visit: Payer: Self-pay

## 2019-10-21 ENCOUNTER — Ambulatory Visit: Payer: BC Managed Care – PPO

## 2019-10-28 ENCOUNTER — Ambulatory Visit: Payer: BC Managed Care – PPO | Admitting: "Endocrinology

## 2019-11-01 ENCOUNTER — Ambulatory Visit (INDEPENDENT_AMBULATORY_CARE_PROVIDER_SITE_OTHER): Payer: BC Managed Care – PPO

## 2019-11-01 DIAGNOSIS — J309 Allergic rhinitis, unspecified: Secondary | ICD-10-CM

## 2019-11-04 ENCOUNTER — Other Ambulatory Visit: Payer: Self-pay | Admitting: Family Medicine

## 2019-11-08 ENCOUNTER — Ambulatory Visit (INDEPENDENT_AMBULATORY_CARE_PROVIDER_SITE_OTHER): Payer: BC Managed Care – PPO

## 2019-11-08 DIAGNOSIS — J309 Allergic rhinitis, unspecified: Secondary | ICD-10-CM | POA: Diagnosis not present

## 2019-11-15 ENCOUNTER — Ambulatory Visit (INDEPENDENT_AMBULATORY_CARE_PROVIDER_SITE_OTHER): Payer: BC Managed Care – PPO

## 2019-11-15 ENCOUNTER — Ambulatory Visit: Payer: BC Managed Care – PPO | Admitting: Internal Medicine

## 2019-11-15 ENCOUNTER — Other Ambulatory Visit: Payer: Self-pay

## 2019-11-15 VITALS — BP 126/84 | HR 88 | Ht 66.0 in | Wt 155.6 lb

## 2019-11-15 DIAGNOSIS — I10 Essential (primary) hypertension: Secondary | ICD-10-CM

## 2019-11-15 DIAGNOSIS — R0602 Shortness of breath: Secondary | ICD-10-CM

## 2019-11-15 DIAGNOSIS — J309 Allergic rhinitis, unspecified: Secondary | ICD-10-CM | POA: Diagnosis not present

## 2019-11-15 NOTE — Patient Instructions (Signed)
Medication Instructions:  Your physician recommends that you continue on your current medications as directed. Please refer to the Current Medication list given to you today.  *If you need a refill on your cardiac medications before your next appointment, please call your pharmacy*   Lab Work: NONE   If you have labs (blood work) drawn today and your tests are completely normal, you will receive your results only by: . MyChart Message (if you have MyChart) OR . A paper copy in the mail If you have any lab test that is abnormal or we need to change your treatment, we will call you to review the results.   Testing/Procedures: Your physician has requested that you have an echocardiogram. Echocardiography is a painless test that uses sound waves to create images of your heart. It provides your doctor with information about the size and shape of your heart and how well your heart's chambers and valves are working. This procedure takes approximately one hour. There are no restrictions for this procedure.   Follow-Up: At [facility], you and your health needs are our priority.  As part of our continuing mission to provide you with exceptional heart care, we have created designated Provider Care Teams.  These Care Teams include your primary Cardiologist (physician) and Advanced Practice Providers (APPs -  Physician Assistants and Nurse Practitioners) who all work together to provide you with the care you need, when you need it.  We recommend signing up for the patient portal called "MyChart".  Sign up information is provided on this After Visit Summary.  MyChart is used to connect with patients for Virtual Visits (Telemedicine).  Patients are able to view lab/test results, encounter notes, upcoming appointments, etc.  Non-urgent messages can be sent to your provider as well.   To learn more about what you can do with MyChart, go to [email or web address].    Your next appointment:    Pending  test results   The format for your next appointment:   In Person  Provider:   Paula Ross, MD   Other Instructions Thank you for choosing [facility]!    

## 2019-11-15 NOTE — Progress Notes (Signed)
Cardiology Office Note   Date:  11/15/2019   ID:  Denise Werner, Denise Werner 11-16-1955, MRN [id number]  PCP:  Fayrene Helper, MD  Cardiologist:   Dorris Carnes, MD   Pt presents for eval of fatigue, SOB     History of Present Illness: Denise Werner is a 64 y.o. female who is followed by Bari Mantis.   She was seen in clinic on 09/02/19  Hx of HTN  She complained of some exertional fatigue   Pt wanted to have a cardiac evaluation   Pt notes fatigue and SOB with exertion for the past  few months   Walking from parking lot to office is a little winded    She goes to gym  Walks 2.5 miles per hour for 30 min   NOt faster    No PND  She denies CP   Current Meds  Medication Sig  . albuterol (VENTOLIN HFA) 108 (90 Base) MCG/ACT inhaler Inhale 2 puffs into the lungs every 6 (six) hours as needed for wheezing or shortness of breath.  Marland Kitchen amLODipine (NORVASC) 5 MG tablet TAKE 1 TABLET BY MOUTH EVERY DAY  . Calcium Carbonate-Vit D-Min (CALCIUM 1200) 1200-1000 MG-UNIT CHEW Chew 2 tablets by mouth daily.   . Carbinoxamine Maleate ER [facility]) 4 MG/5ML SUER Take 4 mg by mouth 2 (two) times daily as needed.  . cyclobenzaprine (FLEXERIL) 10 MG tablet TAKE 1 TABLET BY MOUTH THREE TIMES A DAY AS NEEDED FOR MUSCLE SPASMS  . fluticasone furoate-vilanterol (BREO ELLIPTA) 100-25 MCG/INH AEPB TAKE 1 PUFF BY MOUTH EVERY DAY  . Fluticasone Propionate (XHANCE) 93 MCG/ACT EXHU Place 2 puffs into the nose 2 (two) times daily.  Marland Kitchen gabapentin (NEURONTIN) 100 MG capsule TAKE 1 CAPSULE BY MOUTH AT BEDTIME AS NEEDED  . ibuprofen (ADVIL) 600 MG tablet Take 1 tablet (600 mg total) by mouth every 6 (six) hours as needed. Take with food  . levalbuterol (XOPENEX) 1.25 MG/3ML nebulizer solution Take 1.25 mg by nebulization every 4 (four) hours as needed for wheezing.  . montelukast (SINGULAIR) 10 MG tablet TAKE 1 TABLET BY MOUTH EVERYDAY AT BEDTIME  . pantoprazole (PROTONIX) 20 MG tablet Take 1 tablet (20 mg total) by  mouth daily.  . Polyvinyl Alcohol-Povidone (REFRESH OP) Place 1 drop into both eyes 2 (two) times daily.  . rosuvastatin (CRESTOR) 10 MG tablet Take 1 tablet (10 mg total) by mouth daily.  . zoledronic acid (RECLAST) 5 MG/100ML SOLN injection Inject 5 mg into the vein.     Allergies:   Latex, Hydrocodone, Other, and Sulfonamide derivatives   Past Medical History:  Diagnosis Date  . Asthma   . Bladder pain   . Chronic neck pain   . Dyslipidemia   . Frequency of urination   . GERD (gastroesophageal reflux disease) OCCASIONAL  . H/O hiatal hernia   . Interstitial cystitis   . Nocturia   . Short of breath on exertion   . Urgency of urination     Past Surgical History:  Procedure Laterality Date  . ABDOMINAL HYSTERECTOMY     W/ BILATERAL SALPINGOOPHECTOMY  . APPENDECTOMY    . BIOPSY  04/11/2018   Procedure: BIOPSY;  Surgeon: Rogene Houston, MD;  [facility];  Service: Endoscopy;;  colon polyp with biopsy forceps  . BUNIONECTOMY  04-14-2003   LEFT FOOT  W/ POST REMOVAL INTERNAL FIXATION DEVICE  VIA SILVER BUNIONECTOMY  . CHOLECYSTECTOMY  1998  . COLONOSCOPY N/A 04/11/2018  Procedure: COLONOSCOPY;  Surgeon: Rogene Houston, MD;  [facility];  Service: Endoscopy;  Laterality: N/A;  730  . CYSTO WITH HYDRODISTENSION  09/12/2011   Procedure: CYSTOSCOPY/HYDRODISTENSION;  Surgeon: Ailene Rud, MD;  Location: [facility];  Service: Urology;  Laterality: N/A;  INSTILL M & P AND M & P  . CYSTO/ HOD/ INSTILLATION THERAPY  11-17-2008;   12-02-1999   INTERSTITIAL CYSTITIS  . ESOPHAGOGASTRODUODENOSCOPY N/A 05/01/2013   Procedure: ESOPHAGOGASTRODUODENOSCOPY (EGD);  Surgeon: Rogene Houston, MD;  [facility];  Service: Endoscopy;  Laterality: N/A;  100  . FINGER SURGERY  10-13-2009   left 5th finger ARTHRODESIS  . FOOT ARTHROPLASTY  05-03-2004   FIFTHE TOE RIGHT FOOT  . KNEE ARTHROSCOPY  JAN 2013   LEFT KNEE     Social History:   The patient  reports that she has never smoked. She has never used smokeless tobacco. She reports that she does not drink alcohol and does not use drugs.   Family History:  The patient's family history includes Aneurysm in her sister; Cancer in her brother; Hypertension in her sister; Lupus in her sister.    ROS:  Please see the history of present illness. All other systems are reviewed and  Negative to the above problem except as noted.    PHYSICAL EXAM: VS:  BP 126/84   Pulse 88   Ht 5\' 6"  (1.676 m)   Wt 155 lb 9.6 oz (70.6 kg)   SpO2 99%   BMI 25.11 kg/m   GEN: Well nourished, well developed, in no acute distress  HEENT: normal  Neck: no JVD, carotid bruits Cardiac: RRR; no murmurs  No LE edema  Respiratory:  clear to auscultation bilaterally,  GI: soft, nontender, nondistended, + BS  No hepatomegaly  MS: no deformity Moving all extremities   Skin: warm and dry, no rash Neuro:  Strength and sensation are intact Psych: euthymic mood, full affect   EKG:  EKG is ordered today.  SR 82  Nonspecific ST T wave changes   Lipid Panel    Component Value Date/Time   CHOL 215 (H) 08/30/2019 0856   TRIG 62 08/30/2019 0856   HDL 59 08/30/2019 0856   CHOLHDL 3.6 08/30/2019 0856   VLDL 10 06/22/2016 0831   LDLCALC 141 (H) 08/30/2019 0856      Wt Readings from Last 3 Encounters:  11/15/19 155 lb 9.6 oz (70.6 kg)  09/02/19 159 lb (72.1 kg)  08/19/19 147 lb (66.7 kg)      ASSESSMENT AND PLAN:  1  Dyspnea/fatigue  Pt with increased DOE   Exam is unremarkable   VOlume status is OK   I need to review recent labs (CBC, TSH)   Otherwise will set up for echo to eval systolic / diastlic function Further eval/rx based on test results   Possible functional study vs CT  2  HTN  BP is controlled today   3  HL  Pt on Crestor  Last LDL 144  Was much better a year ago   Discussed diet, intermitt fasting   Keep on meds    Follow in future with diet changes    Current medicines are  reviewed at length with the patient today.  The patient does not have concerns regarding medicines.  Signed, Dorris Carnes, MD  11/15/2019 2:38 PM    [address], [place], [place]  [address] Phone: [phone]; Fax: [phone]    

## 2019-11-18 ENCOUNTER — Ambulatory Visit: Payer: BC Managed Care – PPO | Admitting: "Endocrinology

## 2019-11-20 ENCOUNTER — Other Ambulatory Visit: Payer: Self-pay

## 2019-11-20 ENCOUNTER — Ambulatory Visit (HOSPITAL_COMMUNITY)
Admission: RE | Admit: 2019-11-20 | Discharge: 2019-11-20 | Disposition: A | Discharge status: Home / Self Care | Payer: BC Managed Care – PPO | Source: Ambulatory Visit | Attending: Internal Medicine | Admitting: Internal Medicine

## 2019-11-20 DIAGNOSIS — R0602 Shortness of breath: Secondary | ICD-10-CM | POA: Diagnosis present

## 2019-11-20 LAB — ECHOCARDIOGRAM COMPLETE
Area-P 1/2: 3.76 cm2
S' Lateral: 2.67 cm

## 2019-11-20 NOTE — Progress Notes (Signed)
*  PRELIMINARY RESULTS* Echocardiogram 2D Echocardiogram has been performed.  Denise Werner 11/20/2019, 3:59 PM

## 2019-11-21 DIAGNOSIS — J3089 Other allergic rhinitis: Secondary | ICD-10-CM

## 2019-11-21 NOTE — Progress Notes (Signed)
VIALS EXP 11-20-20 

## 2019-11-22 ENCOUNTER — Ambulatory Visit (INDEPENDENT_AMBULATORY_CARE_PROVIDER_SITE_OTHER): Payer: BC Managed Care – PPO

## 2019-11-22 DIAGNOSIS — J309 Allergic rhinitis, unspecified: Secondary | ICD-10-CM | POA: Diagnosis not present

## 2019-11-26 ENCOUNTER — Telehealth: Payer: Self-pay | Admitting: Internal Medicine

## 2019-11-26 NOTE — Telephone Encounter (Signed)
New message    Patient calling to get her echo results

## 2019-11-26 NOTE — Telephone Encounter (Signed)
Results of echo given to patient.Since echo is normal, patient does not want to schedule myoview until she speaks with Dr.Ross. She feels her SOB is her asthma.     I will forward to Dr.Ross

## 2019-11-27 NOTE — Telephone Encounter (Signed)
Left VM with pt  Msg I left was OK to observe.   If no wheezing though and / or inhalers not helping would recomm further testing  Told patient to call back then

## 2019-11-29 ENCOUNTER — Ambulatory Visit (INDEPENDENT_AMBULATORY_CARE_PROVIDER_SITE_OTHER): Payer: BC Managed Care – PPO

## 2019-11-29 DIAGNOSIS — J309 Allergic rhinitis, unspecified: Secondary | ICD-10-CM

## 2019-12-12 ENCOUNTER — Ambulatory Visit
Admission: EM | Admit: 2019-12-12 | Discharge: 2019-12-12 | Disposition: A | Discharge status: Home / Self Care | Payer: BC Managed Care – PPO | Attending: Emergency Medicine | Admitting: Emergency Medicine

## 2019-12-12 ENCOUNTER — Encounter: Payer: Self-pay | Admitting: Emergency Medicine

## 2019-12-12 ENCOUNTER — Other Ambulatory Visit: Payer: Self-pay

## 2019-12-12 DIAGNOSIS — Z1152 Encounter for screening for COVID-19: Secondary | ICD-10-CM | POA: Diagnosis not present

## 2019-12-12 DIAGNOSIS — R52 Pain, unspecified: Secondary | ICD-10-CM

## 2019-12-12 DIAGNOSIS — R519 Headache, unspecified: Secondary | ICD-10-CM | POA: Diagnosis not present

## 2019-12-12 DIAGNOSIS — R110 Nausea: Secondary | ICD-10-CM

## 2019-12-12 MED ORDER — ONDANSETRON 4 MG PO TBDP
4.0000 mg | ORAL_TABLET | Freq: Three times a day (TID) | ORAL | 0 refills | Status: DC | PRN
Start: 2019-12-12 — End: 2021-02-16

## 2019-12-12 NOTE — ED Provider Notes (Signed)
[facility]   [id number] 12/12/19 Arrival Time: 6144   CC: COVID symptoms  SUBJECTIVE: History from: patient.  Denise Werner is a 64 y.o. female who presents to the urgent care with a complaint of chills, fatigue, body aches, headaches and nausea for the past few days.  Reports symptom is getting worse yesterday.  Denies sick exposure to COVID, flu or strep.  Denies recent travel.  Has tried OTC medication without relief.  Denies aggravating factors.  Denies previous symptoms in the past.   Denies sinus pain, rhinorrhea, sore throat, SOB, wheezing, chest pain, nausea, changes in bowel or bladder habits.    ROS: As per HPI.  All other pertinent ROS negative.      Past Medical History:  Diagnosis Date  . Asthma   . Bladder pain   . Chronic neck pain   . Dyslipidemia   . Frequency of urination   . GERD (gastroesophageal reflux disease) OCCASIONAL  . H/O hiatal hernia   . Interstitial cystitis   . Nocturia   . Short of breath on exertion   . Urgency of urination    Past Surgical History:  Procedure Laterality Date  . ABDOMINAL HYSTERECTOMY     W/ BILATERAL SALPINGOOPHECTOMY  . APPENDECTOMY    . BIOPSY  04/11/2018   Procedure: BIOPSY;  Surgeon: Rogene Houston, MD;  [facility];  Service: Endoscopy;;  colon polyp with biopsy forceps  . BUNIONECTOMY  04-14-2003   LEFT FOOT  W/ POST REMOVAL INTERNAL FIXATION DEVICE  VIA SILVER BUNIONECTOMY  . CHOLECYSTECTOMY  1998  . COLONOSCOPY N/A 04/11/2018   Procedure: COLONOSCOPY;  Surgeon: Rogene Houston, MD;  [facility];  Service: Endoscopy;  Laterality: N/A;  730  . CYSTO WITH HYDRODISTENSION  09/12/2011   Procedure: CYSTOSCOPY/HYDRODISTENSION;  Surgeon: Ailene Rud, MD;  Location[facility];  Service: Urology;  Laterality: N/A;  INSTILL M & P AND M & P  . CYSTO/ HOD/ INSTILLATION THERAPY  11-17-2008;   12-02-1999   INTERSTITIAL CYSTITIS  . ESOPHAGOGASTRODUODENOSCOPY  N/A 05/01/2013   Procedure: ESOPHAGOGASTRODUODENOSCOPY (EGD);  Surgeon: Rogene Houston, MD;  [facility];  Service: Endoscopy;  Laterality: N/A;  100  . FINGER SURGERY  10-13-2009   left 5th finger ARTHRODESIS  . FOOT ARTHROPLASTY  05-03-2004   FIFTHE TOE RIGHT FOOT  . KNEE ARTHROSCOPY  JAN 2013   LEFT KNEE   Allergies  Allergen Reactions  . Latex Rash  . Hydrocodone Nausea And Vomiting and Nausea Only    Also causes dizziness and states feels like she has an outer body experience. Also causes dizziness and states feels like she has an outer body experience.  . Other Rash  . Sulfonamide Derivatives Rash   No current facility-administered medications on file prior to encounter.   Current Outpatient Medications on File Prior to Encounter  Medication Sig Dispense Refill  . albuterol (VENTOLIN HFA) 108 (90 Base) MCG/ACT inhaler Inhale 2 puffs into the lungs every 6 (six) hours as needed for wheezing or shortness of breath. 18 g 1  . amLODipine (NORVASC) 5 MG tablet TAKE 1 TABLET BY MOUTH EVERY DAY 90 tablet 1  . Calcium Carbonate-Vit D-Min (CALCIUM 1200) 1200-1000 MG-UNIT CHEW Chew 2 tablets by mouth daily.     . Carbinoxamine Maleate ER [facility]) 4 MG/5ML SUER Take 4 mg by mouth 2 (two) times daily as needed. 480 mL 1  . cyclobenzaprine (FLEXERIL) 10 MG tablet TAKE  1 TABLET BY MOUTH THREE TIMES A DAY AS NEEDED FOR MUSCLE SPASMS 30 tablet 0  . fluticasone furoate-vilanterol (BREO ELLIPTA) 100-25 MCG/INH AEPB TAKE 1 PUFF BY MOUTH EVERY DAY 60 each 5  . Fluticasone Propionate (XHANCE) 93 MCG/ACT EXHU Place 2 puffs into the nose 2 (two) times daily. 32 mL 11  . gabapentin (NEURONTIN) 100 MG capsule TAKE 1 CAPSULE BY MOUTH AT BEDTIME AS NEEDED 90 capsule 1  . ibuprofen (ADVIL) 600 MG tablet Take 1 tablet (600 mg total) by mouth every 6 (six) hours as needed. Take with food 30 tablet 0  . levalbuterol (XOPENEX) 1.25 MG/3ML nebulizer solution Take 1.25 mg by nebulization every 4  (four) hours as needed for wheezing. 72 mL 3  . montelukast (SINGULAIR) 10 MG tablet TAKE 1 TABLET BY MOUTH EVERYDAY AT BEDTIME 90 tablet 1  . pantoprazole (PROTONIX) 20 MG tablet Take 1 tablet (20 mg total) by mouth daily. 90 tablet 1  . Polyvinyl Alcohol-Povidone (REFRESH OP) Place 1 drop into both eyes 2 (two) times daily.    . rosuvastatin (CRESTOR) 10 MG tablet Take 1 tablet (10 mg total) by mouth daily. 90 tablet 1  . zoledronic acid (RECLAST) 5 MG/100ML SOLN injection Inject 5 mg into the vein.     Social History   Socioeconomic History  . Marital status: Married    Spouse name: Not on file  . Number of children: Not on file  . Years of education: Not on file  . Highest education level: Not on file  Occupational History  . Not on file  Tobacco Use  . Smoking status: Never Smoker  . Smokeless tobacco: Never Used  Vaping Use  . Vaping Use: Never used  Substance and Sexual Activity  . Alcohol use: No    Alcohol/week: 0.0 standard drinks  . Drug use: No  . Sexual activity: Yes    Birth control/protection: Surgical  Other Topics Concern  . Not on file  Social History Narrative  . Not on file   Social Determinants of Health   Financial Resource Strain:   . Difficulty of Paying Living Expenses: Not on file  Food Insecurity:   . Worried About Charity fundraiser in the Last Year: Not on file  . Ran Out of Food in the Last Year: Not on file  Transportation Needs:   . Lack of Transportation (Medical): Not on file  . Lack of Transportation (Non-Medical): Not on file  Physical Activity:   . Days of Exercise per Week: Not on file  . Minutes of Exercise per Session: Not on file  Stress:   . Feeling of Stress : Not on file  Social Connections:   . Frequency of Communication with Friends and Family: Not on file  . Frequency of Social Gatherings with Friends and Family: Not on file  . Attends Religious Services: Not on file  . Active Member of Clubs or Organizations: Not on  file  . Attends Archivist Meetings: Not on file  . Marital Status: Not on file  Intimate Partner Violence:   . Fear of Current or Ex-Partner: Not on file  . Emotionally Abused: Not on file  . Physically Abused: Not on file  . Sexually Abused: Not on file   Family History  Problem Relation Age of Onset  . Cancer Brother   . Lupus Sister   . Hypertension Sister   . Aneurysm Sister        x4  OBJECTIVE:  Vitals:   12/12/19 1433  BP: 130/76  Pulse: 70  Resp: 16  Temp: 98.3 F (36.8 C)  SpO2: 98%     General appearance: alert; appears fatigued, but nontoxic; speaking in full sentences and tolerating own secretions HEENT: NCAT; Ears: EACs clear, TMs pearly gray; Eyes: PERRL.  EOM grossly intact. Sinuses: nontender; Nose: nares patent without rhinorrhea, Throat: oropharynx clear, tonsils non erythematous or enlarged, uvula midline  Neck: supple without LAD Lungs: unlabored respirations, symmetrical air entry; cough: None; no respiratory distress; CTAB Heart: regular rate and rhythm.  Radial pulses 2+ symmetrical bilaterally Skin: warm and dry Psychological: alert and cooperative; normal mood and affect  LABS:  No results found for this or any previous visit (from the past 24 hour(s)).   ASSESSMENT & PLAN:  1. Encounter for screening for COVID-19   2. Acute nonintractable headache, unspecified headache type   3. Body aches   4. Nausea without vomiting     Meds ordered this encounter  Medications  . ondansetron (ZOFRAN ODT) 4 MG disintegrating tablet    Sig: Take 1 tablet (4 mg total) by mouth every 8 (eight) hours as needed for nausea or vomiting.    Dispense:  20 tablet    Refill:  0    Discharge instructions   COVID testing ordered.  It will take between 2-7 days for test results.  Someone will contact you regarding abnormal results.    In the meantime: You should remain isolated in your home for 10 days from symptom onset AND greater than 24  hours after symptoms resolution (absence of fever without the use of fever-reducing medication and improvement in respiratory symptoms), whichever is longer Get plenty of rest and push fluids Zofran prescribed for nausea Use medications daily for symptom relief Use OTC medications like ibuprofen or tylenol as needed fever or pain Call or go to the ED if you have any new or worsening symptoms such as fever, worsening cough, shortness of breath, chest tightness, chest pain, turning blue, changes in mental status, etc...   Reviewed expectations re: course of current medical issues. Questions answered. Outlined signs and symptoms indicating need for more acute intervention. Patient verbalized understanding. After Visit Summary given.         Emerson Monte, [place] 12/12/19 1447

## 2019-12-12 NOTE — ED Triage Notes (Signed)
Patient states that she started feeling bad yesterday, chills, body aches, head aches, some nausea and fatigue.

## 2019-12-12 NOTE — Discharge Instructions (Signed)
COVID testing ordered.  It will take between 2-7 days for test results.  Someone will contact you regarding abnormal results.    In the meantime: You should remain isolated in your home for 10 days from symptom onset AND greater than 24 hours after symptoms resolution (absence of fever without the use of fever-reducing medication and improvement in respiratory symptoms), whichever is longer Get plenty of rest and push fluids Zofran prescribed for nausea Use medications daily for symptom relief Use OTC medications like ibuprofen or tylenol as needed fever or pain Call or go to the ED if you have any new or worsening symptoms such as fever, worsening cough, shortness of breath, chest tightness, chest pain, turning blue, changes in mental status, etc..Marland Kitchen

## 2019-12-13 ENCOUNTER — Ambulatory Visit (INDEPENDENT_AMBULATORY_CARE_PROVIDER_SITE_OTHER): Payer: BC Managed Care – PPO

## 2019-12-13 DIAGNOSIS — J309 Allergic rhinitis, unspecified: Secondary | ICD-10-CM

## 2019-12-14 LAB — SARS-COV-2, NAA 2 DAY TAT

## 2019-12-14 LAB — NOVEL CORONAVIRUS, NAA: SARS-CoV-2, NAA: NOT DETECTED

## 2019-12-17 ENCOUNTER — Other Ambulatory Visit: Payer: Self-pay | Admitting: Family Medicine

## 2019-12-17 DIAGNOSIS — Z1231 Encounter for screening mammogram for malignant neoplasm of breast: Secondary | ICD-10-CM

## 2019-12-18 ENCOUNTER — Ambulatory Visit: Payer: BC Managed Care – PPO

## 2019-12-25 ENCOUNTER — Telehealth: Payer: Self-pay

## 2019-12-25 DIAGNOSIS — R0609 Other forms of dyspnea: Secondary | ICD-10-CM

## 2019-12-25 DIAGNOSIS — R0600 Dyspnea, unspecified: Secondary | ICD-10-CM

## 2019-12-25 NOTE — Telephone Encounter (Signed)
-----   Message from Dorris Carnes V, MD sent at 12/10/2019 11:52 AM EST ----- Reviewed with pt echo, her hx and her symtpomss ON review, I would recomm a CT coronary angiogram to eval for significant CAD with dyspnea being an anginal equivalent in her

## 2019-12-25 NOTE — Telephone Encounter (Signed)
Order placed for cardiac CT, await insurance approval.

## 2019-12-31 ENCOUNTER — Encounter: Payer: Self-pay | Admitting: Family Medicine

## 2020-01-03 LAB — CMP14+EGFR
ALT: 22 IU/L (ref 0–32)
AST: 23 IU/L (ref 0–40)
Albumin/Globulin Ratio: 1.8 (ref 1.2–2.2)
Albumin: 4.5 g/dL (ref 3.8–4.8)
Alkaline Phosphatase: 95 IU/L (ref 44–121)
BUN/Creatinine Ratio: 14 (ref 12–28)
BUN: 11 mg/dL (ref 8–27)
Bilirubin Total: 0.4 mg/dL (ref 0.0–1.2)
CO2: 23 mmol/L (ref 20–29)
Calcium: 9.4 mg/dL (ref 8.7–10.3)
Chloride: 104 mmol/L (ref 96–106)
Creatinine, Ser: 0.79 mg/dL (ref 0.57–1.00)
GFR calc Af Amer: 92 mL/min/{1.73_m2} (ref >59)
GFR calc non Af Amer: 80 mL/min/{1.73_m2} (ref >59)
Globulin, Total: 2.5 g/dL (ref 1.5–4.5)
Glucose: 100 mg/dL — ABNORMAL HIGH (ref 65–99)
Potassium: 4.3 mmol/L (ref 3.5–5.2)
Sodium: 139 mmol/L (ref 134–144)
Total Protein: 7 g/dL (ref 6.0–8.5)

## 2020-01-03 LAB — CBC
Hematocrit: 39.4 % (ref 34.0–46.6)
Hemoglobin: 12.5 g/dL (ref 11.1–15.9)
MCH: 27.2 pg (ref 26.6–33.0)
MCHC: 31.7 g/dL (ref 31.5–35.7)
MCV: 86 fL (ref 79–97)
Platelets: 273 10*3/uL (ref 150–450)
RBC: 4.59 x10E6/uL (ref 3.77–5.28)
RDW: 13 % (ref 11.7–15.4)
WBC: 5.1 10*3/uL (ref 3.4–10.8)

## 2020-01-03 LAB — LIPID PANEL
Chol/HDL Ratio: 4 ratio (ref 0.0–4.4)
Cholesterol, Total: 206 mg/dL — ABNORMAL HIGH (ref 100–199)
HDL: 51 mg/dL (ref >39)
LDL Chol Calc (NIH): 146 mg/dL — ABNORMAL HIGH (ref 0–99)
Triglycerides: 48 mg/dL (ref 0–149)
VLDL Cholesterol Cal: 9 mg/dL (ref 5–40)

## 2020-01-03 LAB — VITAMIN D 25 HYDROXY (VIT D DEFICIENCY, FRACTURES): Vit D, 25-Hydroxy: 40.2 ng/mL (ref 30.0–100.0)

## 2020-01-03 LAB — TSH: TSH: 2.67 u[IU]/mL (ref 0.450–4.500)

## 2020-01-04 ENCOUNTER — Other Ambulatory Visit: Payer: Self-pay | Admitting: Allergy & Immunology

## 2020-01-07 ENCOUNTER — Other Ambulatory Visit: Payer: Self-pay

## 2020-01-07 ENCOUNTER — Encounter: Payer: Self-pay | Admitting: Family Medicine

## 2020-01-07 ENCOUNTER — Ambulatory Visit (INDEPENDENT_AMBULATORY_CARE_PROVIDER_SITE_OTHER): Payer: BC Managed Care – PPO | Admitting: Family Medicine

## 2020-01-07 VITALS — BP 112/70 | HR 82 | Resp 16 | Ht 66.0 in | Wt 152.0 lb

## 2020-01-07 DIAGNOSIS — Z0000 Encounter for general adult medical examination without abnormal findings: Secondary | ICD-10-CM | POA: Diagnosis not present

## 2020-01-07 DIAGNOSIS — Z9109 Other allergy status, other than to drugs and biological substances: Secondary | ICD-10-CM

## 2020-01-07 DIAGNOSIS — R7303 Prediabetes: Secondary | ICD-10-CM

## 2020-01-07 DIAGNOSIS — J45991 Cough variant asthma: Secondary | ICD-10-CM

## 2020-01-07 DIAGNOSIS — R351 Nocturia: Secondary | ICD-10-CM

## 2020-01-07 DIAGNOSIS — Z23 Encounter for immunization: Secondary | ICD-10-CM | POA: Diagnosis not present

## 2020-01-07 DIAGNOSIS — H547 Unspecified visual loss: Secondary | ICD-10-CM

## 2020-01-07 MED ORDER — IBUPROFEN 600 MG PO TABS
ORAL_TABLET | ORAL | 0 refills | Status: DC
Start: 2020-01-07 — End: 2021-04-26

## 2020-01-07 MED ORDER — ROSUVASTATIN CALCIUM 10 MG PO TABS: 10.0000 mg | ORAL_TABLET | Freq: Every day | ORAL | 1 refills | Status: DC

## 2020-01-07 MED ORDER — PENTOSAN POLYSULFATE SODIUM 100 MG PO CAPS: ORAL_CAPSULE | ORAL | 5 refills | Status: DC

## 2020-01-07 MED ORDER — ONDANSETRON HCL 4 MG PO TABS: 4.0000 mg | ORAL_TABLET | Freq: Three times a day (TID) | ORAL | 0 refills | Status: DC | PRN

## 2020-01-07 MED ORDER — FLUTICASONE FUROATE-VILANTEROL 100-25 MCG/INH IN AEPB: 1.0000 | INHALATION_SPRAY | Freq: Every day | RESPIRATORY_TRACT | 5 refills | Status: DC

## 2020-01-07 MED ORDER — PANTOPRAZOLE SODIUM 20 MG PO TBEC: 20.0000 mg | DELAYED_RELEASE_TABLET | Freq: Every day | ORAL | 1 refills | Status: DC

## 2020-01-07 NOTE — Progress Notes (Signed)
    Denise Werner     MRN: [id number]      DOB: 1955/11/07  HPI: Patient is in for annual physical exam. Nocturnal bladder spasm with frequency, normal amt of urine x 2 months Recent labs,  are reviewed. Immunization is reviewed , and  updated    PE: BP 112/70   Pulse 82   Resp 16   Ht 5\' 6"  (1.676 m)   Wt 152 lb 0.6 oz (69 kg)   SpO2 99%   BMI 24.54 kg/m   Pleasant  female, alert and oriented x 3, in no cardio-pulmonary distress. Afebrile. HEENT No facial trauma or asymetry. Sinuses non tender.  Extra occullar muscles intact.. External ears normal, . Neck: supple, no adenopathy,JVD or thyromegaly.No bruits.  Chest: Clear to ascultation bilaterally.No crackles or wheezes. Non tender to palpation  Breast: No exam, asymptomatic, mammogram to be schedule  Cardiovascular system; Heart sounds normal,  S1 and  S2 ,no S3.  No murmur, or thrill.   Abdomen: Soft, non tender      Musculoskeletal exam:  No deformity ,swelling or crepitus noted. No muscle wasting or atrophy.   Neurologic: Cranial nerves 2 to 12 intact. Power, tone ,sensation and reflexes normal throughout. No disturbance in gait. No tremor.  Skin: Intact, no ulceration, erythema , scaling or rash noted. Pigmentation normal throughout  Psych; Normal mood and affect. Judgement and concentration normal   Assessment & Plan:  Encounter for annual physical exam Annual exam as documented. Counseling done  re healthy lifestyle involving commitment to 150 minutes exercise per week, heart healthy diet, and attaining healthy weight.The importance of adequate sleep also discussed. Regular seat belt use and home safety, is also discussed. Changes in health habits are decided on by the patient with goals and time frames  set for achieving them. Immunization and cancer screening needs are specifically addressed at this visit.   Nocturia Increased symptoms x 2 months, trial of elmiron , if no success,  refer Urology  Cough variant asthma Uncontrolled , has no maintenance med, resume same, if persists , refer pulmonary  Environmental allergies On maintenance immunotherapy  Prediabetes Improved which is great Patient educated about the importance of limiting  Carbohydrate intake , the need to commit to daily physical activity for a minimum of 30 minutes , and to commit weight loss. The fact that changes in all these areas will reduce or eliminate all together the development of diabetes is stressed.   Diabetic Labs Latest Ref Rng & Units 01/02/2020 08/30/2019 08/19/2019 12/14/2018 08/10/2018  HbA1c 4.8 - 5.6 % 5.5 - - 5.8(H) -  Chol 100 - 199 mg/dL 206(H) 215(H) - 152 -  HDL >39 mg/dL 51 59 - 51 -  Calc LDL 0 - 99 mg/dL 146(H) 141(H) - 89 -  Triglycerides 0 - 149 mg/dL 48 62 - 41 -  Creatinine 0.57 - 1.00 mg/dL 0.79 0.73 0.70 0.80 0.85   BP/Weight 01/08/2020 01/07/2020 12/12/2019 11/15/2019 10/06/2019 09/02/2019 2/40/9735  Systolic BP - 329 924 268 341 962 229  Diastolic BP - 70 76 84 86 69 61  Wt. (Lbs) 154.4 152.04 - 155.6 - 159 147  BMI 25.11 24.54 - 25.11 - 25.66 24.46   No flowsheet data found.

## 2020-01-07 NOTE — Patient Instructions (Signed)
F/U in 6 months, call if you need me sooner  HBA1C to be added to lab, if unable please  Do glycoHB in office  Flu vaccine today  Elmiron for bladder spasm is prescribed , if not helping please send message in next 4 to  8 weeks for referral to Urology  Memory Dance is prescribed, you will be  referred to Pulmonary in [place] for evaluation of increasing shortness of breath and uncontrolled cough if your complete heart evaluation has no answer for this problem. PLEASE KEEP ME UPDATED   YOU are referred for eye exam, left vision is cocerning  Please NEVER run out of maintenance meds  It is important that you exercise regularly at least 30 minutes 5 times a week. If you develop chest pain, have severe difficulty breathing, or feel very tired, stop exercising immediately and seek medical attention

## 2020-01-08 ENCOUNTER — Ambulatory Visit (INDEPENDENT_AMBULATORY_CARE_PROVIDER_SITE_OTHER): Payer: BC Managed Care – PPO | Admitting: Allergy & Immunology

## 2020-01-08 ENCOUNTER — Encounter: Payer: Self-pay | Admitting: Allergy & Immunology

## 2020-01-08 VITALS — HR 75 | Temp 97.6°F | Resp 18 | Ht 65.75 in | Wt 154.4 lb

## 2020-01-08 DIAGNOSIS — J302 Other seasonal allergic rhinitis: Secondary | ICD-10-CM | POA: Diagnosis not present

## 2020-01-08 DIAGNOSIS — R0602 Shortness of breath: Secondary | ICD-10-CM | POA: Diagnosis not present

## 2020-01-08 DIAGNOSIS — J4540 Moderate persistent asthma, uncomplicated: Secondary | ICD-10-CM | POA: Diagnosis not present

## 2020-01-08 DIAGNOSIS — J3089 Other allergic rhinitis: Secondary | ICD-10-CM

## 2020-01-08 NOTE — Progress Notes (Signed)
FOLLOW UP  Date of Service/Encounter:  01/08/20   Assessment:   Moderate persistent asthma, uncomplicated  Seasonal and perennial allergic rhinitis(trees, weeds, grasses, indoor molds, outdoor molds, dust mites, cat and cockroach)- with worsening symptoms since being off of the allergy shots  Bilateral conjunctivitis with xerophthalmia - diagnosed recently with clogged tear ducts bilaterally  Shortness of breath - full cardiac work-up pending   At this point in time, it does not seem that her shortness of breath is related to her asthma.  Her spirometry is completely stellar.  She is going to use her controller medication on a daily basis.  We provided her with a sample of Trelegy 100 mcg to use 1 puff once daily.  She is going to let us know how she feels when she is compliant with it.  She has enough medication to last for 2 weeks and she will give [place] an update each time she comes in for allergy shot.  If this is not working, I can almost guarantee that her shortness of breath is not related to her asthma.  Plan/Recommendations:   1. Moderate persistent asthma, uncomplicated - Lung testing looked stable today. - I am not going to know whether the shortness of breath is related to your asthma until you take your controller medication on a DAILY basis. - We are stopping the Breo and starting Trelegy one puff once daily (contains Breo plus another medication to help keep your lungs open) - Daily controller medication(s): Trelegy 119mcg one puff once daily - Prior to physical activity: ProAir 2 puffs 10-15 minutes before physical activity. - Rescue medications: Xopenex nebulizer every 4-6 hours as needed or ProAir 4 puffs every 4-6 hours as needed - Asthma control goals:  * Full participation in all desired activities (may need albuterol before activity) * Albuterol use two time or less a week on average (not counting use with activity) * Cough interfering with sleep two time or  less a month * Oral steroids no more than once a year * No hospitalizations  2. Seasonal and perennial allergic rhinitis (trees, weeds, grasses, indoor molds, outdoor molds, dust mites, cat and cockroach) - Continue with allergy shots at the same schedule.  - Continue with Xhance two sprays per nostril up to twice daily. - Continue with your antihistamine daily.  - Consider nasal saline rinses 1-2 times daily to remove allergens from the nasal cavities as well as help with mucous clearance (this is especially helpful to do before the nasal sprays are given)  3. Return in about 6 months (around 07/08/2020).   Subjective:   Denise Werner is a 64 y.o. female presenting today for follow up of  Chief Complaint  Patient presents with  . Follow-up    Denise Werner has a history of the following: Patient Active Problem List   Diagnosis Date Noted  . Nocturia 01/07/2020  . Pain and swelling of knee, right 09/03/2019  . Bilateral swelling of feet and ankles 09/03/2019  . Fatigue 09/03/2019  . Overweight (BMI 25.0-29.9) 09/03/2019  . Left elbow pain 12/17/2018  . Left forearm pain 12/17/2018  . Seasonal and perennial allergic rhinitis 03/08/2018  . 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 Back pain with right-sided radiculopathy 07/08/2013  . GERD (gastroesophageal reflux disease) 12/18/2012  . HTN (hypertension) 09/13/2011  . Hemorrhoid 10/06/2010  . Osteopenia 09/20/2010  . GANGLION CYST 03/17/2010  . Prediabetes 10/08/2009  . Environmental allergies 01/11/2008  . Cough variant asthma 01/11/2008  . Insomnia  09/16/2007  . Hyperlipidemia LDL goal <100 08/16/2007    History obtained from: chart review and patient.  Denise Werner is a 64 y.o. female presenting for a follow up visit.  She was last seen in August 2020.  At that time, we continued her on Protonix daily as well as Breo 1 puff once daily.  She also has ProAir to use during respiratory flares.  We recommended that she restart her  allergy shots.  We will continue with XHANCE 2 sprays per nostril twice daily and Zyrtec 10 mg daily.  We gave her samples of Allegra use.  She did have conjunctivitis and already had an appointment with ophthalmologist.  Since last visit, she has mostly done well.    Asthma/Respiratory Symptom History: She remains on the Breo 1 puff once daily, but she is not very compliant with it.  She does not think that it is a minor issue, as she only made about $30 for the [facility].  However, she does not like to use medications on a routine basis.  We had talked about this multiple times in the past, but she is willing to try to take it on a more regular basis to see if it helps.  She has been having some shortness of breath with physical activity recently.  Has been going on for 2 months.  She actually has had a fairly thorough cardiac work-up including a normal EKG and a normal echocardiogram.  Her cardiologist is Dr. Levonne Spiller.  During these episodes of shortness of breath, which occurred during her activities of daily living, she has not tried using albuterol to see if it helps.  She will do this in the future.  She denies any swelling or cough.  She has had no fever.  She remains on the Protonix for her GERD.  Allergic Rhinitis Symptom History: She remains on her allergen immunotherapy.  These have helped to decrease her allergic rhinitis symptoms.  She is not using any of her medications on a routine basis.  She does report some left eye dryness, which was worked up in the past.  Autoimmune work-up has been negative.   She has been fairly stressed out at home.  Her husband recently tore a tendon in his left knee.  She has been doing a lot of at home nursing to help him along.  He has made a good recovery.  She also hosted several people for Thanksgiving, this is despite the fact that her husband was discharged from the hospital shortly before Thanksgiving.  She shows me a picture of all of the family gathered  around her husband, who is in bed.  Thankfully, her daughters help her recover.  Otherwise, there have been no changes to her past medical history, surgical history, family history, or social history.    Review of Systems  Constitutional: Negative.  Negative for chills, fever, malaise/fatigue and weight loss.  HENT: Negative.  Negative for congestion, ear discharge and ear pain.   Eyes: Negative for pain, discharge and redness.  Respiratory: Positive for shortness of breath. Negative for cough, sputum production and wheezing.   Cardiovascular: Negative.  Negative for chest pain and palpitations.  Gastrointestinal: Negative for abdominal pain, heartburn, nausea and vomiting.  Skin: Negative.  Negative for itching and rash.  Neurological: Negative for dizziness and headaches.  Endo/Heme/Allergies: Negative for environmental allergies. Does not bruise/bleed easily.       Objective:   Pulse 75, temperature 97.6 F (36.4 C), temperature source Temporal, resp.  rate 18, height 5' 5.75" (1.67 m), weight 154 lb 6.4 oz (70 kg), SpO2 100 %. Body mass index is 25.11 kg/m.   Physical Exam:  Physical Exam Constitutional:      Appearance: She is well-developed.     Comments: Adorable.  HENT:     Head: Normocephalic and atraumatic.     Right Ear: Tympanic membrane, ear canal and external ear normal.     Left Ear: Tympanic membrane, ear canal and external ear normal.     Nose: No nasal deformity, septal deviation, mucosal edema or rhinorrhea.     Right Turbinates: Enlarged and swollen.     Left Turbinates: Enlarged and swollen.     Right Sinus: No maxillary sinus tenderness or frontal sinus tenderness.     Left Sinus: No maxillary sinus tenderness or frontal sinus tenderness.     Mouth/Throat:     Mouth: Mucous membranes are not pale and not dry.     Pharynx: Uvula midline.  Eyes:     General:        Right eye: No discharge.        Left eye: No discharge.     Conjunctiva/sclera:  Conjunctivae normal.     Right eye: Right conjunctiva is not injected. No chemosis.    Left eye: Left conjunctiva is not injected. No chemosis.    Pupils: Pupils are equal, round, and reactive to light.  Cardiovascular:     Rate and Rhythm: Normal rate and regular rhythm.     Heart sounds: Normal heart sounds.  Pulmonary:     Effort: Pulmonary effort is normal. No tachypnea, accessory muscle usage or respiratory distress.     Breath sounds: Normal breath sounds. No wheezing, rhonchi or rales.     Comments: Moving air well in all lung fields. No increased work of breathing noted.  Chest:     Chest wall: No tenderness.  Lymphadenopathy:     Cervical: No cervical adenopathy.  Skin:    General: Skin is warm.     Capillary Refill: Capillary refill takes less than 2 seconds.     Coloration: Skin is not pale.     Findings: No abrasion, erythema, petechiae or rash. Rash is not papular, urticarial or vesicular.     Comments: No eczematous or urticarial lesions noted.   Neurological:     Mental Status: She is alert.  Psychiatric:        Behavior: Behavior is cooperative.      Diagnostic studies:    Spirometry: results normal (FEV1: 2.30/110%, FVC: 2.69/100%, FEV1/FVC: 86%).    Spirometry consistent with normal pattern.   Allergy Studies: none     [name], MD  [facility]

## 2020-01-08 NOTE — Patient Instructions (Addendum)
1. Moderate persistent asthma, uncomplicated - Lung testing looked stable today. - I am not going to know whether the shortness of breath is related to your asthma until you take your controller medication on a DAILY basis. - We are stopping the Breo and starting Trelegy one puff once daily (contains Breo plus another medication to help keep your lungs open) - Daily controller medication(s): Trelegy 175mcg one puff once daily - Prior to physical activity: ProAir 2 puffs 10-15 minutes before physical activity. - Rescue medications: Xopenex nebulizer every 4-6 hours as needed or ProAir 4 puffs every 4-6 hours as needed - Asthma control goals:  * Full participation in all desired activities (may need albuterol before activity) * Albuterol use two time or less a week on average (not counting use with activity) * Cough interfering with sleep two time or less a month * Oral steroids no more than once a year * No hospitalizations  2. Seasonal and perennial allergic rhinitis (trees, weeds, grasses, indoor molds, outdoor molds, dust mites, cat and cockroach) - Continue with allergy shots at the same schedule.  - Continue with Xhance two sprays per nostril up to twice daily. - Continue with your antihistamine daily.  - Consider nasal saline rinses 1-2 times daily to remove allergens from the nasal cavities as well as help with mucous clearance (this is especially helpful to do before the nasal sprays are given)  3. Return in about 6 months (around 07/08/2020).    Please inform us of any Emergency Department visits, hospitalizations, or changes in symptoms. Call us before going to the ED for breathing or allergy symptoms since we might be able to fit you in for a sick visit. Feel free to contact us anytime with any questions, problems, or concerns.  It was a pleasure to see you again today!  Websites that have reliable patient information: 1. American Academy of Asthma, Allergy, and Immunology:  www.aaaai.org 2. Food Allergy Research and Education (FARE): foodallergy.org 3. Mothers of Asthmatics: http://www.asthmacommunitynetwork.org 4. American College of Allergy, Asthma, and Immunology: www.acaai.org   COVID-19 Vaccine Information can be found at: ShippingScam.co.uk For questions related to vaccine distribution or appointments, please email [email or web address] or call [phone].     "Like" [place] on Facebook and Instagram for our latest updates!     HAPPY FALL!     Make sure you are registered to vote! If you have moved or changed any of your contact information, you will need to get this updated before voting!  In some cases, you MAY be able to register to vote online: [email or web address]

## 2020-01-09 ENCOUNTER — Encounter: Payer: Self-pay | Admitting: Family Medicine

## 2020-01-12 DIAGNOSIS — Z0000 Encounter for general adult medical examination without abnormal findings: Secondary | ICD-10-CM | POA: Insufficient documentation

## 2020-01-12 NOTE — Assessment & Plan Note (Signed)
Increased symptoms x 2 months, trial of elmiron , if no success, refer Urology

## 2020-01-12 NOTE — Assessment & Plan Note (Signed)
Uncontrolled , has no maintenance med, resume same, if persists , refer pulmonary

## 2020-01-12 NOTE — Assessment & Plan Note (Signed)
On maintenance immunotherapy

## 2020-01-12 NOTE — Assessment & Plan Note (Signed)

## 2020-01-12 NOTE — Assessment & Plan Note (Signed)
Improved which is great Patient educated about the importance of limiting  Carbohydrate intake , the need to commit to daily physical activity for a minimum of 30 minutes , and to commit weight loss. The fact that changes in all these areas will reduce or eliminate all together the development of diabetes is stressed.   Diabetic Labs Latest Ref Rng & Units 01/02/2020 08/30/2019 08/19/2019 12/14/2018 08/10/2018  HbA1c 4.8 - 5.6 % 5.5 - - 5.8(H) -  Chol 100 - 199 mg/dL 206(H) 215(H) - 152 -  HDL >39 mg/dL 51 59 - 51 -  Calc LDL 0 - 99 mg/dL 146(H) 141(H) - 89 -  Triglycerides 0 - 149 mg/dL 48 62 - 41 -  Creatinine 0.57 - 1.00 mg/dL 0.79 0.73 0.70 0.80 0.85   BP/Weight 01/08/2020 01/07/2020 12/12/2019 11/15/2019 10/06/2019 09/02/2019 09/10/8865  Systolic BP - 737 366 815 947 076 151  Diastolic BP - 70 76 84 86 69 61  Wt. (Lbs) 154.4 152.04 - 155.6 - 159 147  BMI 25.11 24.54 - 25.11 - 25.66 24.46   No flowsheet data found.

## 2020-01-15 ENCOUNTER — Ambulatory Visit (INDEPENDENT_AMBULATORY_CARE_PROVIDER_SITE_OTHER): Payer: BC Managed Care – PPO

## 2020-01-15 DIAGNOSIS — J309 Allergic rhinitis, unspecified: Secondary | ICD-10-CM

## 2020-01-22 ENCOUNTER — Ambulatory Visit (INDEPENDENT_AMBULATORY_CARE_PROVIDER_SITE_OTHER): Payer: BC Managed Care – PPO

## 2020-01-22 DIAGNOSIS — J309 Allergic rhinitis, unspecified: Secondary | ICD-10-CM

## 2020-01-26 ENCOUNTER — Other Ambulatory Visit: Payer: Self-pay | Admitting: Family Medicine

## 2020-01-27 ENCOUNTER — Encounter: Payer: Self-pay | Admitting: Family Medicine

## 2020-01-27 ENCOUNTER — Other Ambulatory Visit: Payer: Self-pay | Admitting: Family Medicine

## 2020-01-27 LAB — HGB A1C W/O EAG: Hgb A1c MFr Bld: 5.5 % (ref 4.8–5.6)

## 2020-01-27 LAB — SPECIMEN STATUS REPORT

## 2020-01-29 ENCOUNTER — Ambulatory Visit: Payer: BC Managed Care – PPO

## 2020-02-07 ENCOUNTER — Ambulatory Visit (INDEPENDENT_AMBULATORY_CARE_PROVIDER_SITE_OTHER): Payer: BC Managed Care – PPO

## 2020-02-07 DIAGNOSIS — J309 Allergic rhinitis, unspecified: Secondary | ICD-10-CM | POA: Diagnosis not present

## 2020-02-14 ENCOUNTER — Ambulatory Visit (INDEPENDENT_AMBULATORY_CARE_PROVIDER_SITE_OTHER): Payer: BC Managed Care – PPO

## 2020-02-14 DIAGNOSIS — J309 Allergic rhinitis, unspecified: Secondary | ICD-10-CM

## 2020-03-03 ENCOUNTER — Ambulatory Visit
Admission: RE | Admit: 2020-03-03 | Discharge: 2020-03-03 | Disposition: A | Discharge status: Home / Self Care | Payer: Self-pay | Source: Ambulatory Visit | Attending: Family Medicine | Admitting: Family Medicine

## 2020-03-03 ENCOUNTER — Ambulatory Visit: Payer: BC Managed Care – PPO

## 2020-03-03 ENCOUNTER — Other Ambulatory Visit: Payer: Self-pay

## 2020-03-03 DIAGNOSIS — Z1231 Encounter for screening mammogram for malignant neoplasm of breast: Secondary | ICD-10-CM

## 2020-03-04 ENCOUNTER — Ambulatory Visit (INDEPENDENT_AMBULATORY_CARE_PROVIDER_SITE_OTHER): Payer: BC Managed Care – PPO

## 2020-03-04 DIAGNOSIS — J309 Allergic rhinitis, unspecified: Secondary | ICD-10-CM | POA: Diagnosis not present

## 2020-03-04 MED ORDER — TRELEGY ELLIPTA 100-62.5-25 MCG/INH IN AEPB: 1.0000 | INHALATION_SPRAY | Freq: Every day | RESPIRATORY_TRACT | 5 refills | Status: DC

## 2020-03-13 ENCOUNTER — Ambulatory Visit (INDEPENDENT_AMBULATORY_CARE_PROVIDER_SITE_OTHER): Payer: BC Managed Care – PPO

## 2020-03-13 DIAGNOSIS — J309 Allergic rhinitis, unspecified: Secondary | ICD-10-CM

## 2020-03-16 ENCOUNTER — Other Ambulatory Visit (HOSPITAL_COMMUNITY)
Admission: RE | Admit: 2020-03-16 | Discharge: 2020-03-16 | Disposition: A | Discharge status: Home / Self Care | Payer: BC Managed Care – PPO | Source: Ambulatory Visit | Attending: Internal Medicine | Admitting: Internal Medicine

## 2020-03-16 ENCOUNTER — Telehealth (HOSPITAL_COMMUNITY): Payer: Self-pay | Admitting: *Deleted

## 2020-03-16 ENCOUNTER — Other Ambulatory Visit (HOSPITAL_COMMUNITY): Payer: Self-pay | Admitting: *Deleted

## 2020-03-16 DIAGNOSIS — R0600 Dyspnea, unspecified: Secondary | ICD-10-CM | POA: Insufficient documentation

## 2020-03-16 DIAGNOSIS — R0609 Other forms of dyspnea: Secondary | ICD-10-CM

## 2020-03-16 LAB — BASIC METABOLIC PANEL
Anion gap: 5 (ref 5–15)
BUN: 11 mg/dL (ref 8–23)
CO2: 28 mmol/L (ref 22–32)
Calcium: 9.1 mg/dL (ref 8.9–10.3)
Chloride: 105 mmol/L (ref 98–111)
Creatinine, Ser: 0.84 mg/dL (ref 0.44–1.00)
GFR, Estimated: >60 mL/min (ref >60)
Glucose, Bld: 107 mg/dL — ABNORMAL HIGH (ref 70–99)
Potassium: 3.9 mmol/L (ref 3.5–5.1)
Sodium: 138 mmol/L (ref 135–145)

## 2020-03-16 NOTE — Telephone Encounter (Signed)
Reaching out to patient to offer assistance regarding upcoming cardiac imaging study; pt verbalizes understanding of appt date/time, parking situation and where to check in, pre-test NPO status, and verified current allergies; name and call back number provided for further questions should they arise  Gordy Clement RN [facility] [phone] office [phone] cell  Pt states she will go have labs drawn today.

## 2020-03-17 ENCOUNTER — Ambulatory Visit (HOSPITAL_COMMUNITY): Payer: BC Managed Care – PPO

## 2020-03-17 NOTE — Progress Notes (Signed)
Patient needs dose of metoprolol called in for CT on Wed 2/16   Exam at noon Coronary CTA order set calls for one time dose   Please call in for pick up prior

## 2020-03-17 NOTE — Progress Notes (Signed)
Please confirm Rx for metoprolol called in   Pt with CT coronary angio onWed 2/16

## 2020-03-17 NOTE — Progress Notes (Signed)
Please see note re b blocker Rx

## 2020-03-18 ENCOUNTER — Telehealth: Payer: Self-pay

## 2020-03-18 ENCOUNTER — Other Ambulatory Visit: Payer: Self-pay

## 2020-03-18 ENCOUNTER — Ambulatory Visit (HOSPITAL_COMMUNITY)
Admission: RE | Admit: 2020-03-18 | Discharge: 2020-03-18 | Disposition: A | Discharge status: Home / Self Care | Payer: BC Managed Care – PPO | Source: Ambulatory Visit | Attending: Internal Medicine | Admitting: Internal Medicine

## 2020-03-18 DIAGNOSIS — R0609 Other forms of dyspnea: Secondary | ICD-10-CM | POA: Diagnosis not present

## 2020-03-18 DIAGNOSIS — R0600 Dyspnea, unspecified: Secondary | ICD-10-CM | POA: Diagnosis present

## 2020-03-18 DIAGNOSIS — Z006 Encounter for examination for normal comparison and control in clinical research program: Secondary | ICD-10-CM

## 2020-03-18 MED ORDER — NITROGLYCERIN 0.4 MG SL SUBL: 0.8000 mg | SUBLINGUAL_TABLET | Freq: Once | SUBLINGUAL | Status: DC

## 2020-03-18 MED ORDER — IOHEXOL 350 MG/ML SOLN
80.0000 mL | Freq: Once | INTRAVENOUS | Status: AC | PRN
  Administered 2020-03-18: 80 mL via INTRAVENOUS

## 2020-03-18 MED ORDER — NITROGLYCERIN 0.4 MG SL SUBL
SUBLINGUAL_TABLET | SUBLINGUAL | Status: AC
  Filled 2020-03-18: qty 2

## 2020-03-18 MED ORDER — METOPROLOL TARTRATE 100 MG PO TABS: 100.0000 mg | ORAL_TABLET | Freq: Once | ORAL | 0 refills | Status: DC

## 2020-03-18 NOTE — Progress Notes (Signed)
Called patient and advised her to take 100 mg Metoprolol prior to CTA. Patient was unaware that she needed to take anything medication prior. Apologized for the miscommunication. Patient will now go pick up her Rx that was sent in by this RN per Dr. Harrington Challenger' request. Patient has CTA scheduled in less than two hours, advised patient to take the Metoprolol as soon as she gets it from her pharmacy.   Called the patients pharmacy and requested this be dispensed STAT as time is of the essence. Pharmacist states they will expedite process.

## 2020-03-18 NOTE — Telephone Encounter (Signed)
See 2/16 phone note

## 2020-03-18 NOTE — Research (Signed)
IDENTIFY Informed Consent                  Subject Name: Denise Werner   Subject met inclusion and exclusion criteria.  The informed consent form, study requirements and expectations were reviewed with the subject and questions and concerns were addressed prior to the signing of the consent form.  The subject verbalized understanding of the trial requirements.  The subject agreed to participate in the IDENTIFY trial and signed the informed consent at 1157AM on 03/18/20.  The informed consent was obtained prior to performance of any protocol-specific procedures for the subject.  A copy of the signed informed consent was given to the subject and a copy was placed in the subject's medical record.   Meade Maw, Naval architect

## 2020-04-03 ENCOUNTER — Ambulatory Visit (INDEPENDENT_AMBULATORY_CARE_PROVIDER_SITE_OTHER): Payer: BC Managed Care – PPO

## 2020-04-03 DIAGNOSIS — J309 Allergic rhinitis, unspecified: Secondary | ICD-10-CM | POA: Diagnosis not present

## 2020-04-17 ENCOUNTER — Ambulatory Visit (INDEPENDENT_AMBULATORY_CARE_PROVIDER_SITE_OTHER): Payer: BC Managed Care – PPO

## 2020-04-17 DIAGNOSIS — J309 Allergic rhinitis, unspecified: Secondary | ICD-10-CM

## 2020-04-24 ENCOUNTER — Ambulatory Visit (INDEPENDENT_AMBULATORY_CARE_PROVIDER_SITE_OTHER): Payer: BC Managed Care – PPO

## 2020-04-24 DIAGNOSIS — J309 Allergic rhinitis, unspecified: Secondary | ICD-10-CM

## 2020-04-27 ENCOUNTER — Other Ambulatory Visit: Payer: Self-pay | Admitting: Family Medicine

## 2020-05-01 ENCOUNTER — Ambulatory Visit (INDEPENDENT_AMBULATORY_CARE_PROVIDER_SITE_OTHER): Payer: BC Managed Care – PPO

## 2020-05-01 DIAGNOSIS — J309 Allergic rhinitis, unspecified: Secondary | ICD-10-CM | POA: Diagnosis not present

## 2020-05-22 ENCOUNTER — Ambulatory Visit: Payer: BC Managed Care – PPO | Admitting: Family Medicine

## 2020-05-22 ENCOUNTER — Other Ambulatory Visit: Payer: Self-pay

## 2020-05-22 ENCOUNTER — Encounter: Payer: Self-pay | Admitting: Family Medicine

## 2020-05-22 VITALS — BP 122/88 | HR 108 | Temp 98.1°F | Resp 16 | Ht 65.0 in

## 2020-05-22 DIAGNOSIS — J302 Other seasonal allergic rhinitis: Secondary | ICD-10-CM

## 2020-05-22 DIAGNOSIS — Z9189 Other specified personal risk factors, not elsewhere classified: Secondary | ICD-10-CM | POA: Insufficient documentation

## 2020-05-22 DIAGNOSIS — K219 Gastro-esophageal reflux disease without esophagitis: Secondary | ICD-10-CM

## 2020-05-22 DIAGNOSIS — J3089 Other allergic rhinitis: Secondary | ICD-10-CM

## 2020-05-22 DIAGNOSIS — R0602 Shortness of breath: Secondary | ICD-10-CM | POA: Diagnosis not present

## 2020-05-22 DIAGNOSIS — J0100 Acute maxillary sinusitis, unspecified: Secondary | ICD-10-CM

## 2020-05-22 MED ORDER — XHANCE 93 MCG/ACT NA EXHU: 2.0000 | INHALANT_SUSPENSION | Freq: Two times a day (BID) | NASAL | 11 refills | Status: DC

## 2020-05-22 MED ORDER — MONTELUKAST SODIUM 10 MG PO TABS: ORAL_TABLET | ORAL | 1 refills | Status: DC

## 2020-05-22 MED ORDER — METHYLPREDNISOLONE ACETATE 40 MG/ML IJ SUSP
40.0000 mg | Freq: Once | INTRAMUSCULAR | Status: AC
  Administered 2020-05-22: 40 mg via INTRAMUSCULAR

## 2020-05-22 MED ORDER — CETIRIZINE HCL 10 MG PO TABS: 10.0000 mg | ORAL_TABLET | Freq: Every day | ORAL | 5 refills | Status: DC

## 2020-05-22 MED ORDER — PANTOPRAZOLE SODIUM 20 MG PO TBEC: 20.0000 mg | DELAYED_RELEASE_TABLET | Freq: Every day | ORAL | 2 refills | Status: DC

## 2020-05-22 MED ORDER — ALBUTEROL SULFATE HFA 108 (90 BASE) MCG/ACT IN AERS: 2.0000 | INHALATION_SPRAY | Freq: Four times a day (QID) | RESPIRATORY_TRACT | 1 refills | Status: DC | PRN

## 2020-05-22 MED ORDER — TRELEGY ELLIPTA 100-62.5-25 MCG/INH IN AEPB: 1.0000 | INHALATION_SPRAY | Freq: Every day | RESPIRATORY_TRACT | 5 refills | Status: DC

## 2020-05-22 NOTE — Patient Instructions (Addendum)
Acute sinusitis Depo-Medrol 40 given in the clinic Begin nasal saline rinses twice a day or more frequently if needed Begin Mucinex 600mg  to 1200 mg twice a day and increase fluid intake in order to thin out mucus Call the clinic for any worsening of symptoms or if you develop a fever  Allergic rhintiis Continue Xhance nasal spray 2 sprays in each nostril twice a day Stop any antihistamines for about 1 week. Then you may begin an antihistamine once a day as needed for a runny nose or itch Continue montelukast 10 mg once a day for now. Consider saline nasal rinses as needed for nasal symptoms. Use this before any medicated nasal sprays for best result  Shortness of breath Begin Trelegy 100-1 puff once a day to prevent cough or wheeze Continue albuterol 2 puffs every 4 hours as needed for cough or wheeze OR Instead use albuterol 0.083% solution via nebulizer one unit vial every 4 hours as needed for cough or wheeze  Reflux Continue dietary and lifestyle modifications as listed below Continue pantoprazole 20 mg once a day to control reflux  COVID exposure Your rapid COVID test was negative at today's visit Continue to monitor for any syumptoms  Call the clinic if this treatment plan is not working well for you  Follow up in 2 months or sooner if needed.

## 2020-05-22 NOTE — Progress Notes (Signed)
[place], [place] [address] Dept: [phone]  FOLLOW UP NOTE  Patient ID: Denise Werner, female    DOB: 19-Jul-1955  Age: 65 y.o. MRN: [id number] Date of Office Visit: 05/22/2020  Assessment  Chief Complaint: Cough (SOB took 2 prednisone this morning for the cough ) and Allergic Rhinitis  (Sinus pressure, coughing green mucus, headaches, had a fever/chills (Tuesday) )  HPI Denise Werner is a 65 year old female who presents to the clinic for acute evaluation of shortness of breath and sinus issues.  She was last seen in this clinic on 01/08/2020 by Dr. Ernst Bowler for evaluation of asthma, allergic rhinitis on allergen immunotherapy.  At today's visit she reports that on Monday she received a Materna booster and began to feel symptoms including cough. On Tuesday, she reports that she began to experience fever and chills which resolved later that day.  She reports her asthma is moderately well controlled with no shortness of breath or wheeze with activity or rest.  She does report cough beginning on Monday night which is producing a clear to green and back to clear mucus.  She continues Trelegy 100 about 2 days a week and has used her albuterol infrequently with relief of symptoms. Allergic rhinitis is reported as well controlled until Monday night when she reports that she began to experience nasal congestion, nasal drainage ranging in color from clear to green and back to clear, headache, sinus pressure around her eyes, thick post nasal drainage, and scratchy throat. She continues Xhance nasal spray and is not currently taking an antihistamine or using nasal saline rinses. She reports reflux is pretty well controlled with pantoprazole 20 mg once a day and has not experienced any symptoms of reflux recently. Allergic conjunctivitis is reported as well controlled with no medical intervention at this time. She has received 2 Moderna COVID vaccines and one Moderna booster  vaccine. Her current medications listed in the chart.    Drug Allergies:  Allergies  Allergen Reactions  . Hydrocodone Nausea And Vomiting and Nausea Only    Also causes dizziness and states feels like she has an outer body experience. Also causes dizziness and states feels like she has an outer body experience.  . Other Rash  . Sulfonamide Derivatives Rash    Physical Exam: BP 122/88   Pulse (!) 108   Temp 98.1 F (36.7 C)   Resp 16   Ht 5\' 5"  (1.651 m)   SpO2 99%   BMI 25.69 kg/m    Physical Exam Vitals reviewed.  Constitutional:      Appearance: Normal appearance.  HENT:     Head: Normocephalic and atraumatic.     Right Ear: Tympanic membrane normal.     Left Ear: Tympanic membrane normal.     Nose:     Comments: Bilateral nares erythematous with clear nasal drainage.  Pharynx is slightly erythematous with no exudate.  Ears normal.  Eyes normal. Eyes:     Conjunctiva/sclera: Conjunctivae normal.  Cardiovascular:     Rate and Rhythm: Regular rhythm. Tachycardia present.     Heart sounds: Normal heart sounds. No murmur heard.   Pulmonary:     Effort: Pulmonary effort is normal.     Breath sounds: Normal breath sounds.     Comments: Lungs clear to auscultation Musculoskeletal:        General: Normal range of motion.     Cervical back: Normal range of motion and neck supple.  Skin:  General: Skin is warm and dry.  Neurological:     Mental Status: She is alert and oriented to person, place, and time.  Psychiatric:        Mood and Affect: Mood normal.        Behavior: Behavior normal.        Thought Content: Thought content normal.        Judgment: Judgment normal.     Diagnostics: Spirometry attempted, however, the testing did not meet guidelines.  Assessment and Plan: 1. SOB (shortness of breath)   2. Seasonal and perennial allergic rhinitis   3. Acute non-recurrent maxillary sinusitis   4. Gastroesophageal reflux disease, unspecified whether  esophagitis present   5. At increased risk of exposure to COVID-19 virus     Meds ordered this encounter  Medications  . albuterol (VENTOLIN HFA) 108 (90 Base) MCG/ACT inhaler    Sig: Inhale 2 puffs into the lungs every 6 (six) hours as needed for wheezing or shortness of breath.    Dispense:  18 g    Refill:  1  . Fluticasone-Umeclidin-Vilant (TRELEGY ELLIPTA) 100-62.5-25 MCG/INH AEPB    Sig: Inhale 1 puff into the lungs daily.    Dispense:  28 each    Refill:  5  . montelukast (SINGULAIR) 10 MG tablet    Sig: TAKE 1 TABLET BY MOUTH EVERYDAY AT BEDTIME    Dispense:  90 tablet    Refill:  1  . Fluticasone Propionate (XHANCE) 93 MCG/ACT EXHU    Sig: Place 2 puffs into the nose 2 (two) times daily.    Dispense:  32 mL    Refill:  11    Please call [phone] for delivery.  . methylPREDNISolone acetate (DEPO-MEDROL) injection 40 mg  . cetirizine (ZYRTEC) 10 MG tablet    Sig: Take 1 tablet (10 mg total) by mouth daily.    Dispense:  30 tablet    Refill:  5  . pantoprazole (PROTONIX) 20 MG tablet    Sig: Take 1 tablet (20 mg total) by mouth daily.    Dispense:  30 tablet    Refill:  2    Patient Instructions  Acute sinusitis Depo-Medrol 40 given in the clinic Begin nasal saline rinses twice a day or more frequently if needed Begin Mucinex 600mg  to 1200 mg twice a day and increase fluid intake in order to thin out mucus Call the clinic for any worsening of symptoms or if you develop a fever  Allergic rhintiis Continue Xhance nasal spray 2 sprays in each nostril twice a day Stop any antihistamines for about 1 week. Then you may begin an antihistamine once a day as needed for a runny nose or itch Continue montelukast 10 mg once a day for now. Consider saline nasal rinses as needed for nasal symptoms. Use this before any medicated nasal sprays for best result  Shortness of breath Begin Trelegy 100-1 puff once a day to prevent cough or wheeze Continue albuterol 2 puffs every  4 hours as needed for cough or wheeze OR Instead use albuterol 0.083% solution via nebulizer one unit vial every 4 hours as needed for cough or wheeze  Reflux Continue dietary and lifestyle modifications as listed below Continue pantoprazole 20 mg once a day to control reflux  COVID exposure Your rapid COVID test was negative at today's visit Continue to monitor for any syumptoms  Call the clinic if this treatment plan is not working well for you  Follow up in 2 months  or sooner if needed.   Return in about 2 months (around 07/22/2020), or if symptoms worsen or fail to improve.    Thank you for the opportunity to care for this patient.  Please do not hesitate to contact me with questions.  Gareth Morgan, FNP [facility]

## 2020-05-25 DIAGNOSIS — J3089 Other allergic rhinitis: Secondary | ICD-10-CM | POA: Diagnosis not present

## 2020-05-25 NOTE — Progress Notes (Signed)
VIALS EXP 05-25-21 

## 2020-06-03 ENCOUNTER — Other Ambulatory Visit: Payer: Self-pay | Admitting: Family Medicine

## 2020-06-12 ENCOUNTER — Ambulatory Visit (INDEPENDENT_AMBULATORY_CARE_PROVIDER_SITE_OTHER): Payer: BC Managed Care – PPO

## 2020-06-12 DIAGNOSIS — J309 Allergic rhinitis, unspecified: Secondary | ICD-10-CM

## 2020-06-26 ENCOUNTER — Ambulatory Visit: Payer: Self-pay

## 2020-06-26 ENCOUNTER — Telehealth: Payer: Self-pay

## 2020-06-26 ENCOUNTER — Ambulatory Visit (INDEPENDENT_AMBULATORY_CARE_PROVIDER_SITE_OTHER): Payer: BC Managed Care – PPO | Admitting: Family Medicine

## 2020-06-26 ENCOUNTER — Encounter: Payer: Self-pay | Admitting: Family Medicine

## 2020-06-26 ENCOUNTER — Other Ambulatory Visit: Payer: Self-pay

## 2020-06-26 DIAGNOSIS — J4540 Moderate persistent asthma, uncomplicated: Secondary | ICD-10-CM | POA: Diagnosis not present

## 2020-06-26 DIAGNOSIS — K219 Gastro-esophageal reflux disease without esophagitis: Secondary | ICD-10-CM

## 2020-06-26 DIAGNOSIS — J3089 Other allergic rhinitis: Secondary | ICD-10-CM

## 2020-06-26 DIAGNOSIS — J309 Allergic rhinitis, unspecified: Secondary | ICD-10-CM

## 2020-06-26 DIAGNOSIS — J302 Other seasonal allergic rhinitis: Secondary | ICD-10-CM

## 2020-06-26 DIAGNOSIS — Z006 Encounter for examination for normal comparison and control in clinical research program: Secondary | ICD-10-CM

## 2020-06-26 MED ORDER — LEVOCETIRIZINE DIHYDROCHLORIDE 5 MG PO TABS: 5.0000 mg | ORAL_TABLET | Freq: Every evening | ORAL | 5 refills | Status: DC

## 2020-06-26 MED ORDER — PANTOPRAZOLE SODIUM 20 MG PO TBEC: 20.0000 mg | DELAYED_RELEASE_TABLET | Freq: Every day | ORAL | 1 refills | Status: DC

## 2020-06-26 NOTE — Patient Instructions (Addendum)
Allergic rhintiis Increase Xhance nasal spray to 2 sprays in each nostril twice a day to help control nasal congestion Stop Claritin. Begin Xyzal 5 mg once a day as needed for a runny nose. For breakthrough symptoms, you may take an additional Xyzal 5 mg once a day Stop montelukast for now. Consider saline nasal rinses as needed for nasal symptoms. Use this before any medicated nasal sprays for best result Continue allergen immunotherapy and have access to an epinephrine auto-injector set  Shortness of breath/cough Continue Trelegy 100-1 puff once a day to prevent cough or wheeze Continue albuterol 2 puffs every 4 hours as needed for cough or wheeze OR Instead use albuterol 0.083% solution via nebulizer one unit vial every 4 hours as needed for cough or wheeze  Reflux Continue dietary and lifestyle modifications as listed below Restart pantoprazole 20 mg once a day to control reflux  Call the clinic if this treatment plan is not working well for you  Follow up in 2 months or sooner if needed.

## 2020-06-26 NOTE — Progress Notes (Addendum)
RE: Denise Werner MRN: [id number] DOB: September 02, 1955 Date of Telemedicine Visit: 06/26/2020  Referring provider: Fayrene Helper, MD Primary care provider: Fayrene Helper, MD  Chief Complaint: Cough   Telemedicine Follow Up Visit via Telephone: I connected with Denise Werner for a follow up on 06/26/20 by telephone and verified that [name] using two identifiers.   I discussed the limitations, risks, security and privacy concerns of performing an evaluation and management service by telephone and the availability of in person appointments. I also discussed with the patient that there may be a patient responsible charge related to this service. The patient expressed understanding and agreed to proceed.  Patient is at home Provider is at the office.  Visit start time: 9379 Visit end time: [place] consent/check in by: [facility] consent and medical assistant/nurse: Alisa  History of Present Illness: She is a 65 y.o. female, who is being followed for asthma, allergic rhinitis, acute sinusitis and reflux. Her previous allergy office visit was on 05/22/2020 with Gareth Morgan, [place].  At today's visit, she reports that she continues to experience a cough producing thick mucus occurring mainly in the morning and also intermittently throughout the daytime. She reports her asthma as moderately well controlled with no shortness of breath or wheeze with activity or rest. She continues Trelegy once a day and uses albuterol about 2 times a week with mild relief of symptoms. Allergic rhinitis is reported as poorly controlled with symptoms including clear rhinorrhea, nasal congestion, frequent sneezing, and thick post nasal drainage with frequent throat clearing. She continues Xhance 2 sprays in each nostril on most days, Claritin 10 mg once a day, and montelukast 10 mg once a day. She reports that montelukast is not effective. She has previously tried several  antihistamines without relief of symptoms including Allegra, Zyrtec, and Claritin. She is not currently using a nasal saline rinse or saline mist. She continues allergen immunotherapy with no local reactions. She reports a significant decrease in her symptoms of allergic rhinitis while continuing on allergen immunotherapy. Reflux is reported as poorly controlled with heartburn occurring frequently. She reports that when she takes omeprazole, her reflux is much more well controlled. She has been out of omeprazole for about 2 weeks. Her current medications are listed in the chart.   Assessment and Plan: [name] is a 65 y.o. female with: Patient Instructions  Allergic rhintiis Increase Xhance nasal spray to 2 sprays in each nostril twice a day to help control nasal congestion Stop Claritin. Begin Xyzal 5 mg once a day as needed for a runny nose. For breakthrough symptoms, you may take an additional Xyzal 5 mg once a day Stop montelukast for now. Consider saline nasal rinses as needed for nasal symptoms. Use this before any medicated nasal sprays for best result Continue allergen immunotherapy and have access to an epinephrine auto-injector set  Shortness of breath/cough Continue Trelegy 100-1 puff once a day to prevent cough or wheeze Continue albuterol 2 puffs every 4 hours as needed for cough or wheeze OR Instead use albuterol 0.083% solution via nebulizer one unit vial every 4 hours as needed for cough or wheeze  Reflux Continue dietary and lifestyle modifications as listed below Restart pantoprazole 20 mg once a day to control reflux  Call the clinic if this treatment plan is not working well for you  Follow up in 2 months or sooner if needed.   Return in about 2 months (around 08/26/2020), or  if symptoms worsen or fail to improve.  Meds ordered this encounter  Medications  . pantoprazole (PROTONIX) 20 MG tablet    Sig: Take 1 tablet (20 mg total) by mouth daily.    Dispense:  90  tablet    Refill:  1  . levocetirizine (XYZAL) 5 MG tablet    Sig: Take 1 tablet (5 mg total) by mouth every evening.    Dispense:  31 tablet    Refill:  5    Medication List:  Current Outpatient Medications  Medication Sig Dispense Refill  . albuterol (VENTOLIN HFA) 108 (90 Base) MCG/ACT inhaler Inhale 2 puffs into the lungs every 6 (six) hours as needed for wheezing or shortness of breath. 18 g 1  . amLODipine (NORVASC) 5 MG tablet TAKE 1 TABLET BY MOUTH EVERY DAY 90 tablet 1  . Calcium Carbonate-Vit D-Min (CALCIUM 1200) 1200-1000 MG-UNIT CHEW Chew 2 tablets by mouth daily.    . cyclobenzaprine (FLEXERIL) 10 MG tablet TAKE 1 TABLET BY MOUTH THREE TIMES A DAY AS NEEDED FOR MUSCLE SPASMS 30 tablet 0  . fluticasone furoate-vilanterol (BREO ELLIPTA) 100-25 MCG/INH AEPB TAKE 1 PUFF BY MOUTH EVERY DAY 60 each 5  . Fluticasone Propionate (XHANCE) 93 MCG/ACT EXHU Place 2 puffs into the nose 2 (two) times daily. 32 mL 11  . Fluticasone-Umeclidin-Vilant (TRELEGY ELLIPTA) 100-62.5-25 MCG/INH AEPB Inhale 1 puff into the lungs daily. 28 each 5  . gabapentin (NEURONTIN) 100 MG capsule TAKE 1 CAPSULE BY MOUTH AT BEDTIME AS NEEDED 90 capsule 1  . ibuprofen (ADVIL) 600 MG tablet Take one tablet by mouth every 8 hours as needed for  Back and left knee pain 30 tablet 0  . levalbuterol (XOPENEX) 1.25 MG/3ML nebulizer solution Take 1.25 mg by nebulization every 4 (four) hours as needed for wheezing. 72 mL 3  . levocetirizine (XYZAL) 5 MG tablet Take 1 tablet (5 mg total) by mouth every evening. 31 tablet 5  . meloxicam (MOBIC) 15 MG tablet Take 1 tablet by mouth daily.    . montelukast (SINGULAIR) 10 MG tablet TAKE 1 TABLET BY MOUTH EVERYDAY AT BEDTIME 90 tablet 1  . Olopatadine HCl (PAZEO) 0.7 % SOLN Place 1 drop into both eyes daily.    . ondansetron (ZOFRAN ODT) 4 MG disintegrating tablet Take 1 tablet (4 mg total) by mouth every 8 (eight) hours as needed for nausea or vomiting. 20 tablet 0  . ondansetron  (ZOFRAN) 4 MG tablet TAKE 1 TABLET BY MOUTH EVERY 8 HOURS AS NEEDED FOR NAUSEA AND VOMITING 18 tablet 1  . Polyvinyl Alcohol-Povidone (REFRESH OP) Place 1 drop into both eyes 2 (two) times daily.    . rosuvastatin (CRESTOR) 10 MG tablet Take 1 tablet (10 mg total) by mouth daily. 90 tablet 1  . zoledronic acid (RECLAST) 5 MG/100ML SOLN injection Inject 5 mg into the vein.    . Carbinoxamine Maleate ER [facility] ER) 4 MG/5ML SUER Take 4 mg by mouth 2 (two) times daily as needed. (Patient not taking: No sig reported) 480 mL 1  . ELMIRON 100 MG capsule TAKE ONE CAPSULE ONCE DAILY FOR BLADDER SPASM (Patient not taking: Reported on 06/26/2020) 30 capsule 5  . metoprolol tartrate (LOPRESSOR) 100 MG tablet Take 1 tablet (100 mg total) by mouth once for 1 dose. Please take two hours prior to CT 1 tablet 0  . pantoprazole (PROTONIX) 20 MG tablet Take 1 tablet (20 mg total) by mouth daily. 30 tablet 2  . pantoprazole (PROTONIX) 20  MG tablet Take 1 tablet (20 mg total) by mouth daily. 90 tablet 1   No current facility-administered medications for this visit.   Allergies: Allergies  Allergen Reactions  . Hydrocodone Nausea And Vomiting and Nausea Only    Also causes dizziness and states feels like she has an outer body experience. Also causes dizziness and states feels like she has an outer body experience.  . Other Rash  . Sulfonamide Derivatives Rash   I reviewed her past medical history, social history, family history, and environmental history and no significant changes have been reported from previous visit on 05/22/2020.  Objective: Physical Exam Not obtained as encounter was done via telephone.   Previous notes and tests were reviewed.  I discussed the assessment and treatment plan with the patient. The patient was provided an opportunity to ask questions and all were answered. The patient agreed with the plan and demonstrated an understanding of the instructions.   The patient was advised to  call back or seek an in-person evaluation if the symptoms worsen or if the condition fails to improve as anticipated.  I provided 22 minutes of non-face-to-face time during this encounter.  It was my pleasure to participate in [facility] care today. Please feel free to contact me with any questions or concerns.   Sincerely,  Gareth Morgan, FNP

## 2020-06-26 NOTE — Telephone Encounter (Signed)
I called patient for her 90-day Identify Study follow up phone call. Patient is doing well with no cardiac symptoms at this time. I reminded patient I would call her in February for her 1 year follow-up.

## 2020-07-01 ENCOUNTER — Telehealth: Payer: Self-pay

## 2020-07-01 NOTE — Telephone Encounter (Signed)
Called no answer, could not leave a message due to mailbox being full.

## 2020-07-01 NOTE — Telephone Encounter (Signed)
-----   Message from Valentina Shaggy, MD sent at 07/01/2020  6:11 AM EDT ----- Can we make sure that [name] is feeling better? Thanks!

## 2020-07-01 NOTE — Telephone Encounter (Signed)
Patient called back and informed me that she was doing much better. She stated that she still has a slight cough but nothing like she was having.

## 2020-07-02 NOTE — Telephone Encounter (Signed)
Great!  Thank you for reaching out.  [name], MD [facility]

## 2020-07-03 IMAGING — MG DIGITAL SCREENING BILAT W/ TOMO W/ CAD
8 series · 8 of 24 positions shown · non-contrast
Comparison: Previous exam(s).

CLINICAL DATA: Screening.

EXAM:
DIGITAL SCREENING BILATERAL MAMMOGRAM WITH TOMO AND CAD

[L CC synth-2D]
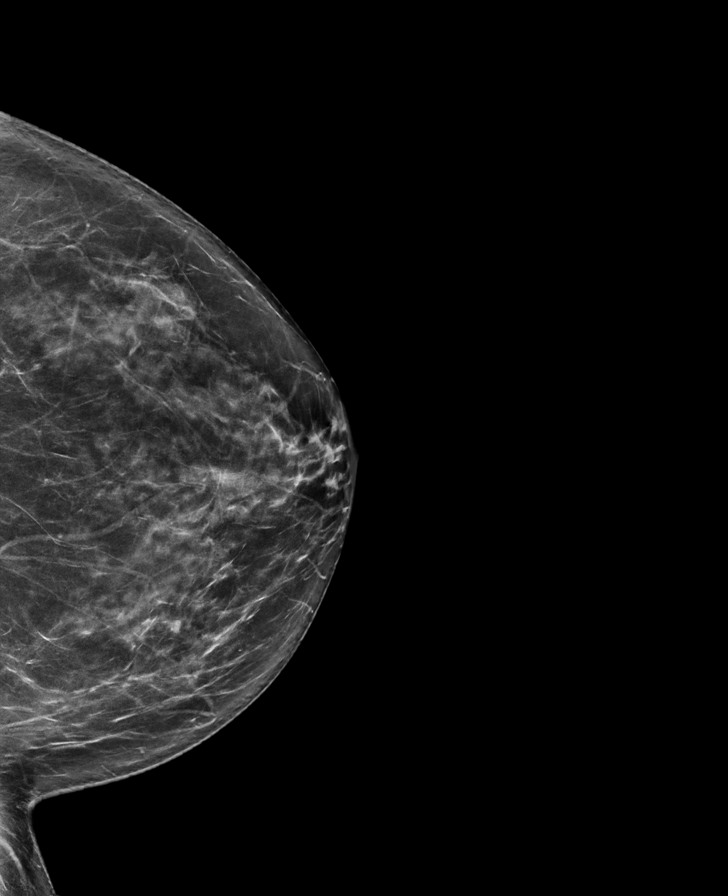

[R MLO synth-2D]
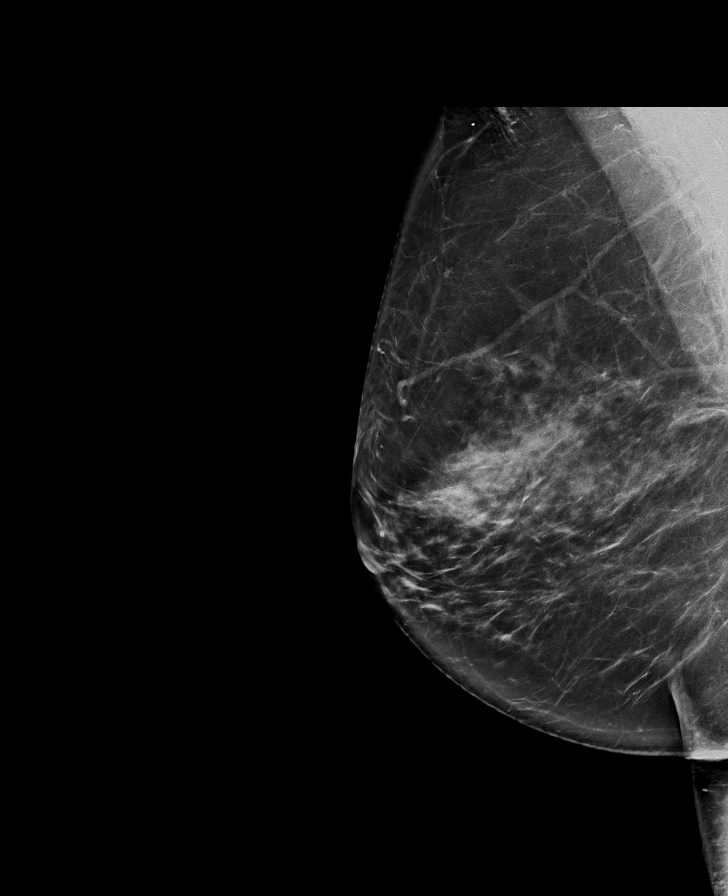

[R CC synth-2D]
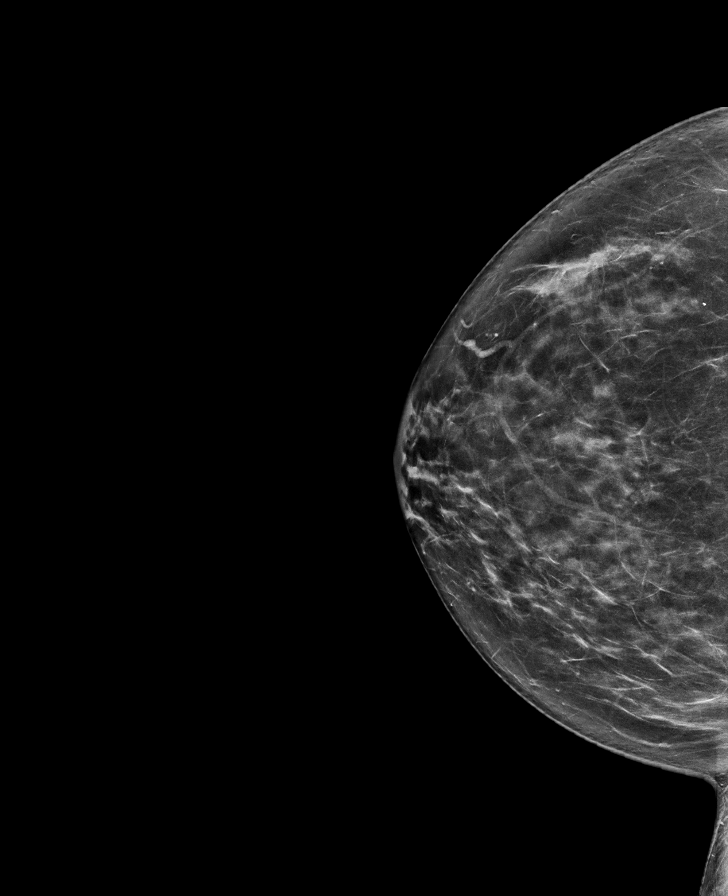

[L MLO synth-2D]
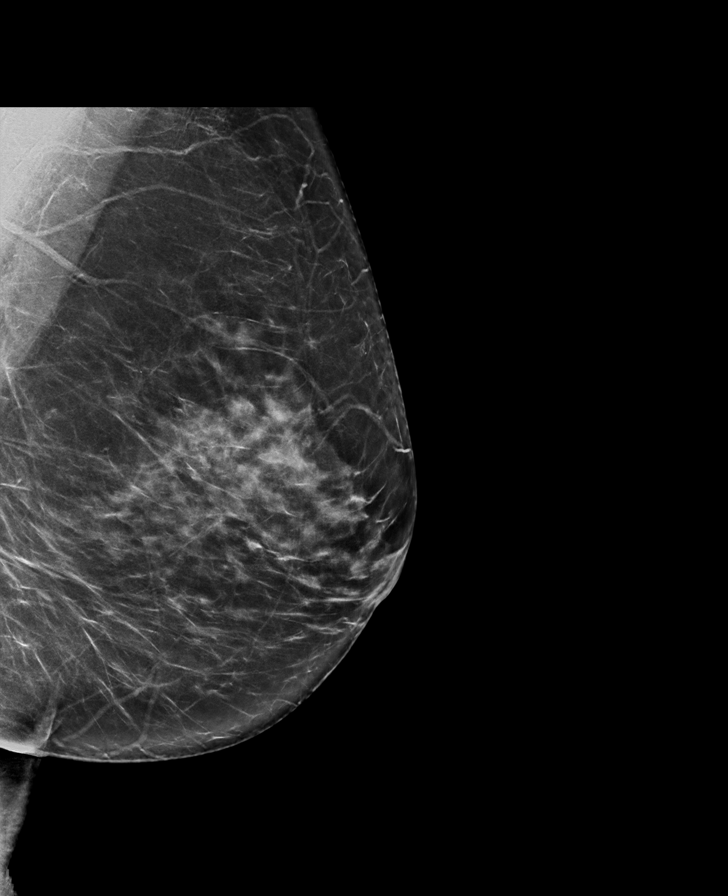

[L CC tomo · tomo slice 39/76.0]
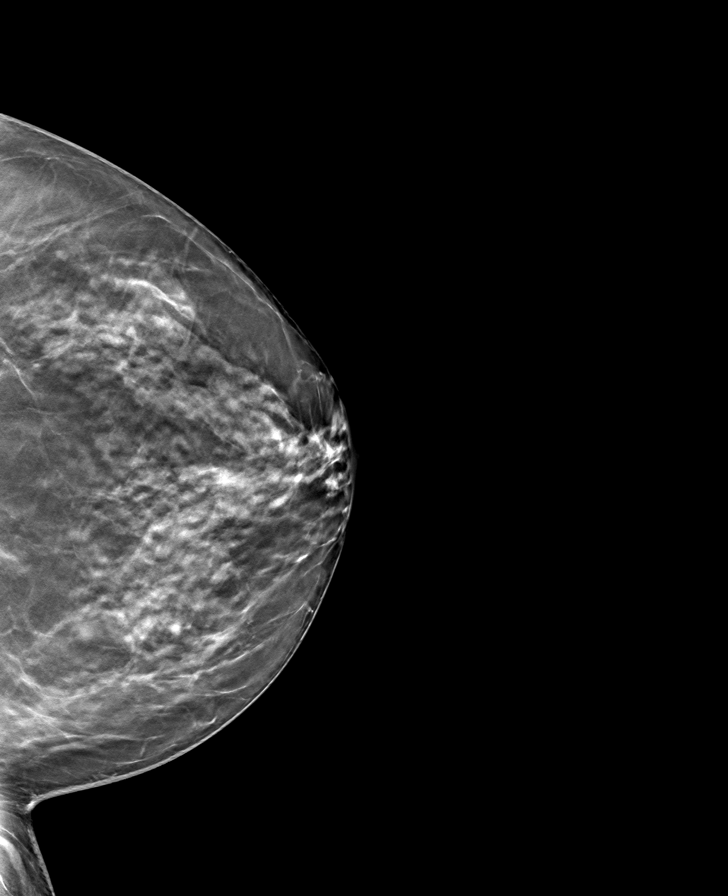

[R MLO tomo · tomo slice 45/90.0]
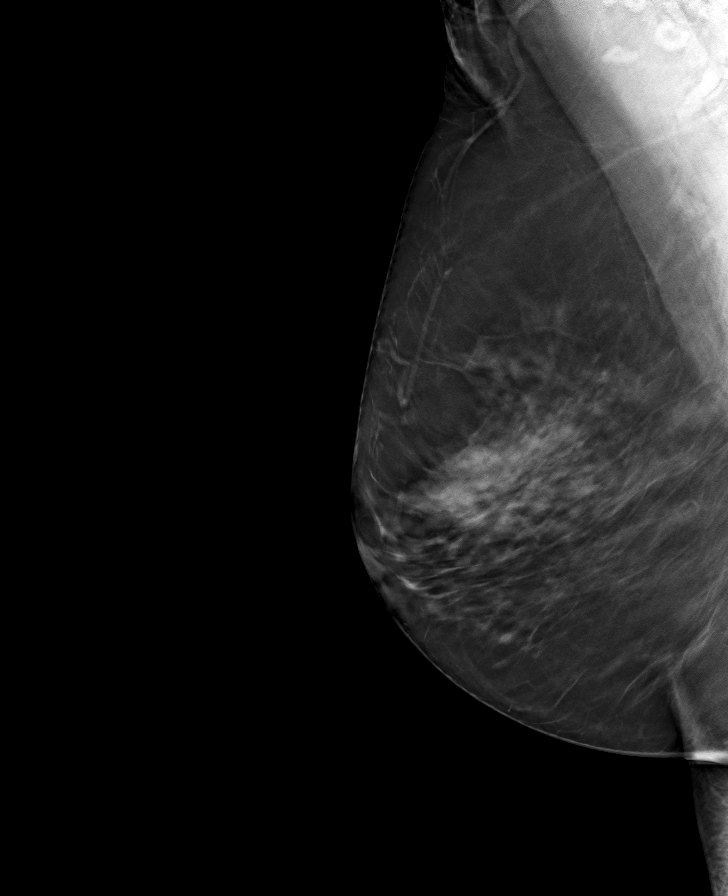

[L MLO tomo · tomo slice 43/85.0]
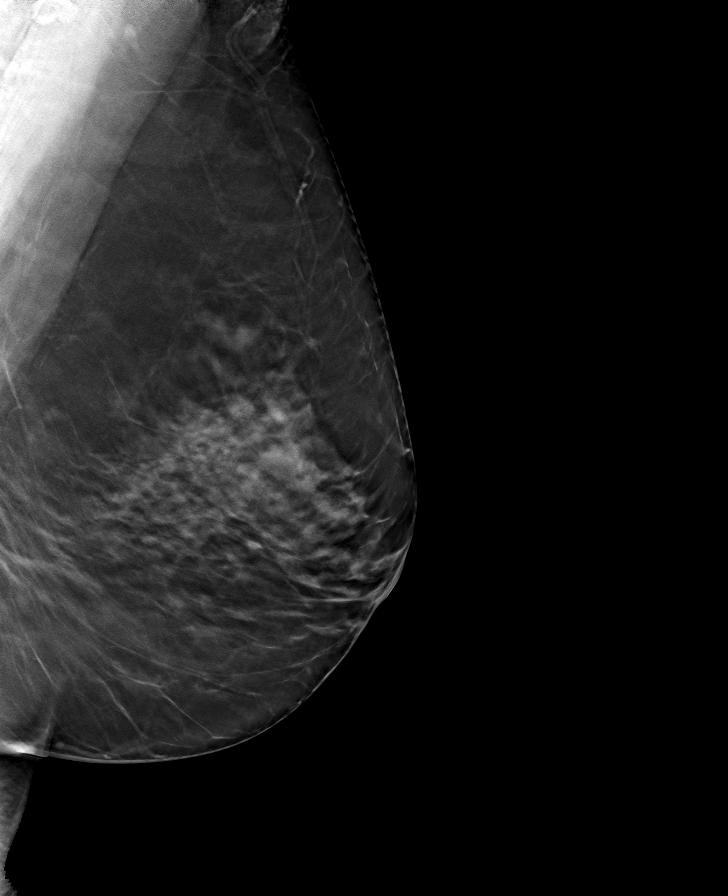

[R CC tomo · tomo slice 39/77.0]
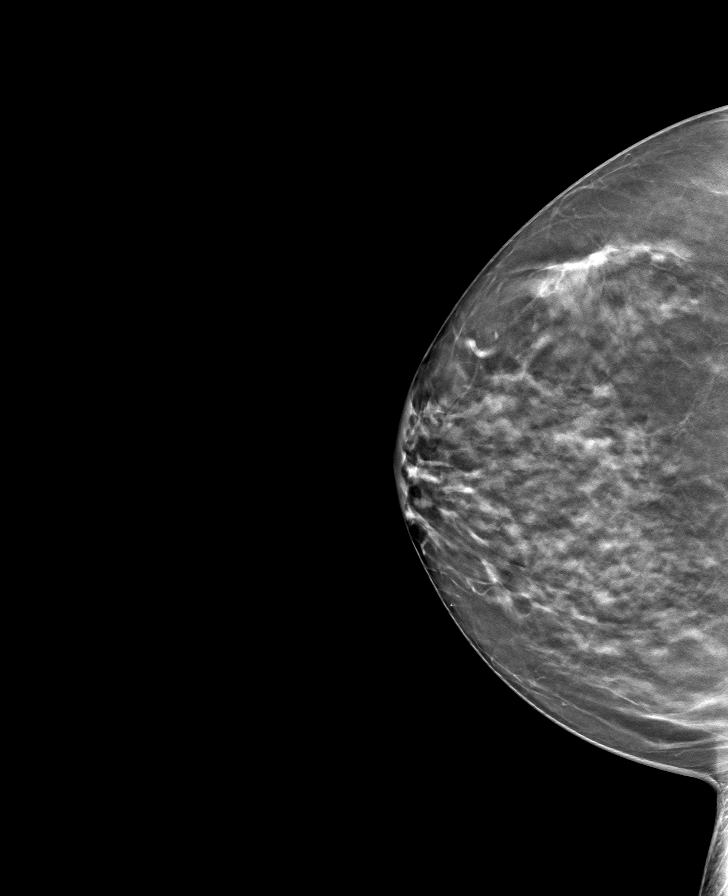

[8 of 24 positions shown; findings below may reference images not displayed]

ACR Breast Density Category c: The breast tissue is heterogeneously
dense, which may obscure small masses.
FINDINGS: There are no findings suspicious for malignancy. Images were
processed with CAD.
IMPRESSION: No mammographic evidence of malignancy. A result letter of this
screening mammogram will be mailed directly to the patient.

RECOMMENDATION:
Screening mammogram in one year. (Code:[id number])

BI-RADS CATEGORY  1: Negative.

## 2020-07-07 ENCOUNTER — Ambulatory Visit: Payer: BC Managed Care – PPO | Admitting: Family Medicine

## 2020-07-17 ENCOUNTER — Ambulatory Visit (INDEPENDENT_AMBULATORY_CARE_PROVIDER_SITE_OTHER): Payer: BC Managed Care – PPO

## 2020-07-17 DIAGNOSIS — J309 Allergic rhinitis, unspecified: Secondary | ICD-10-CM | POA: Diagnosis not present

## 2020-07-29 ENCOUNTER — Ambulatory Visit (INDEPENDENT_AMBULATORY_CARE_PROVIDER_SITE_OTHER): Payer: BC Managed Care – PPO

## 2020-07-29 DIAGNOSIS — J309 Allergic rhinitis, unspecified: Secondary | ICD-10-CM

## 2020-08-04 ENCOUNTER — Ambulatory Visit: Payer: BC Managed Care – PPO | Admitting: Family Medicine

## 2020-08-04 ENCOUNTER — Encounter: Payer: Self-pay | Admitting: Family Medicine

## 2020-08-04 ENCOUNTER — Other Ambulatory Visit: Payer: Self-pay

## 2020-08-04 VITALS — BP 126/80 | HR 74 | Temp 98.7°F | Resp 18 | Ht 66.0 in | Wt 150.0 lb

## 2020-08-04 DIAGNOSIS — I10 Essential (primary) hypertension: Secondary | ICD-10-CM

## 2020-08-04 DIAGNOSIS — J4540 Moderate persistent asthma, uncomplicated: Secondary | ICD-10-CM

## 2020-08-04 DIAGNOSIS — R7303 Prediabetes: Secondary | ICD-10-CM

## 2020-08-04 DIAGNOSIS — E785 Hyperlipidemia, unspecified: Secondary | ICD-10-CM

## 2020-08-04 DIAGNOSIS — E663 Overweight: Secondary | ICD-10-CM

## 2020-08-04 DIAGNOSIS — M8580 Other specified disorders of bone density and structure, unspecified site: Secondary | ICD-10-CM | POA: Diagnosis not present

## 2020-08-04 DIAGNOSIS — Z9109 Other allergy status, other than to drugs and biological substances: Secondary | ICD-10-CM

## 2020-08-04 DIAGNOSIS — E559 Vitamin D deficiency, unspecified: Secondary | ICD-10-CM

## 2020-08-04 NOTE — Assessment & Plan Note (Signed)
On weekly immunotherapy

## 2020-08-04 NOTE — Progress Notes (Signed)
Denise Werner     MRN: [id number]      DOB: [birth year]   HPI [name] is here for follow up and re-evaluation of chronic medical conditions, medication management and review of any available recent lab and radiology data.  Preventive health is updated, specifically  Cancer screening and Immunization.   Questions or concerns regarding consultations or procedures which the PT has had in the interim are  addressed. The PT denies any adverse reactions to current medications since the last visit.  There are no new concerns.  There are no specific complaints  Doig well overall. Needs updated dexa, also question as to ongoing need for bone builder As asthmatic should have her pneumonia vaccine will message allergist also re timing in particular, gets weekly immunotherapy Occasional  but markedly reduced back and right arm pain  ROS Denies recent fever or chills. Denies sinus pressure, nasal congestion, ear pain or sore throat. Denies chest congestion, productive cough or wheezing. Denies chest pains, palpitations and leg swelling Denies abdominal pain, nausea, vomiting,diarrhea or constipation.   Denies dysuria, frequency, hesitancy or incontinence. Denies joint pain, swelling and limitation in mobility. Denies headaches, seizures, numbness, or tingling. Denies depression, anxiety or insomnia. Denies skin break down or rash.   PE  BP 126/80 (BP Location: Right Arm, Patient Position: Sitting, Cuff Size: Large)   Pulse 74   Temp 98.7 F (37.1 C)   Resp 18   Ht 5\' 6"  (1.676 m)   Wt 150 lb (68 kg)   SpO2 98%   BMI 24.21 kg/m   Patient alert and oriented and in no cardiopulmonary distress.  HEENT: No facial asymmetry, EOMI,     Neck supple .  Chest: Clear to auscultation bilaterally.  CVS: S1, S2 no murmurs, no S3.Regular rate.  ABD: Soft non tender.   Ext: No edema  MS: Adequate ROM spine, shoulders, hips and knees.  Skin: Intact, no ulcerations or rash  noted.  Psych: Good eye contact, normal affect. Memory intact not anxious or depressed appearing.  CNS: CN 2-12 intact, power,  normal throughout.no focal deficits noted.   Assessment & Plan  HTN (hypertension) Controlled, no change in medication DASH diet and commitment to daily physical activity for a minimum of 30 minutes discussed and encouraged, as a part of hypertension management. The importance of attaining a healthy weight is also discussed.  BP/Weight 08/04/2020 05/22/2020 03/18/2020 01/08/2020 01/07/2020 12/12/2019 90/24/0973  Systolic BP 532 992 426 - 834 196 222  Diastolic BP 80 88 76 - 70 76 84  Wt. (Lbs) 150 - - 154.4 152.04 - 155.6  BMI 24.21 25.69 - 25.11 24.54 - 25.11       Hyperlipidemia LDL goal <100 Hyperlipidemia:Low fat diet discussed and encouraged.   Lipid Panel  Lab Results  Component Value Date   CHOL 206 (H) 01/02/2020   HDL 51 01/02/2020   LDLCALC 146 (H) 01/02/2020   TRIG 48 01/02/2020   CHOLHDL 4.0 01/02/2020     Updated lab needed at/ before next visit. Not at goal when last checked  Moderate persistent asthma, uncomplicated Controlled needs Pneumonia vaccine  Overweight (BMI 25.0-29.9)  Patient re-educated about  the importance of commitment to a  minimum of 150 minutes of exercise per week as able.  The importance of healthy food choices with portion control discussed, as well as eating regularly and within a 12 hour window most days. The need to choose "clean , green" food 50 to 75% of the  time is discussed, as well as to make water the primary drink and set a goal of 64 ounces water daily.    Weight /BMI 08/04/2020 05/22/2020 01/08/2020  WEIGHT 150 lb - 154 lb 6.4 oz  HEIGHT 5\' 6"  5\' 5"  5' 5.748"  BMI 24.21 kg/m2 25.69 kg/m2 25.11 kg/m2      Prediabetes Patient educated about the importance of limiting  Carbohydrate intake , the need to commit to daily physical activity for a minimum of 30 minutes , and to commit weight  loss. The fact that changes in all these areas will reduce or eliminate all together the development of diabetes is stressed.   Diabetic Labs Latest Ref Rng & Units 03/16/2020 01/02/2020 08/30/2019 08/19/2019 12/14/2018  HbA1c 4.8 - 5.6 % - 5.5 - - 5.8(H)  Chol 100 - 199 mg/dL - 206(H) 215(H) - 152  HDL >39 mg/dL - 51 59 - 51  Calc LDL 0 - 99 mg/dL - 146(H) 141(H) - 89  Triglycerides 0 - 149 mg/dL - 48 62 - 41  Creatinine 0.44 - 1.00 mg/dL 0.84 0.79 0.73 0.70 0.80   BP/Weight 08/04/2020 05/22/2020 03/18/2020 01/08/2020 01/07/2020 12/12/2019 10/23/3005  Systolic BP 622 633 354 - 562 563 893  Diastolic BP 80 88 76 - 70 76 84  Wt. (Lbs) 150 - - 154.4 152.04 - 155.6  BMI 24.21 25.69 - 25.11 24.54 - 25.11   No flowsheet data found.  Updated lab needed at/ before next visit.   Environmental allergies On weekly immunotherapy

## 2020-08-04 NOTE — Assessment & Plan Note (Signed)
Controlled, no change in medication DASH diet and commitment to daily physical activity for a minimum of 30 minutes discussed and encouraged, as a part of hypertension management. The importance of attaining a healthy weight is also discussed.  BP/Weight 08/04/2020 05/22/2020 03/18/2020 01/08/2020 01/07/2020 12/12/2019 31/54/0086  Systolic BP 761 950 932 - 671 245 809  Diastolic BP 80 88 76 - 70 76 84  Wt. (Lbs) 150 - - 154.4 152.04 - 155.6  BMI 24.21 25.69 - 25.11 24.54 - 25.11

## 2020-08-04 NOTE — Assessment & Plan Note (Signed)
Patient educated about the importance of limiting  Carbohydrate intake , the need to commit to daily physical activity for a minimum of 30 minutes , and to commit weight loss. The fact that changes in all these areas will reduce or eliminate all together the development of diabetes is stressed.   Diabetic Labs Latest Ref Rng & Units 03/16/2020 01/02/2020 08/30/2019 08/19/2019 12/14/2018  HbA1c 4.8 - 5.6 % - 5.5 - - 5.8(H)  Chol 100 - 199 mg/dL - 206(H) 215(H) - 152  HDL >39 mg/dL - 51 59 - 51  Calc LDL 0 - 99 mg/dL - 146(H) 141(H) - 89  Triglycerides 0 - 149 mg/dL - 48 62 - 41  Creatinine 0.44 - 1.00 mg/dL 0.84 0.79 0.73 0.70 0.80   BP/Weight 08/04/2020 05/22/2020 03/18/2020 01/08/2020 01/07/2020 12/12/2019 40/34/7425  Systolic BP 956 387 564 - 332 951 884  Diastolic BP 80 88 76 - 70 76 84  Wt. (Lbs) 150 - - 154.4 152.04 - 155.6  BMI 24.21 25.69 - 25.11 24.54 - 25.11   No flowsheet data found.  Updated lab needed at/ before next visit.

## 2020-08-04 NOTE — Assessment & Plan Note (Signed)
  Patient re-educated about  the importance of commitment to a  minimum of 150 minutes of exercise per week as able.  The importance of healthy food choices with portion control discussed, as well as eating regularly and within a 12 hour window most days. The need to choose "clean , green" food 50 to 75% of the time is discussed, as well as to make water the primary drink and set a goal of 64 ounces water daily.    Weight /BMI 08/04/2020 05/22/2020 01/08/2020  WEIGHT 150 lb - 154 lb 6.4 oz  HEIGHT 5\' 6"  5\' 5"  5' 5.748"  BMI 24.21 kg/m2 25.69 kg/m2 25.11 kg/m2

## 2020-08-04 NOTE — Assessment & Plan Note (Signed)
Controlled needs Pneumonia vaccine

## 2020-08-04 NOTE — Patient Instructions (Addendum)
Annual exam in December, call if you need me before  Need bone density   Please get fasting  lipid, cmp and EGFR, HBa1C  and Vit D asap  Will get back to you re pneumonia vaccine  It is important that you exercise regularly at least 30 minutes 5 times a week. If you develop chest pain, have severe difficulty breathing, or feel very tired, stop exercising immediately and seek medical attention    Think about what you will eat, plan ahead. Choose " clean, green, fresh or frozen" over canned, processed or packaged foods which are more sugary, salty and fatty. 70 to 75% of food eaten should be vegetables and fruit. Three meals at set times with snacks allowed between meals, but they must be fruit or vegetables. Aim to eat over a 12 hour period , example 7 am to 7 pm, and STOP after  your last meal of the day. Drink water,generally about 64 ounces per day, no other drink is as healthy. Fruit juice is best enjoyed in a healthy way, by EATING the fruit.  Thanks for choosing [facility], we consider it a privelige to serve you.

## 2020-08-04 NOTE — Assessment & Plan Note (Signed)
Hyperlipidemia:Low fat diet discussed and encouraged.   Lipid Panel  Lab Results  Component Value Date   CHOL 206 (H) 01/02/2020   HDL 51 01/02/2020   LDLCALC 146 (H) 01/02/2020   TRIG 48 01/02/2020   CHOLHDL 4.0 01/02/2020     Updated lab needed at/ before next visit. Not at goal when last checked

## 2020-08-07 ENCOUNTER — Ambulatory Visit (INDEPENDENT_AMBULATORY_CARE_PROVIDER_SITE_OTHER): Payer: BC Managed Care – PPO

## 2020-08-07 DIAGNOSIS — J309 Allergic rhinitis, unspecified: Secondary | ICD-10-CM | POA: Diagnosis not present

## 2020-08-07 LAB — VITAMIN D 25 HYDROXY (VIT D DEFICIENCY, FRACTURES): Vit D, 25-Hydroxy: 46.6 ng/mL (ref 30.0–100.0)

## 2020-08-07 LAB — CMP14+EGFR
ALT: 23 IU/L (ref 0–32)
AST: 23 IU/L (ref 0–40)
Albumin/Globulin Ratio: 1.5 (ref 1.2–2.2)
Albumin: 4.3 g/dL (ref 3.8–4.8)
Alkaline Phosphatase: 109 IU/L (ref 44–121)
BUN/Creatinine Ratio: 16 (ref 12–28)
BUN: 15 mg/dL (ref 8–27)
Bilirubin Total: 0.3 mg/dL (ref 0.0–1.2)
CO2: 24 mmol/L (ref 20–29)
Calcium: 9.2 mg/dL (ref 8.7–10.3)
Chloride: 103 mmol/L (ref 96–106)
Creatinine, Ser: 0.95 mg/dL (ref 0.57–1.00)
Globulin, Total: 2.8 g/dL (ref 1.5–4.5)
Glucose: 95 mg/dL (ref 65–99)
Potassium: 4.4 mmol/L (ref 3.5–5.2)
Sodium: 140 mmol/L (ref 134–144)
Total Protein: 7.1 g/dL (ref 6.0–8.5)
eGFR: 67 mL/min/{1.73_m2} (ref >59)

## 2020-08-07 LAB — LIPID PANEL
Chol/HDL Ratio: 2.6 ratio (ref 0.0–4.4)
Cholesterol, Total: 146 mg/dL (ref 100–199)
HDL: 56 mg/dL (ref >39)
LDL Chol Calc (NIH): 80 mg/dL (ref 0–99)
Triglycerides: 46 mg/dL (ref 0–149)
VLDL Cholesterol Cal: 10 mg/dL (ref 5–40)

## 2020-08-07 LAB — HEMOGLOBIN A1C
Est. average glucose Bld gHb Est-mCnc: 126 mg/dL
Hgb A1c MFr Bld: 6 % — ABNORMAL HIGH (ref 4.8–5.6)

## 2020-08-11 ENCOUNTER — Ambulatory Visit (HOSPITAL_COMMUNITY)
Admission: RE | Admit: 2020-08-11 | Discharge: 2020-08-11 | Disposition: A | Discharge status: Home / Self Care | Payer: BC Managed Care – PPO | Source: Ambulatory Visit | Attending: Family Medicine | Admitting: Family Medicine

## 2020-08-11 ENCOUNTER — Other Ambulatory Visit: Payer: Self-pay

## 2020-08-11 DIAGNOSIS — M8580 Other specified disorders of bone density and structure, unspecified site: Secondary | ICD-10-CM | POA: Diagnosis not present

## 2020-08-14 ENCOUNTER — Ambulatory Visit (INDEPENDENT_AMBULATORY_CARE_PROVIDER_SITE_OTHER): Payer: BC Managed Care – PPO

## 2020-08-14 DIAGNOSIS — J309 Allergic rhinitis, unspecified: Secondary | ICD-10-CM

## 2020-08-18 ENCOUNTER — Encounter (HOSPITAL_COMMUNITY)
Admission: RE | Admit: 2020-08-18 | Discharge: 2020-08-18 | Disposition: A | Discharge status: Home / Self Care | Payer: BC Managed Care – PPO | Source: Ambulatory Visit | Attending: Family Medicine | Admitting: Family Medicine

## 2020-08-20 ENCOUNTER — Encounter: Payer: Self-pay | Admitting: Family Medicine

## 2020-08-28 ENCOUNTER — Ambulatory Visit (INDEPENDENT_AMBULATORY_CARE_PROVIDER_SITE_OTHER): Payer: BC Managed Care – PPO

## 2020-08-28 DIAGNOSIS — J309 Allergic rhinitis, unspecified: Secondary | ICD-10-CM

## 2020-09-01 ENCOUNTER — Encounter (HOSPITAL_COMMUNITY)
Admission: RE | Admit: 2020-09-01 | Discharge: 2020-09-01 | Disposition: A | Discharge status: Home / Self Care | Payer: BC Managed Care – PPO | Source: Ambulatory Visit | Attending: Family Medicine | Admitting: Family Medicine

## 2020-09-01 ENCOUNTER — Other Ambulatory Visit: Payer: Self-pay

## 2020-09-01 DIAGNOSIS — M810 Age-related osteoporosis without current pathological fracture: Secondary | ICD-10-CM | POA: Insufficient documentation

## 2020-09-01 MED ORDER — SODIUM CHLORIDE 0.9 % IV SOLN: Freq: Once | INTRAVENOUS | Status: AC

## 2020-09-01 MED ORDER — ZOLEDRONIC ACID 5 MG/100ML IV SOLN
5.0000 mg | Freq: Once | INTRAVENOUS | Status: AC
  Administered 2020-09-01: 5 mg via INTRAVENOUS
  Filled 2020-09-01: qty 100

## 2020-09-11 ENCOUNTER — Ambulatory Visit (INDEPENDENT_AMBULATORY_CARE_PROVIDER_SITE_OTHER): Payer: BC Managed Care – PPO

## 2020-09-11 DIAGNOSIS — J309 Allergic rhinitis, unspecified: Secondary | ICD-10-CM | POA: Diagnosis not present

## 2020-09-18 ENCOUNTER — Ambulatory Visit (INDEPENDENT_AMBULATORY_CARE_PROVIDER_SITE_OTHER): Payer: BC Managed Care – PPO

## 2020-09-18 DIAGNOSIS — J309 Allergic rhinitis, unspecified: Secondary | ICD-10-CM

## 2020-09-30 ENCOUNTER — Encounter: Payer: Self-pay | Admitting: Family Medicine

## 2020-09-30 ENCOUNTER — Other Ambulatory Visit: Payer: Self-pay | Admitting: Family Medicine

## 2020-09-30 MED ORDER — PENTOSAN POLYSULFATE SODIUM 100 MG PO CAPS: ORAL_CAPSULE | ORAL | 1 refills | Status: DC

## 2020-09-30 NOTE — Progress Notes (Signed)
[place] x 2 months sent has IC thinks havoing flare, stressed

## 2020-10-01 ENCOUNTER — Other Ambulatory Visit: Payer: Self-pay

## 2020-10-01 ENCOUNTER — Ambulatory Visit (INDEPENDENT_AMBULATORY_CARE_PROVIDER_SITE_OTHER): Payer: BC Managed Care – PPO

## 2020-10-01 DIAGNOSIS — N3000 Acute cystitis without hematuria: Secondary | ICD-10-CM

## 2020-10-01 LAB — POCT URINALYSIS DIP (CLINITEK)
Bilirubin, UA: NEGATIVE
Blood, UA: NEGATIVE
Glucose, UA: NEGATIVE mg/dL
Ketones, POC UA: NEGATIVE mg/dL
Leukocytes, UA: NEGATIVE
Nitrite, UA: NEGATIVE
POC PROTEIN,UA: NEGATIVE
Spec Grav, UA: 1.025 (ref 1.010–1.025)
Urobilinogen, UA: 0.2 E.U./dL
pH, UA: 6.5 (ref 5.0–8.0)

## 2020-10-02 ENCOUNTER — Ambulatory Visit (INDEPENDENT_AMBULATORY_CARE_PROVIDER_SITE_OTHER): Payer: BC Managed Care – PPO

## 2020-10-02 DIAGNOSIS — J309 Allergic rhinitis, unspecified: Secondary | ICD-10-CM

## 2020-11-06 ENCOUNTER — Ambulatory Visit (INDEPENDENT_AMBULATORY_CARE_PROVIDER_SITE_OTHER): Payer: BC Managed Care – PPO

## 2020-11-06 ENCOUNTER — Other Ambulatory Visit: Payer: Self-pay | Admitting: Family Medicine

## 2020-11-06 DIAGNOSIS — J309 Allergic rhinitis, unspecified: Secondary | ICD-10-CM

## 2020-11-11 DIAGNOSIS — J3089 Other allergic rhinitis: Secondary | ICD-10-CM | POA: Diagnosis not present

## 2020-11-11 NOTE — Progress Notes (Signed)
VIALS MADE. EXP 11-11-21

## 2020-11-27 ENCOUNTER — Ambulatory Visit (INDEPENDENT_AMBULATORY_CARE_PROVIDER_SITE_OTHER): Payer: BC Managed Care – PPO

## 2020-11-27 DIAGNOSIS — J309 Allergic rhinitis, unspecified: Secondary | ICD-10-CM | POA: Diagnosis not present

## 2021-01-04 ENCOUNTER — Encounter: Payer: BC Managed Care – PPO | Admitting: Family Medicine

## 2021-01-06 ENCOUNTER — Ambulatory Visit (INDEPENDENT_AMBULATORY_CARE_PROVIDER_SITE_OTHER): Payer: BC Managed Care – PPO

## 2021-01-06 DIAGNOSIS — J309 Allergic rhinitis, unspecified: Secondary | ICD-10-CM | POA: Diagnosis not present

## 2021-02-02 ENCOUNTER — Other Ambulatory Visit: Payer: Self-pay | Admitting: Family Medicine

## 2021-02-02 DIAGNOSIS — Z1231 Encounter for screening mammogram for malignant neoplasm of breast: Secondary | ICD-10-CM

## 2021-02-05 ENCOUNTER — Ambulatory Visit (INDEPENDENT_AMBULATORY_CARE_PROVIDER_SITE_OTHER): Payer: BC Managed Care – PPO

## 2021-02-05 DIAGNOSIS — J309 Allergic rhinitis, unspecified: Secondary | ICD-10-CM | POA: Diagnosis not present

## 2021-02-12 ENCOUNTER — Other Ambulatory Visit: Payer: Self-pay

## 2021-02-12 DIAGNOSIS — E559 Vitamin D deficiency, unspecified: Secondary | ICD-10-CM

## 2021-02-12 DIAGNOSIS — R7303 Prediabetes: Secondary | ICD-10-CM

## 2021-02-12 DIAGNOSIS — E785 Hyperlipidemia, unspecified: Secondary | ICD-10-CM

## 2021-02-12 DIAGNOSIS — I10 Essential (primary) hypertension: Secondary | ICD-10-CM

## 2021-02-13 LAB — CMP14+EGFR
ALT: 33 IU/L — ABNORMAL HIGH (ref 0–32)
AST: 28 IU/L (ref 0–40)
Albumin/Globulin Ratio: 1.5 (ref 1.2–2.2)
Albumin: 4.1 g/dL (ref 3.8–4.8)
Alkaline Phosphatase: 109 IU/L (ref 44–121)
BUN/Creatinine Ratio: 15 (ref 12–28)
BUN: 13 mg/dL (ref 8–27)
Bilirubin Total: 0.3 mg/dL (ref 0.0–1.2)
CO2: 24 mmol/L (ref 20–29)
Calcium: 9.5 mg/dL (ref 8.7–10.3)
Chloride: 104 mmol/L (ref 96–106)
Creatinine, Ser: 0.85 mg/dL (ref 0.57–1.00)
Globulin, Total: 2.7 g/dL (ref 1.5–4.5)
Glucose: 101 mg/dL — ABNORMAL HIGH (ref 70–99)
Potassium: 4.6 mmol/L (ref 3.5–5.2)
Sodium: 139 mmol/L (ref 134–144)
Total Protein: 6.8 g/dL (ref 6.0–8.5)
eGFR: 76 mL/min/{1.73_m2} (ref >59)

## 2021-02-13 LAB — CBC
Hematocrit: 37.1 % (ref 34.0–46.6)
Hemoglobin: 11.8 g/dL (ref 11.1–15.9)
MCH: 27 pg (ref 26.6–33.0)
MCHC: 31.8 g/dL (ref 31.5–35.7)
MCV: 85 fL (ref 79–97)
Platelets: 254 10*3/uL (ref 150–450)
RBC: 4.37 x10E6/uL (ref 3.77–5.28)
RDW: 13 % (ref 11.7–15.4)
WBC: 5.8 10*3/uL (ref 3.4–10.8)

## 2021-02-13 LAB — TSH: TSH: 2.97 u[IU]/mL (ref 0.450–4.500)

## 2021-02-13 LAB — VITAMIN D 25 HYDROXY (VIT D DEFICIENCY, FRACTURES): Vit D, 25-Hydroxy: 51.4 ng/mL (ref 30.0–100.0)

## 2021-02-13 LAB — LIPID PANEL
Chol/HDL Ratio: 2.5 ratio (ref 0.0–4.4)
Cholesterol, Total: 136 mg/dL (ref 100–199)
HDL: 55 mg/dL (ref >39)
LDL Chol Calc (NIH): 73 mg/dL (ref 0–99)
Triglycerides: 31 mg/dL (ref 0–149)
VLDL Cholesterol Cal: 8 mg/dL (ref 5–40)

## 2021-02-16 ENCOUNTER — Encounter: Payer: Self-pay | Admitting: Family Medicine

## 2021-02-16 ENCOUNTER — Other Ambulatory Visit: Payer: Self-pay

## 2021-02-16 ENCOUNTER — Ambulatory Visit (INDEPENDENT_AMBULATORY_CARE_PROVIDER_SITE_OTHER): Payer: BC Managed Care – PPO | Admitting: Family Medicine

## 2021-02-16 VITALS — BP 136/76 | HR 89 | Ht 66.0 in | Wt 156.0 lb

## 2021-02-16 DIAGNOSIS — E785 Hyperlipidemia, unspecified: Secondary | ICD-10-CM

## 2021-02-16 DIAGNOSIS — Z23 Encounter for immunization: Secondary | ICD-10-CM

## 2021-02-16 DIAGNOSIS — Z0000 Encounter for general adult medical examination without abnormal findings: Secondary | ICD-10-CM | POA: Diagnosis not present

## 2021-02-16 DIAGNOSIS — I10 Essential (primary) hypertension: Secondary | ICD-10-CM

## 2021-02-16 DIAGNOSIS — R7303 Prediabetes: Secondary | ICD-10-CM | POA: Diagnosis not present

## 2021-02-16 LAB — SPECIMEN STATUS REPORT

## 2021-02-16 LAB — HGB A1C W/O EAG: Hgb A1c MFr Bld: 5.7 % — ABNORMAL HIGH (ref 4.8–5.6)

## 2021-02-16 MED ORDER — PANTOPRAZOLE SODIUM 20 MG PO TBEC: 20.0000 mg | DELAYED_RELEASE_TABLET | Freq: Every day | ORAL | 3 refills | Status: DC

## 2021-02-16 MED ORDER — XHANCE 93 MCG/ACT NA EXHU: 2.0000 | INHALANT_SUSPENSION | Freq: Two times a day (BID) | NASAL | 3 refills | Status: DC

## 2021-02-16 NOTE — Patient Instructions (Signed)
F/U in 6 months, call if you need me sooner  Fasting lipid, cmp and EGFr and HBA1C 5 days before next visit   It is important that you exercise regularly at least 30 minutes 5 times a week. If you develop chest pain, have severe difficulty breathing, or feel very tired, stop exercising immediately and seek medical attention    Take ibuprofen twice daily for 3 days  for right flank pain, if persists pls message I will order [place]  List is as printed  Thanks for choosing [facility], we consider it a privelige to serve you.

## 2021-02-17 NOTE — Assessment & Plan Note (Signed)
Annual exam as documented. Counseling done  re healthy lifestyle involving commitment to 150 minutes exercise per week, heart healthy diet, and attaining healthy weight.The importance of adequate sleep also discussed. Changes in health habits are decided on by the patient with goals and time frames  set for achieving them. Immunization and cancer screening needs are specifically addressed at this visit. 

## 2021-02-17 NOTE — Progress Notes (Signed)
° ° °  Denise Werner     MRN: [id number]      DOB: May 25, 1955  HPI: Patient is in for annual physical exam. Reproducible right flank pain x 2 days, worse with deep breath, had been out riding approx 2 hours yesterday Recent labs,  are reviewed. Immunization is reviewed , and  updated .   PE: BP 136/76    Pulse 89    Ht 5\' 6"  (1.676 m)    Wt 156 lb (70.8 kg)    SpO2 98%    BMI 25.18 kg/m   Pleasant  female, alert and oriented x 3, in no cardio-pulmonary distress. Afebrile. HEENT No facial trauma or asymetry. Sinuses non tender.  Extra occullar muscles intact.. External ears normal, . Neck: supple, no adenopathy,JVD or thyromegaly.No bruits.  Chest: Clear to ascultation bilaterally.No crackles or wheezes. Non tender to palpation  Cardiovascular system; Heart sounds normal,  S1 and  S2 ,no S3.  No murmur, or thrill. al.  Abdomen: Soft, non tender,.      Musculoskeletal exam: Full ROM of spine, hips , shoulders and knees. No deformity ,swelling or crepitus noted. No muscle wasting or atrophy.   Neurologic: Cranial nerves 2 to 12 intact. Power, tone ,sensation  normal throughout. No disturbance in gait. No tremor.  Skin: Intact, no ulceration, erythema , scaling or rash noted. Pigmentation normal throughout  Psych; Normal mood and affect. Judgement and concentration normal   Assessment & Plan:  Annual physical exam Annual exam as documented. Counseling done  re healthy lifestyle involving commitment to 150 minutes exercise per week, heart healthy diet, and attaining healthy weight.The importance of adequate sleep also discussed. Changes in health habits are decided on by the patient with goals and time frames  set for achieving them. Immunization and cancer screening needs are specifically addressed at this visit.

## 2021-02-19 ENCOUNTER — Ambulatory Visit (INDEPENDENT_AMBULATORY_CARE_PROVIDER_SITE_OTHER): Payer: BC Managed Care – PPO

## 2021-02-19 DIAGNOSIS — J309 Allergic rhinitis, unspecified: Secondary | ICD-10-CM | POA: Diagnosis not present

## 2021-03-01 ENCOUNTER — Encounter: Payer: Self-pay | Admitting: Internal Medicine

## 2021-03-01 ENCOUNTER — Encounter: Payer: Self-pay | Admitting: *Deleted

## 2021-03-01 ENCOUNTER — Encounter: Payer: Self-pay | Admitting: Family Medicine

## 2021-03-01 ENCOUNTER — Ambulatory Visit (INDEPENDENT_AMBULATORY_CARE_PROVIDER_SITE_OTHER): Payer: BC Managed Care – PPO | Admitting: Internal Medicine

## 2021-03-01 ENCOUNTER — Other Ambulatory Visit: Payer: Self-pay

## 2021-03-01 DIAGNOSIS — J0110 Acute frontal sinusitis, unspecified: Secondary | ICD-10-CM | POA: Diagnosis not present

## 2021-03-01 MED ORDER — AMOXICILLIN-POT CLAVULANATE 875-125 MG PO TABS: 1.0000 | ORAL_TABLET | Freq: Two times a day (BID) | ORAL | 0 refills | Status: DC

## 2021-03-01 MED ORDER — NOREL AD 4-10-325 MG PO TABS: 1.0000 | ORAL_TABLET | Freq: Three times a day (TID) | ORAL | 0 refills | Status: DC | PRN

## 2021-03-01 NOTE — Patient Instructions (Signed)
Please take Norel for nasal congestion.  If your symptoms don't start improving in 2 days, please start taking Augmentin.

## 2021-03-01 NOTE — Progress Notes (Signed)
Virtual Visit via Telephone Note   This visit type was conducted due to national recommendations for restrictions regarding the COVID-19 Pandemic (e.g. social distancing) in an effort to limit this patient's exposure and mitigate transmission in our community.  Due to her co-morbid illnesses, this patient is at least at moderate risk for complications without adequate follow up.  This format is felt to be most appropriate for this patient at this time.  The patient did not have access to video technology/had technical difficulties with video requiring transitioning to audio format only (telephone).  All issues noted in this document were discussed and addressed.  No physical exam could be performed with this format.  Evaluation Performed:  Follow-up visit  Date:  03/01/2021   ID:  Denise Werner, Denise Werner 22-Jun-1955, MRN [id number]  Patient Location: Home Provider Location: Office/Clinic  Participants: Patient Location of Patient: Home Location of Provider: Telehealth Consent was obtain for visit to be over via telehealth. I verified that [name] using two identifiers.  PCP:  Fayrene Helper, MD   Chief Complaint: Cough, sore throat and nasal congestion  History of Present Illness:    Denise Werner is a 66 y.o. female who has a televisit for complaint of cough, sore throat, nasal congestion and postnasal drip with sinus pressure related headache for last 3 days.  Her home COVID test was negative twice.  She denies any fever, chills or recent worsening of dyspnea or wheezing.  Of note, she has a history of asthma and uses Trelegy and as needed albuterol for it.  She also gets allergy shots at allergy clinic.  The patient does not have symptoms concerning for COVID-19 infection (fever, chills, cough, or new shortness of breath).   Past Medical, Surgical, Social History, Allergies, and Medications have been Reviewed.  Past Medical History:   Diagnosis Date   Asthma    Bladder pain    Chronic neck pain    Dyslipidemia    Frequency of urination    GERD (gastroesophageal reflux disease) OCCASIONAL   H/O hiatal hernia    Hyperlipidemia    Phreesia 01/04/2020   Hypertension    Phreesia 01/04/2020   Interstitial cystitis    Nocturia    Short of breath on exertion    Urgency of urination    Past Surgical History:  Procedure Laterality Date   ABDOMINAL HYSTERECTOMY     W/ BILATERAL SALPINGOOPHECTOMY   APPENDECTOMY     BIOPSY  04/11/2018   Procedure: BIOPSY;  Surgeon: Rogene Houston, MD;  Location: [facility];  Service: Endoscopy;;  colon polyp with biopsy forceps   BUNIONECTOMY  04-14-2003   LEFT FOOT  W/ POST REMOVAL INTERNAL FIXATION DEVICE  VIA SILVER BUNIONECTOMY   CHOLECYSTECTOMY  1998   COLONOSCOPY N/A 04/11/2018   Procedure: COLONOSCOPY;  Surgeon: Rogene Houston, MD;  Location: [facility];  Service: Endoscopy;  Laterality: N/A;  730   CYSTO WITH HYDRODISTENSION  09/12/2011   Procedure: CYSTOSCOPY/HYDRODISTENSION;  Surgeon: Ailene Rud, MD;  Location: [facility];  Service: Urology;  Laterality: N/A;  INSTILL M & P AND M & P   CYSTO/ HOD/ INSTILLATION THERAPY  11-17-2008;   12-02-1999   INTERSTITIAL CYSTITIS   ESOPHAGOGASTRODUODENOSCOPY N/A 05/01/2013   Procedure: ESOPHAGOGASTRODUODENOSCOPY (EGD);  Surgeon: Rogene Houston, MD;  Location: [facility];  Service: Endoscopy;  Laterality: N/A;  100   FINGER SURGERY  10-13-2009  left 5th finger ARTHRODESIS   FOOT ARTHROPLASTY  05-03-2004   FIFTHE TOE RIGHT FOOT   KNEE ARTHROSCOPY  JAN 2013   LEFT KNEE     Current Meds  Medication Sig   albuterol (VENTOLIN HFA) 108 (90 Base) MCG/ACT inhaler Inhale 2 puffs into the lungs every 6 (six) hours as needed for wheezing or shortness of breath.   amLODipine (NORVASC) 5 MG tablet TAKE 1 TABLET BY MOUTH EVERY DAY   Calcium Carbonate-Vit D-Min (CALCIUM 1200) 1200-1000 MG-UNIT CHEW Chew 2  tablets by mouth daily.   Carbinoxamine Maleate ER [facility]) 4 MG/5ML SUER Take 4 mg by mouth 2 (two) times daily as needed.   Fluticasone Propionate (XHANCE) 93 MCG/ACT EXHU Place 2 puffs into the nose in the morning and at bedtime.   Fluticasone-Umeclidin-Vilant (TRELEGY ELLIPTA) 100-62.5-25 MCG/INH AEPB Inhale 1 puff into the lungs daily.   ibuprofen (ADVIL) 600 MG tablet Take one tablet by mouth every 8 hours as needed for  Back and left knee pain   levalbuterol (XOPENEX) 1.25 MG/3ML nebulizer solution Take 1.25 mg by nebulization every 4 (four) hours as needed for wheezing.   pantoprazole (PROTONIX) 20 MG tablet Take 1 tablet (20 mg total) by mouth daily.   pantoprazole (PROTONIX) 20 MG tablet Take 1 tablet (20 mg total) by mouth daily.   Polyvinyl Alcohol-Povidone (REFRESH OP) Place 1 drop into both eyes 2 (two) times daily.   zoledronic acid (RECLAST) 5 MG/100ML SOLN injection Inject 5 mg into the vein.     Allergies:   Hydrocodone, Other, and Sulfonamide derivatives   ROS:   Please see the history of present illness.     All other systems reviewed and are negative.   Labs/Other Tests and Data Reviewed:    Recent Labs: 02/12/2021: ALT 33; BUN 13; Creatinine, Ser 0.85; Hemoglobin 11.8; Platelets 254; Potassium 4.6; Sodium 139; TSH 2.970   Recent Lipid Panel Lab Results  Component Value Date/Time   CHOL 136 02/12/2021 09:12 AM   TRIG 31 02/12/2021 09:12 AM   HDL 55 02/12/2021 09:12 AM   CHOLHDL 2.5 02/12/2021 09:12 AM   CHOLHDL 3.6 08/30/2019 08:56 AM   LDLCALC 73 02/12/2021 09:12 AM   LDLCALC 141 (H) 08/30/2019 08:56 AM    Wt Readings from Last 3 Encounters:  02/16/21 156 lb (70.8 kg)  08/04/20 150 lb (68 kg)  01/08/20 154 lb 6.4 oz (70 kg)     ASSESSMENT & PLAN:    Acute sinusitis Started Norel as needed Continue Trelegy and as needed albuterol for asthma Advised to start taking Augmentin if her symptoms are persistent after 2 days Continue to use  Xhance  Time:   Today, I have spent 9 minutes reviewing the chart, including problem list, medications, and with the patient with telehealth technology discussing the above problems.   Medication Adjustments/Labs and Tests Ordered: Current medicines are reviewed at length with the patient today.  Concerns regarding medicines are outlined above.   Tests Ordered: No orders of the defined types were placed in this encounter.   Medication Changes: No orders of the defined types were placed in this encounter.    Note: This dictation was prepared with Dragon dictation along with smaller phrase technology. Similar sounding words can be transcribed inadequately or may not be corrected upon review. Any transcriptional errors that result from this process are unintentional.      Disposition:  Follow up  Signed, Lindell Spar, MD  03/01/2021 12:05 PM  [facility]

## 2021-03-08 ENCOUNTER — Ambulatory Visit
Admission: RE | Admit: 2021-03-08 | Discharge: 2021-03-08 | Disposition: A | Discharge status: Home / Self Care | Payer: BC Managed Care – PPO | Source: Ambulatory Visit | Attending: Family Medicine | Admitting: Family Medicine

## 2021-03-08 DIAGNOSIS — Z1231 Encounter for screening mammogram for malignant neoplasm of breast: Secondary | ICD-10-CM

## 2021-04-02 ENCOUNTER — Ambulatory Visit (INDEPENDENT_AMBULATORY_CARE_PROVIDER_SITE_OTHER): Payer: BC Managed Care – PPO

## 2021-04-02 DIAGNOSIS — J309 Allergic rhinitis, unspecified: Secondary | ICD-10-CM

## 2021-04-09 ENCOUNTER — Ambulatory Visit (INDEPENDENT_AMBULATORY_CARE_PROVIDER_SITE_OTHER): Payer: BC Managed Care – PPO

## 2021-04-09 DIAGNOSIS — J309 Allergic rhinitis, unspecified: Secondary | ICD-10-CM

## 2021-04-12 ENCOUNTER — Encounter: Payer: Self-pay | Admitting: Family Medicine

## 2021-04-13 ENCOUNTER — Ambulatory Visit: Payer: BC Managed Care – PPO | Admitting: Family Medicine

## 2021-04-13 ENCOUNTER — Other Ambulatory Visit: Payer: Self-pay

## 2021-04-13 ENCOUNTER — Encounter: Payer: Self-pay | Admitting: Family Medicine

## 2021-04-13 DIAGNOSIS — J42 Unspecified chronic bronchitis: Secondary | ICD-10-CM | POA: Diagnosis not present

## 2021-04-13 DIAGNOSIS — J329 Chronic sinusitis, unspecified: Secondary | ICD-10-CM | POA: Diagnosis not present

## 2021-04-13 DIAGNOSIS — J45991 Cough variant asthma: Secondary | ICD-10-CM

## 2021-04-13 MED ORDER — PROMETHAZINE-DM 6.25-15 MG/5ML PO SYRP: ORAL_SOLUTION | ORAL | 0 refills | Status: DC

## 2021-04-13 MED ORDER — AZITHROMYCIN 250 MG PO TABS: ORAL_TABLET | ORAL | 0 refills | Status: AC

## 2021-04-13 NOTE — Patient Instructions (Signed)
F/U in 3 weeks, call if you need me sooner. ? ?Z pack and phenergan DM are prescribed for sinusitis/ bronchitis and cough, no imaging studies are being ordered at this time ? ?Please submit sputum for culture as soon as possible ? ?You are treated for chronic sinusitis and bronchitis ? ?Thanks for choosing [facility], we consider it a privelige to serve you. ? ?

## 2021-04-13 NOTE — Telephone Encounter (Signed)
Called patient left voice mail to call office to schedule an appt. ?

## 2021-04-13 NOTE — Progress Notes (Signed)
Virtual Visit via Telephone Note ? ?I connected with Denise Werner on 04/13/21 at  1:00 PM EDT by telephone and verified that [name] using two identifiers. ? ?Location: ?Patient: HOME  ?Provider: OFFICE ?  ?I discussed the limitations, risks, security and privacy concerns of performing an evaluation and management service by telephone and the availability of in person appointments. I also discussed with the patient that there may be a patient responsible charge related to this service. The patient expressed understanding and agreed to proceed. ? ? ?History of Present Illness: ?1 MONTH H/O POST NASAL DRAINAGE, THICK YELLOW/ GREEN, COUGH SOME SPUTUM THE SAME, FATIGUE, NO FEVER OR CHILLS. HAS ONLY TAKEN OTC MEDS WAS ON AUGMENTIN X 2 WEEKS THE FIRST 2 WEEKS OF FEB ?  ?Observations/Objective: ? ?There were no vitals taken for this visit. ?Good communication with no confusion and intact memory. ?Alert and oriented x 3 ?No signs of respiratory distress during speech ? ?Assessment and Plan: ?Chronic sinusitis ?Sputum/ drainage to be sent for c/s , z paxk prescribed, no imaging at this time ? ?Chronic bronchitis ([place]) ?Z pack and phenergan DM prescribed, sputum for c/S  ? ? ?Follow Up Instructions: ? ?  ?I discussed the assessment and treatment plan with the patient. The patient was provided an opportunity to ask questions and all were answered. The patient agreed with the plan and demonstrated an understanding of the instructions. ?  ?The patient was advised to call back or seek an in-person evaluation if the symptoms worsen or if the condition fails to improve as anticipated. ? ?I provided 11 minutes of non-face-to-face time during this encounter. ? ? ?Tula Nakayama, MD ? ?

## 2021-04-18 LAB — SPUTUM CULTURE

## 2021-04-18 LAB — GRAM STAIN W/SPUTUM CULT RFLX

## 2021-04-19 ENCOUNTER — Encounter: Payer: Self-pay | Admitting: Family Medicine

## 2021-04-19 DIAGNOSIS — J42 Unspecified chronic bronchitis: Secondary | ICD-10-CM | POA: Insufficient documentation

## 2021-04-19 NOTE — Assessment & Plan Note (Signed)
Z pack and phenergan DM prescribed, sputum for c/S  ?

## 2021-04-19 NOTE — Assessment & Plan Note (Signed)
Sputum/ drainage to be sent for c/s , z paxk prescribed, no imaging at this time ?

## 2021-04-21 ENCOUNTER — Ambulatory Visit (INDEPENDENT_AMBULATORY_CARE_PROVIDER_SITE_OTHER): Payer: BC Managed Care – PPO

## 2021-04-21 DIAGNOSIS — J309 Allergic rhinitis, unspecified: Secondary | ICD-10-CM

## 2021-04-26 ENCOUNTER — Other Ambulatory Visit: Payer: Self-pay | Admitting: Family Medicine

## 2021-05-05 ENCOUNTER — Ambulatory Visit: Payer: BC Managed Care – PPO | Admitting: Family Medicine

## 2021-05-17 ENCOUNTER — Encounter: Payer: Self-pay | Admitting: Family Medicine

## 2021-05-20 NOTE — Telephone Encounter (Signed)
Work in on schedule 4/26, patient needs soon appt. ?

## 2021-05-20 NOTE — Telephone Encounter (Signed)
I have her moved to tomorrow ?

## 2021-05-21 ENCOUNTER — Encounter: Payer: Self-pay | Admitting: Family Medicine

## 2021-05-21 ENCOUNTER — Ambulatory Visit: Payer: BC Managed Care – PPO | Admitting: Family Medicine

## 2021-05-21 ENCOUNTER — Ambulatory Visit (HOSPITAL_COMMUNITY)
Admission: RE | Admit: 2021-05-21 | Discharge: 2021-05-21 | Disposition: A | Discharge status: Home / Self Care | Payer: BC Managed Care – PPO | Source: Ambulatory Visit | Attending: Family Medicine | Admitting: Family Medicine

## 2021-05-21 VITALS — BP 138/68 | HR 77 | Ht 66.0 in | Wt 152.1 lb

## 2021-05-21 DIAGNOSIS — I10 Essential (primary) hypertension: Secondary | ICD-10-CM

## 2021-05-21 DIAGNOSIS — M546 Pain in thoracic spine: Secondary | ICD-10-CM | POA: Insufficient documentation

## 2021-05-21 DIAGNOSIS — R109 Unspecified abdominal pain: Secondary | ICD-10-CM | POA: Insufficient documentation

## 2021-05-21 MED ORDER — METHYLPREDNISOLONE ACETATE 80 MG/ML IJ SUSP
80.0000 mg | Freq: Once | INTRAMUSCULAR | Status: AC
  Administered 2021-05-21: 80 mg via INTRAMUSCULAR

## 2021-05-21 MED ORDER — PREDNISONE 20 MG PO TABS: 20.0000 mg | ORAL_TABLET | Freq: Two times a day (BID) | ORAL | 0 refills | Status: DC

## 2021-05-21 MED ORDER — KETOROLAC TROMETHAMINE 60 MG/2ML IM SOLN
60.0000 mg | Freq: Once | INTRAMUSCULAR | Status: AC
  Administered 2021-05-21: 60 mg via INTRAMUSCULAR

## 2021-05-21 NOTE — Progress Notes (Signed)
? ?  Denise Werner     MRN: [id number]      DOB: 10/22/1955 ? ? ?HPI ?Denise Werner is here with a 1 month h/o daily pain rated at 5 from right flank, down right paraspinal area , radiating ito right groin and right upper thigh, mid level anteriorly. No aggravating or relieving factor noted. Denies urinay burning or hematuria. No change in appetite , nausea or vomiting ?Has known spine disease. No relationship to food , no change in appetite ?No new lower ext weakness, numbness or incontinence ?ROS ?Denies recent fever or chills. ?Denies sinus pressure, nasal congestion, ear pain or sore throat. ?Denies chest congestion, productive cough or wheezing. ?Denies chest pains, palpitations and leg swelling ?Denies abdominal pain, nausea, vomiting,diarrhea or constipation.   ?Denies dysuria, frequency, hesitancy or incontinence. ? ?Denies depression or  anxiety  ?Denies skin break down or rash. ? ? ?PE ? ?BP 138/68   Pulse 77   Ht '5\' 6"'$  (1.676 m)   Wt 152 lb 1.9 oz (69 kg)   SpO2 97%   BMI 24.55 kg/m?  ? ?Patient alert and oriented and in no cardiopulmonary distress. ? ?HEENT: No facial asymmetry, EOMI,     Neck supple . ? ?Chest: Clear to auscultation bilaterally. ? ?CVS: S1, S2 no murmurs, no S3.Regular rate. ? ?ABD: Soft right flank tenderness ?Ext: No edema ? ?MS: decreased  ROM lumbar spine, adequate in shoulders, hips and knees. ? ?Skin: Intact, no ulcerations or rash noted. ? ?Psych: Good eye contact, normal affect. Memory intact not anxious or depressed appearing. ? ?CNS: CN 2-12 intact, power,  normal throughout.no focal deficits noted. ? ? ?Assessment & Plan ? ?Thoracic spine pain ?4 week h/o increased pain radiating to anterior right thigh, unprovoked  ?Uncontrolled.Toradol and depo medrol administered IM in the office , to be followed by a short course of oral prednisone , alos needs x ray of thoracic spine ? ? ?HTN (hypertension) ?Elevated outside of target range, no med change, work on exercise and stress  reduction ?DASH diet and commitment to daily physical activity for a minimum of 30 minutes discussed and encouraged, as a part of hypertension management. ?The importance of attaining a healthy weight is also discussed. ? ? ?  05/21/2021  ? 10:28 AM 02/16/2021  ?  9:07 AM 09/01/2020  ? 11:22 AM 08/04/2020  ?  2:47 PM 05/22/2020  ?  2:47 PM 03/18/2020  ?  1:12 PM 01/08/2020  ? 10:06 AM  ?BP/Weight  ?Systolic BP 361 443 154 008 122 141   ?Diastolic BP 68 76 72 80 88 76   ?Wt. (Lbs) 152.12 156  150   154.4  ?BMI 24.55 kg/m2 25.18 kg/m2  24.21 kg/m2   25.11 kg/m2  ? ? ? ? ? ?Flank pain ?Right flank pain , constant and chronic hTN, obtain renal US ? ?

## 2021-05-21 NOTE — Patient Instructions (Signed)
F/u as before, call if you need me sooner ? ?X ray of upper back today please ? ?We will call you re appt for [place] of kidneys, we will schedule for a Monday through Wednesday if possible ? ?Toradol 60 mg and depo medrol 80 mg iM in office today for back and right thigh pain, and 5 day course of prednisone is prescribed ? ?Blood pressure is slightly high, try to work on stress reduction and start regular exercise  ? ?Thanks for choosing [facility], we consider it a privelige to serve you. ? ?Please get fasting labs ordered 1 week before your July appt ?

## 2021-05-23 ENCOUNTER — Encounter: Payer: Self-pay | Admitting: Family Medicine

## 2021-05-23 NOTE — Assessment & Plan Note (Signed)
Elevated outside of target range, no med change, work on exercise and stress reduction ?DASH diet and commitment to daily physical activity for a minimum of 30 minutes discussed and encouraged, as a part of hypertension management. ?The importance of attaining a healthy weight is also discussed. ? ? ?  05/21/2021  ? 10:28 AM 02/16/2021  ?  9:07 AM 09/01/2020  ? 11:22 AM 08/04/2020  ?  2:47 PM 05/22/2020  ?  2:47 PM 03/18/2020  ?  1:12 PM 01/08/2020  ? 10:06 AM  ?BP/Weight  ?Systolic BP 242 683 419 622 122 141   ?Diastolic BP 68 76 72 80 88 76   ?Wt. (Lbs) 152.12 156  150   154.4  ?BMI 24.55 kg/m2 25.18 kg/m2  24.21 kg/m2   25.11 kg/m2  ? ? ? ? ?

## 2021-05-23 NOTE — Assessment & Plan Note (Signed)
4 week h/o increased pain radiating to anterior right thigh, unprovoked  ?Uncontrolled.Toradol and depo medrol administered IM in the office , to be followed by a short course of oral prednisone , alos needs x ray of thoracic spine ? ?

## 2021-05-23 NOTE — Assessment & Plan Note (Signed)
Right flank pain , constant and chronic hTN, obtain renal US ?

## 2021-05-25 ENCOUNTER — Other Ambulatory Visit: Payer: Self-pay | Admitting: Family Medicine

## 2021-05-26 ENCOUNTER — Ambulatory Visit: Payer: BC Managed Care – PPO | Admitting: Family Medicine

## 2021-05-28 ENCOUNTER — Ambulatory Visit (INDEPENDENT_AMBULATORY_CARE_PROVIDER_SITE_OTHER): Payer: BC Managed Care – PPO

## 2021-05-28 DIAGNOSIS — J309 Allergic rhinitis, unspecified: Secondary | ICD-10-CM | POA: Diagnosis not present

## 2021-06-09 ENCOUNTER — Ambulatory Visit: Payer: BC Managed Care – PPO | Admitting: Family Medicine

## 2021-06-10 ENCOUNTER — Other Ambulatory Visit (HOSPITAL_COMMUNITY): Payer: BC Managed Care – PPO

## 2021-06-11 ENCOUNTER — Ambulatory Visit (INDEPENDENT_AMBULATORY_CARE_PROVIDER_SITE_OTHER): Payer: BC Managed Care – PPO

## 2021-06-11 DIAGNOSIS — J309 Allergic rhinitis, unspecified: Secondary | ICD-10-CM

## 2021-06-14 ENCOUNTER — Ambulatory Visit (HOSPITAL_COMMUNITY): Payer: BC Managed Care – PPO

## 2021-06-30 ENCOUNTER — Ambulatory Visit (INDEPENDENT_AMBULATORY_CARE_PROVIDER_SITE_OTHER): Payer: BC Managed Care – PPO

## 2021-06-30 DIAGNOSIS — J309 Allergic rhinitis, unspecified: Secondary | ICD-10-CM

## 2021-07-19 ENCOUNTER — Ambulatory Visit (HOSPITAL_COMMUNITY): Payer: BC Managed Care – PPO

## 2021-07-20 ENCOUNTER — Ambulatory Visit (HOSPITAL_COMMUNITY)
Admission: RE | Admit: 2021-07-20 | Discharge: 2021-07-20 | Disposition: A | Discharge status: Home / Self Care | Payer: Medicare Other | Source: Ambulatory Visit | Attending: Family Medicine | Admitting: Family Medicine

## 2021-07-20 DIAGNOSIS — R109 Unspecified abdominal pain: Secondary | ICD-10-CM | POA: Insufficient documentation

## 2021-07-23 ENCOUNTER — Ambulatory Visit (INDEPENDENT_AMBULATORY_CARE_PROVIDER_SITE_OTHER): Payer: BC Managed Care – PPO

## 2021-07-23 DIAGNOSIS — J309 Allergic rhinitis, unspecified: Secondary | ICD-10-CM

## 2021-08-06 ENCOUNTER — Ambulatory Visit (INDEPENDENT_AMBULATORY_CARE_PROVIDER_SITE_OTHER): Payer: BC Managed Care – PPO

## 2021-08-06 DIAGNOSIS — J309 Allergic rhinitis, unspecified: Secondary | ICD-10-CM

## 2021-08-12 NOTE — Progress Notes (Signed)
Cardiology Office Note    Date:  08/13/2021   ID:  Tandra, Rosado 1955/03/14, MRN [id number]  PCP:  Fayrene Helper, MD  Cardiologist: Dorris Carnes, MD    Chief Complaint  Patient presents with   Follow-up    Lower extremity edema    History of Present Illness:    Denise Werner is a 66 y.o. female with past medical history of HTN, HLD and chronic neck pain who presents to the office today for evaluation of lower extremity edema.  She was last examined by Dr. Harrington Challenger in 11/2019 and reported worsening exertional fatigue and dyspnea on exertion for the past few months. An echocardiogram was performed initially and showed a preserved EF of 65 to 70% with no regional wall motion abnormalities. She did have trivial MR but no significant valve abnormalities. A Coronary CT was also performed for further evaluation and showed evidence of aortic atherosclerosis but a coronary calcium score of 0. Her symptoms were overall felt to be noncardiac.  In talking with the patient today, she reports having occasional episodes of shooting chest pain which last for a few seconds and spontaneously resolve. The episodes typically occur at rest. She goes to the [facility] several days a week and walks on the treadmill and is unaware of any exertional chest pain or dyspnea on exertion at that time. She does have asthma and uses her inhaler as needed. Denies any specific orthopnea or PND. She does notice swelling along her ankles bilaterally. While she is retired, she still works at a [employer] a few days each week.   Past Medical History:  Diagnosis Date   Asthma    Bladder pain    Chronic neck pain    Dyslipidemia    Frequency of urination    GERD (gastroesophageal reflux disease) OCCASIONAL   H/O hiatal hernia    Hyperlipidemia    Phreesia 01/04/2020   Hypertension    Phreesia 01/04/2020   Interstitial cystitis    Nocturia    Short of breath on exertion    Urgency of urination     Past  Surgical History:  Procedure Laterality Date   ABDOMINAL HYSTERECTOMY     W/ BILATERAL SALPINGOOPHECTOMY   APPENDECTOMY     BIOPSY  04/11/2018   Procedure: BIOPSY;  Surgeon: Rogene Houston, MD;  [facility];  Service: Endoscopy;;  colon polyp with biopsy forceps   BUNIONECTOMY  04-14-2003   LEFT FOOT  W/ POST REMOVAL INTERNAL FIXATION DEVICE  VIA SILVER BUNIONECTOMY   CHOLECYSTECTOMY  1998   COLONOSCOPY N/A 04/11/2018   Procedure: COLONOSCOPY;  Surgeon: Rogene Houston, MD;  [facility];  Service: Endoscopy;  Laterality: N/A;  730   CYSTO WITH HYDRODISTENSION  09/12/2011   Procedure: CYSTOSCOPY/HYDRODISTENSION;  Surgeon: Ailene Rud, MD;  Location[facility];  Service: Urology;  Laterality: N/A;  INSTILL M & P AND M & P   CYSTO/ HOD/ INSTILLATION THERAPY  11-17-2008;   12-02-1999   INTERSTITIAL CYSTITIS   ESOPHAGOGASTRODUODENOSCOPY N/A 05/01/2013   Procedure: ESOPHAGOGASTRODUODENOSCOPY (EGD);  Surgeon: Rogene Houston, MD;  [facility];  Service: Endoscopy;  Laterality: N/A;  100   FINGER SURGERY  10-13-2009   left 5th finger ARTHRODESIS   FOOT ARTHROPLASTY  05-03-2004   FIFTHE TOE RIGHT FOOT   KNEE ARTHROSCOPY  JAN 2013   LEFT KNEE    Current Medications: Outpatient Medications Prior to Visit  Medication Sig  Dispense Refill   albuterol (VENTOLIN HFA) 108 (90 Base) MCG/ACT inhaler Inhale 2 puffs into the lungs every 6 (six) hours as needed for wheezing or shortness of breath. 18 g 1   amLODipine (NORVASC) 5 MG tablet TAKE 1 TABLET BY MOUTH EVERY DAY 90 tablet 1   Calcium Carbonate-Vit D-Min (CALCIUM 1200) 1200-1000 MG-UNIT CHEW Chew 2 tablets by mouth daily.     Fluticasone-Umeclidin-Vilant (TRELEGY ELLIPTA) 100-62.5-25 MCG/INH AEPB Inhale 1 puff into the lungs daily. 28 each 5   ibuprofen (ADVIL) 600 MG tablet TAKE ONE TABLET BY MOUTH EVERY 8 HOURS AS NEEDED FOR BACK AND LEFT KNEE PAIN 30 tablet 0   pantoprazole (PROTONIX)  20 MG tablet Take 1 tablet (20 mg total) by mouth daily. 90 tablet 3   zoledronic acid (RECLAST) 5 MG/100ML SOLN injection Inject 5 mg into the vein.     Fluticasone Propionate (XHANCE) 93 MCG/ACT EXHU Place 2 puffs into the nose in the morning and at bedtime. (Patient not taking: Reported on 08/13/2021) 16 mL 3   levalbuterol (XOPENEX) 1.25 MG/3ML nebulizer solution Take 1.25 mg by nebulization every 4 (four) hours as needed for wheezing. (Patient not taking: Reported on 08/13/2021) 72 mL 3   predniSONE (DELTASONE) 20 MG tablet Take 1 tablet (20 mg total) by mouth 2 (two) times daily with a meal. (Patient not taking: Reported on 08/13/2021) 10 tablet 0   rosuvastatin (CRESTOR) 10 MG tablet TAKE 1 TABLET BY MOUTH EVERY DAY (Patient not taking: Reported on 08/13/2021) 90 tablet 1   metoprolol tartrate (LOPRESSOR) 100 MG tablet Take 1 tablet (100 mg total) by mouth once for 1 dose. Please take two hours prior to CT 1 tablet 0   No facility-administered medications prior to visit.     Allergies:   Hydrocodone, Other, and Sulfonamide derivatives   Social History   Socioeconomic History   Marital status: Married    Spouse name: Not on file   Number of children: Not on file   Years of education: Not on file   Highest education level: Not on file  Occupational History   Not on file  Tobacco Use   Smoking status: Never   Smokeless tobacco: Never  Vaping Use   Vaping Use: Never used  Substance and Sexual Activity   Alcohol use: No    Alcohol/week: 0.0 standard drinks of alcohol   Drug use: No   Sexual activity: Yes    Birth control/protection: Surgical  Other Topics Concern   Not on file  Social History Narrative   Not on file   Social Determinants of Health   Financial Resource Strain: Not on file  Food Insecurity: Not on file  Transportation Needs: Not on file  Physical Activity: Not on file  Stress: Not on file  Social Connections: Not on file     Family History:  The patient's  family history includes Aneurysm in her sister; Cancer in her brother; Hypertension in her sister; Lupus in her sister.   Review of Systems:    Please see the history of present illness.     All other systems reviewed and are otherwise negative except as noted above.   Physical Exam:    VS:  BP 116/78   Pulse 76   Ht '5\' 6"'$  (1.676 m)   Wt 157 lb (71.2 kg)   SpO2 97%   BMI 25.34 kg/m    General: Well developed, well nourished,female appearing in no acute distress. Head: Normocephalic, atraumatic. Neck: No carotid  bruits. JVD not elevated.  Lungs: Respirations regular and unlabored, without wheezes or rales.  Heart: Regular rate and rhythm. No S3 or S4.  No murmur, no rubs, or gallops appreciated. Abdomen: Appears non-distended. No obvious abdominal masses. Msk:  Strength and tone appear normal for age. No obvious joint deformities or effusions. Extremities: No clubbing or cyanosis. No pitting edema.  Distal pedal pulses are 2+ bilaterally. Neuro: Alert and oriented X 3. Moves all extremities spontaneously. No focal deficits noted. Psych:  Responds to questions appropriately with a normal affect. Skin: No rashes or lesions noted  Wt Readings from Last 3 Encounters:  08/13/21 157 lb (71.2 kg)  05/21/21 152 lb 1.9 oz (69 kg)  02/16/21 156 lb (70.8 kg)     Studies/Labs Reviewed:   EKG:  EKG is ordered today. The ekg ordered today demonstrates NSR, HR 75 with nonspecific ST abnormality along the inferior leads which is noted on prior tracings. No acute changes.   Recent Labs: 02/12/2021: ALT 33; BUN 13; Creatinine, Ser 0.85; Hemoglobin 11.8; Platelets 254; Potassium 4.6; Sodium 139; TSH 2.970   Lipid Panel    Component Value Date/Time   CHOL 136 02/12/2021 0912   TRIG 31 02/12/2021 0912   HDL 55 02/12/2021 0912   CHOLHDL 2.5 02/12/2021 0912   CHOLHDL 3.6 08/30/2019 0856   VLDL 10 06/22/2016 0831   LDLCALC 73 02/12/2021 0912   LDLCALC 141 (H) 08/30/2019 0856     Additional studies/ records that were reviewed today include:   Echocardiogram: 11/2019 IMPRESSIONS     1. Left ventricular ejection fraction, by estimation, is 65 to 70%. The  left ventricle has normal function. The left ventricle has no regional  wall motion abnormalities. Left ventricular diastolic parameters were  normal.   2. Right ventricular systolic function is normal. The right ventricular  size is normal. Tricuspid regurgitation signal is inadequate for assessing  PA pressure.   3. The mitral valve is grossly normal. Trivial mitral valve  regurgitation.   4. The aortic valve is tricuspid. Aortic valve regurgitation is not  visualized.   5. The inferior vena cava is normal in size with greater than 50%  respiratory variability, suggesting right atrial pressure of 3 mmHg.   Coronary CT: 03/2020 Coronary Arteries:  Normal coronary origin.  Right dominance.   Left main: The left main is a large caliber vessel with a normal take off from the left coronary cusp that bifurcates to form a left anterior descending artery and a left circumflex artery. There is no plaque or stenosis.   Left anterior descending artery: The LAD is patent without evidence of plaque or stenosis. The LAD gives off 1 patent diagonal branch.   Left circumflex artery: The LCX is non-dominant and patent with no evidence of plaque or stenosis. The LCX gives off 2 patent obtuse marginal branches.   Right coronary artery: The RCA is dominant with normal take off from the right coronary cusp. There is no evidence of plaque or stenosis. The RCA terminates as a PDA and right posterolateral branch without evidence of plaque or stenosis.   Right Atrium: Right atrial size is within normal limits.   Right Ventricle: The right ventricular cavity is within normal limits.   Left Atrium: Left atrial size is normal in size with no left atrial appendage filling defect.   Left Ventricle: The ventricular  cavity size is within normal limits. There are no stigmata of prior infarction. There is no abnormal filling defect.  Pulmonary arteries: Normal in size without proximal filling defect.   Pulmonary veins: Normal pulmonary venous drainage.   Pericardium: Normal thickness with no significant effusion or calcium present.   Cardiac valves: The aortic valve is trileaflet without significant calcification. The mitral valve is normal structure without significant calcification.   Aorta: Normal caliber with no significant disease.   Extra-cardiac findings: See attached radiology report for non-cardiac structures.   IMPRESSION: 1. Coronary calcium score of 0.   2. Normal coronary origin with right dominance.   3. Mild cardiac motion artifact, but the coronary arteries appear normal.   RECOMMENDATIONS: 1. No evidence of CAD (0%). Consider non-atherosclerotic causes of chest pain.    Assessment:    1. Bilateral lower extremity edema   2. Atypical chest pain   3. Essential hypertension   4. Hyperlipidemia LDL goal <100      Plan:   In order of problems listed above:  1. Lower Extremity Edema - She reports edema is typically isolated to her ankles and she does not have any evidence of pitting edema on examination today. I suspect her symptoms are secondary to dependent edema as she is on her feet a lot throughout the day. She limits her sodium intake and was encouraged to continue to do so. Recommended the use of compression stockings. We reviewed how to monitor for pitting edema and I encouraged her to make [place] aware if she notices worsening symptoms. Will not plan for a repeat echocardiogram at this time as imaging in 11/2019 was reassuring as outlined above.  2. Atypical Chest Pain - Her recent episodes of chest pain are overall atypical for a cardiac etiology as they last for seconds at a time and occur at rest. She has been going to the gym several days a week without any  exertional chest pain. Given that her recent Coronary CT in 03/2020 showed a coronary calcium score of 0 and due to her atypical symptoms, would not plan for further testing at this time. I encouraged her to reach out if symptoms progress in severity or frequency as we could arrange for a GXT.  3. HTN - BP is at well-controlled at 116/78 during today's visit. Continue Amlodipine '5mg'$  daily.   4. HLD - Followed by her PCP. FLP in 01/2021 showed total cholesterol of 136, triglycerides 31, HDL 55 and LDL 73. Continue Crestor '10mg'$  daily.     Medication Adjustments/Labs and Tests Ordered: Current medicines are reviewed at length with the patient today.  Concerns regarding medicines are outlined above.  Medication changes, Labs and Tests ordered today are listed in the Patient Instructions below. Patient Instructions  Medication Instructions:  Your physician recommends that you continue on your current medications as directed. Please refer to the Current Medication list given to you today.  *If you need a refill on your cardiac medications before your next appointment, please call your pharmacy*   Lab Work: NONE   If you have labs (blood work) drawn today and your tests are completely normal, you will receive your results only by: [place] (if you have MyChart) OR A paper copy in the mail If you have any lab test that is abnormal or we need to change your treatment, we will call you to review the results.   Testing/Procedures: NONE    Follow-Up: At [facility], you and your health needs are our priority.  As part of our continuing mission to provide you with exceptional heart care, we have created designated  Provider Care Teams.  These Care Teams include your primary Cardiologist (physician) and Advanced Practice Providers (APPs -  Physician Assistants and Nurse Practitioners) who all work together to provide you with the care you need, when you need it.  We recommend signing up  for the patient portal called "MyChart".  Sign up information is provided on this After Visit Summary.  MyChart is used to connect with patients for Virtual Visits (Telemedicine).  Patients are able to view lab/test results, encounter notes, upcoming appointments, etc.  Non-urgent messages can be sent to your provider as well.   To learn more about what you can do with MyChart, go to [email or web address].    Your next appointment:   1 year(s)  The format for your next appointment:   In Person  Provider:   You may see Dorris Carnes, MD or one of the following Advanced Practice Providers on your designated Care Team:   [place], PA-C  Ermalinda Barrios, [place] .    Other Instructions Thank you for choosing [facility]!    Important Information About Sugar         Signed, Erma Heritage, PA-C  08/13/2021 4:45 PM    [facility] [address] [place], [place] [address] Phone: [phone] Fax: [phone]

## 2021-08-13 ENCOUNTER — Encounter: Payer: Self-pay | Admitting: Student

## 2021-08-13 ENCOUNTER — Ambulatory Visit (INDEPENDENT_AMBULATORY_CARE_PROVIDER_SITE_OTHER): Payer: Medicare Other | Admitting: Student

## 2021-08-13 ENCOUNTER — Ambulatory Visit (INDEPENDENT_AMBULATORY_CARE_PROVIDER_SITE_OTHER): Payer: BC Managed Care – PPO

## 2021-08-13 VITALS — BP 116/78 | HR 76 | Ht 66.0 in | Wt 157.0 lb

## 2021-08-13 DIAGNOSIS — R600 Localized edema: Secondary | ICD-10-CM

## 2021-08-13 DIAGNOSIS — I10 Essential (primary) hypertension: Secondary | ICD-10-CM | POA: Diagnosis not present

## 2021-08-13 DIAGNOSIS — E785 Hyperlipidemia, unspecified: Secondary | ICD-10-CM | POA: Diagnosis not present

## 2021-08-13 DIAGNOSIS — J309 Allergic rhinitis, unspecified: Secondary | ICD-10-CM

## 2021-08-13 DIAGNOSIS — R0789 Other chest pain: Secondary | ICD-10-CM | POA: Diagnosis not present

## 2021-08-13 NOTE — Patient Instructions (Signed)
Medication Instructions:  Your physician recommends that you continue on your current medications as directed. Please refer to the Current Medication list given to you today.  *If you need a refill on your cardiac medications before your next appointment, please call your pharmacy*   Lab Work: NONE   If you have labs (blood work) drawn today and your tests are completely normal, you will receive your results only by: [place] (if you have MyChart) OR A paper copy in the mail If you have any lab test that is abnormal or we need to change your treatment, we will call you to review the results.   Testing/Procedures: NONE    Follow-Up: At [facility], you and your health needs are our priority.  As part of our continuing mission to provide you with exceptional heart care, we have created designated Provider Care Teams.  These Care Teams include your primary Cardiologist (physician) and Advanced Practice Providers (APPs -  Physician Assistants and Nurse Practitioners) who all work together to provide you with the care you need, when you need it.  We recommend signing up for the patient portal called "MyChart".  Sign up information is provided on this After Visit Summary.  MyChart is used to connect with patients for Virtual Visits (Telemedicine).  Patients are able to view lab/test results, encounter notes, upcoming appointments, etc.  Non-urgent messages can be sent to your provider as well.   To learn more about what you can do with MyChart, go to [email or web address].    Your next appointment:   1 year(s)  The format for your next appointment:   In Person  Provider:   You may see Dorris Carnes, MD or one of the following Advanced Practice Providers on your designated Care Team:   [place], PA-C  Ermalinda Barrios, [place] .    Other Instructions Thank you for choosing [facility]!    Important Information About Sugar

## 2021-08-20 ENCOUNTER — Ambulatory Visit (INDEPENDENT_AMBULATORY_CARE_PROVIDER_SITE_OTHER): Payer: Medicare Other

## 2021-08-20 DIAGNOSIS — J309 Allergic rhinitis, unspecified: Secondary | ICD-10-CM | POA: Diagnosis not present

## 2021-08-24 DIAGNOSIS — R7303 Prediabetes: Secondary | ICD-10-CM | POA: Diagnosis not present

## 2021-08-24 DIAGNOSIS — I10 Essential (primary) hypertension: Secondary | ICD-10-CM | POA: Diagnosis not present

## 2021-08-24 DIAGNOSIS — E785 Hyperlipidemia, unspecified: Secondary | ICD-10-CM | POA: Diagnosis not present

## 2021-08-25 ENCOUNTER — Ambulatory Visit (INDEPENDENT_AMBULATORY_CARE_PROVIDER_SITE_OTHER): Payer: Medicare Other | Admitting: Family Medicine

## 2021-08-25 ENCOUNTER — Encounter: Payer: Self-pay | Admitting: Family Medicine

## 2021-08-25 VITALS — BP 134/80 | HR 93 | Resp 16 | Ht 66.0 in | Wt 157.0 lb

## 2021-08-25 DIAGNOSIS — I10 Essential (primary) hypertension: Secondary | ICD-10-CM

## 2021-08-25 DIAGNOSIS — M5410 Radiculopathy, site unspecified: Secondary | ICD-10-CM

## 2021-08-25 DIAGNOSIS — Z9109 Other allergy status, other than to drugs and biological substances: Secondary | ICD-10-CM

## 2021-08-25 DIAGNOSIS — E785 Hyperlipidemia, unspecified: Secondary | ICD-10-CM

## 2021-08-25 DIAGNOSIS — R350 Frequency of micturition: Secondary | ICD-10-CM

## 2021-08-25 LAB — CMP14+EGFR
ALT: 27 IU/L (ref 0–32)
AST: 24 IU/L (ref 0–40)
Albumin/Globulin Ratio: 1.5 (ref 1.2–2.2)
Albumin: 4.3 g/dL (ref 3.9–4.9)
Alkaline Phosphatase: 120 IU/L (ref 44–121)
BUN/Creatinine Ratio: 16 (ref 12–28)
BUN: 13 mg/dL (ref 8–27)
Bilirubin Total: <0.2 mg/dL (ref 0.0–1.2)
CO2: 24 mmol/L (ref 20–29)
Calcium: 9.4 mg/dL (ref 8.7–10.3)
Chloride: 105 mmol/L (ref 96–106)
Creatinine, Ser: 0.83 mg/dL (ref 0.57–1.00)
Globulin, Total: 2.9 g/dL (ref 1.5–4.5)
Glucose: 96 mg/dL (ref 70–99)
Potassium: 4.5 mmol/L (ref 3.5–5.2)
Sodium: 143 mmol/L (ref 134–144)
Total Protein: 7.2 g/dL (ref 6.0–8.5)
eGFR: 78 mL/min/{1.73_m2} (ref >59)

## 2021-08-25 LAB — LIPID PANEL
Chol/HDL Ratio: 4.1 ratio (ref 0.0–4.4)
Cholesterol, Total: 223 mg/dL — ABNORMAL HIGH (ref 100–199)
HDL: 55 mg/dL (ref >39)
LDL Chol Calc (NIH): 156 mg/dL — ABNORMAL HIGH (ref 0–99)
Triglycerides: 67 mg/dL (ref 0–149)
VLDL Cholesterol Cal: 12 mg/dL (ref 5–40)

## 2021-08-25 LAB — HEMOGLOBIN A1C
Est. average glucose Bld gHb Est-mCnc: 126 mg/dL
Hgb A1c MFr Bld: 6 % — ABNORMAL HIGH (ref 4.8–5.6)

## 2021-08-25 MED ORDER — ROSUVASTATIN CALCIUM 10 MG PO TABS: ORAL_TABLET | ORAL | 3 refills | Status: DC

## 2021-08-25 MED ORDER — METHYLPREDNISOLONE ACETATE 80 MG/ML IJ SUSP
80.0000 mg | Freq: Once | INTRAMUSCULAR | Status: AC
  Administered 2021-08-25: 80 mg via INTRAMUSCULAR

## 2021-08-25 MED ORDER — PREDNISONE 10 MG PO TABS: 10.0000 mg | ORAL_TABLET | Freq: Two times a day (BID) | ORAL | 0 refills | Status: DC

## 2021-08-25 NOTE — Patient Instructions (Signed)
Welcome to [facility] in mid October, call if you need me sooner, Pneumonia 20 at visit  Depomedrol 80 mg IM in office followed by short course of prenisone to help with cough and allergies and asthma  You are referred to Dr Novella Rob for re evaluation   Please reduce fat in diet and sugar  It is important that you exercise regularly at least 30 minutes 5 times a week. If you develop chest pain, have severe difficulty breathing, or feel very tired, stop exercising immediately and seek medical attention   Resume crestor 1  tablet 3 times weekly  Blood pressure, kidney and liver function are GREAT!  Thanks for choosing [facility], we consider it a privelige to serve you.

## 2021-08-25 NOTE — Assessment & Plan Note (Signed)
Uncontrolled , Depomedrol '80mg'$  IM in office  and 5 day course of prednisone, needs appt with allergist

## 2021-08-25 NOTE — Assessment & Plan Note (Signed)
Hyperlipidemia:Low fat diet discussed and encouraged.   Lipid Panel  Lab Results  Component Value Date   CHOL 223 (H) 08/24/2021   HDL 55 08/24/2021   LDLCALC 156 (H) 08/24/2021   TRIG 67 08/24/2021   CHOLHDL 4.1 08/24/2021     Resume 3 times weekly crestor

## 2021-08-25 NOTE — Assessment & Plan Note (Signed)
Uncontrolled refer Urology, reports good stream, denies dysuria

## 2021-08-26 ENCOUNTER — Encounter: Payer: Self-pay | Admitting: Family Medicine

## 2021-08-26 NOTE — Assessment & Plan Note (Signed)
Controlled, no change in medication DASH diet and commitment to daily physical activity for a minimum of 30 minutes discussed and encouraged, as a part of hypertension management. The importance of attaining a healthy weight is also discussed.     08/25/2021   11:20 AM 08/13/2021    1:03 PM 05/21/2021   10:28 AM 02/16/2021    9:07 AM 09/01/2020   11:22 AM 08/04/2020    2:47 PM 05/22/2020    2:47 PM  BP/Weight  Systolic BP 716 967 893 810 175 102 585  Diastolic BP 80 78 68 76 72 80 88  Wt. (Lbs) 157 157 152.12 156  150   BMI 25.34 kg/m2 25.34 kg/m2 24.55 kg/m2 25.18 kg/m2  24.21 kg/m2

## 2021-08-26 NOTE — Progress Notes (Signed)
Denise Werner     MRN: [id number]      DOB: 06-04-1955   HPI Denise Werner is here for follow up and re-evaluation of chronic medical conditions, medication management and review of any available recent lab and radiology data.  Preventive health is updated, specifically  Cancer screening and Immunization.   Questions or concerns regarding consultations or procedures which the PT has had in the interim are  addressed. The PT denies any adverse reactions to current medications since the last visit.  C/o uncontrolled allergy symptoms, chronic cough and post nasal drainage, uses 1 inhaler sometimes, has not seen allergist in approx 1 year, symptoms uncontrolled Chronic back pain. Uncontrolled and excessive urination, does not have incontinence but frequency and polyuria, denies dysuria, blood in urine , or flank pain Will work on improving food choice and more regular exercise has been under a lot of stress in past 9 months as spouse dx and is being treated for bladder cancer, reportedly doing well and near the end of treatment ROS Denies recent fever or chills. Denies sinus pressure, nasal congestion, ear pain or sore throat. Denies chest congestion, productive cough or wheezing. Denies chest pains, palpitations and leg swelling Denies abdominal pain, nausea, vomiting,diarrhea or constipation.    Denies headaches, seizures, numbness, or tingling. Denies depression, anxiety or insomnia. Denies skin break down or rash.   PE  BP 134/80   Pulse 93   Resp 16   Ht '5\' 6"'$  (1.676 m)   Wt 157 lb (71.2 kg)   SpO2 96%   BMI 25.34 kg/m   Patient alert and oriented and in no cardiopulmonary distress.  HEENT: No facial asymmetry, EOMI,     Neck supple .  Chest: Clear to auscultation bilaterally.  CVS: S1, S2 no murmurs, no S3.Regular rate.  ABD: Soft non tender.   Ext: No edema  MS: Adequate ROM spine, shoulders, hips and knees.  Skin: Intact, no ulcerations or rash  noted.  Psych: Good eye contact, normal affect. Memory intact not anxious or depressed appearing.  CNS: CN 2-12 intact, power,  normal throughout.no focal deficits noted.   Assessment & Plan  Environmental allergies Uncontrolled , Depomedrol '80mg'$  IM in office  and 5 day course of prednisone, needs appt with allergist  Urinary frequency Uncontrolled refer Urology, reports good stream, denies dysuria  Hyperlipidemia LDL goal <100 Hyperlipidemia:Low fat diet discussed and encouraged.   Lipid Panel  Lab Results  Component Value Date   CHOL 223 (H) 08/24/2021   HDL 55 08/24/2021   LDLCALC 156 (H) 08/24/2021   TRIG 67 08/24/2021   CHOLHDL 4.1 08/24/2021     Resume 3 times weekly crestor  HTN (hypertension) Controlled, no change in medication DASH diet and commitment to daily physical activity for a minimum of 30 minutes discussed and encouraged, as a part of hypertension management. The importance of attaining a healthy weight is also discussed.     08/25/2021   11:20 AM 08/13/2021    1:03 PM 05/21/2021   10:28 AM 02/16/2021    9:07 AM 09/01/2020   11:22 AM 08/04/2020    2:47 PM 05/22/2020    2:47 PM  BP/Weight  Systolic BP 950 932 671 245 809 983 382  Diastolic BP 80 78 68 76 72 80 88  Wt. (Lbs) 157 157 152.12 156  150   BMI 25.34 kg/m2 25.34 kg/m2 24.55 kg/m2 25.18 kg/m2  24.21 kg/m2        Back pain with right-sided  radiculopathy Chronic, uses NSAD  Or gabapentin for flare of pain

## 2021-08-26 NOTE — Assessment & Plan Note (Signed)
Chronic, uses NSAD  Or gabapentin for flare of pain

## 2021-09-01 ENCOUNTER — Encounter (HOSPITAL_COMMUNITY): Payer: BC Managed Care – PPO

## 2021-09-17 ENCOUNTER — Ambulatory Visit (INDEPENDENT_AMBULATORY_CARE_PROVIDER_SITE_OTHER): Payer: Medicare Other | Admitting: *Deleted

## 2021-09-17 DIAGNOSIS — J309 Allergic rhinitis, unspecified: Secondary | ICD-10-CM | POA: Diagnosis not present

## 2021-09-20 NOTE — Progress Notes (Signed)
H&P  Chief Complaint: Significant lower urinary tract symptoms  History of Present Illness: Denise Werner is a 66 y.o. year old female with significant previous urologic history.  Treated for interstitial cystitis for quite some time by Dr. Gaynelle Arabian.  The patient is on Elmiron both orally and intravesically.  Unfortunately, she states that despite these treatments she did not have significant improvement.  Symptoms have been going on for 15 to 20 years.  She states that when she saw Dr. Era Bumpers the most successful treatment was hydrodistention.  She saw me [place] office within the past year or 2.  I gave her samples of Myrbetriq that did not help her lower urinary tract symptoms.  She states that she would rather not take medical therapy for her symptoms.  Symptoms include frequency which is her biggest complaint, urgency and nocturia x3-4.  She does not have pelvic pain.  She does not have GI symptomatology.  Symptoms are not made worse by intercourse.  She does not have frequent urinary tract infections.  She does not drink significant amounts of caffeine and does limit her diet.  Past Medical History:  Diagnosis Date   Asthma    Bladder pain    Chronic neck pain    Dyslipidemia    Frequency of urination    GERD (gastroesophageal reflux disease) OCCASIONAL   H/O hiatal hernia    Hyperlipidemia    Phreesia 01/04/2020   Hypertension    Phreesia 01/04/2020   Interstitial cystitis    Nocturia    Short of breath on exertion    Urgency of urination     Past Surgical History:  Procedure Laterality Date   ABDOMINAL HYSTERECTOMY     W/ BILATERAL SALPINGOOPHECTOMY   APPENDECTOMY     BIOPSY  04/11/2018   Procedure: BIOPSY;  Surgeon: Rogene Houston, MD;  [facility]: [facility];  Service: Endoscopy;;  colon polyp with biopsy forceps   BUNIONECTOMY  04-14-2003   LEFT FOOT  W/ POST REMOVAL INTERNAL FIXATION DEVICE  VIA SILVER BUNIONECTOMY   CHOLECYSTECTOMY  1998   COLONOSCOPY  N/A 04/11/2018   Procedure: COLONOSCOPY;  Surgeon: Rogene Houston, MD;  Location: [facility];  Service: Endoscopy;  Laterality: N/A;  730   CYSTO WITH HYDRODISTENSION  09/12/2011   Procedure: CYSTOSCOPY/HYDRODISTENSION;  Surgeon: Ailene Rud, MD;  Location: [facility];  Service: Urology;  Laterality: N/A;  [facility] M & P AND M & P   CYSTO/ HOD/ INSTILLATION THERAPY  11-17-2008;   12-02-1999   INTERSTITIAL CYSTITIS   ESOPHAGOGASTRODUODENOSCOPY N/A 05/01/2013   Procedure: ESOPHAGOGASTRODUODENOSCOPY (EGD);  Surgeon: Rogene Houston, MD;  Location: [facility];  Service: Endoscopy;  Laterality: N/A;  100   FINGER SURGERY  10-13-2009   left 5th finger ARTHRODESIS   FOOT ARTHROPLASTY  05-03-2004   FIFTHE TOE RIGHT FOOT   KNEE ARTHROSCOPY  JAN 2013   LEFT KNEE    Home Medications:  (Not in a hospital admission)   Allergies:  Allergies  Allergen Reactions   Hydrocodone Nausea And Vomiting and Nausea Only    Also causes dizziness and states feels like she has an outer body experience. Also causes dizziness and states feels like she has an outer body experience.   Other Rash   Sulfonamide Derivatives Rash    Family History  Problem Relation Age of Onset   Cancer Brother    Lupus Sister    Hypertension Sister    Aneurysm Sister  x4    Social History:  reports that she has never smoked. She has never used smokeless tobacco. She reports that she does not drink alcohol and does not use drugs.  ROS: A complete review of systems was performed.  All systems are negative except for pertinent findings as noted.  Physical Exam:  Vital signs in last 24 hours: '@VSRANGES'$ @ General:  Alert and oriented, No acute distress HEENT: Normocephalic, atraumatic Neck: No JVD or lymphadenopathy Cardiovascular: Regular rate  Lungs: Normal inspiratory/expiratory excursion Neurologic: Grossly intact  I have reviewed prior pt notes  I have reviewed notes from  previous physicians  I have reviewed urinalysis results  I have independently reviewed prior imaging  Impression/Assessment:  Overactive bladder symptoms.  I do not necessarily think she has interstitial cystitis but she did improvement of her symptoms with HOD.  She would rather not take medications at this point.  Plan:  The patient would like to proceed with cystoscopy and HOD.  I explained the procedure to her.  We will do this on an patient basis.  If that fails, consider neuromodulation. Lillette Boxer Daric Koren 09/20/2021, 8:02 PM  Lillette Boxer. Triva Hueber MD

## 2021-09-21 ENCOUNTER — Encounter: Payer: Self-pay | Admitting: Urology

## 2021-09-21 ENCOUNTER — Ambulatory Visit (INDEPENDENT_AMBULATORY_CARE_PROVIDER_SITE_OTHER): Payer: Medicare Other | Admitting: Urology

## 2021-09-21 VITALS — BP 148/89 | HR 89

## 2021-09-21 DIAGNOSIS — R350 Frequency of micturition: Secondary | ICD-10-CM

## 2021-09-21 DIAGNOSIS — R3915 Urgency of urination: Secondary | ICD-10-CM

## 2021-09-21 DIAGNOSIS — R351 Nocturia: Secondary | ICD-10-CM

## 2021-09-21 LAB — URINALYSIS, ROUTINE W REFLEX MICROSCOPIC
Bilirubin, UA: NEGATIVE
Glucose, UA: NEGATIVE
Ketones, UA: NEGATIVE
Leukocytes,UA: NEGATIVE
Nitrite, UA: NEGATIVE
Protein,UA: NEGATIVE
RBC, UA: NEGATIVE
Specific Gravity, UA: 1.01 (ref 1.005–1.030)
Urobilinogen, Ur: 0.2 mg/dL (ref 0.2–1.0)
pH, UA: 7 (ref 5.0–7.5)

## 2021-09-21 NOTE — Progress Notes (Signed)
post void residual=33

## 2021-10-05 ENCOUNTER — Encounter (HOSPITAL_COMMUNITY)
Admission: RE | Admit: 2021-10-05 | Discharge: 2021-10-05 | Disposition: A | Discharge status: Home / Self Care | Payer: Medicare Other | Source: Ambulatory Visit | Attending: Family Medicine | Admitting: Family Medicine

## 2021-10-05 ENCOUNTER — Other Ambulatory Visit: Payer: Self-pay

## 2021-10-05 VITALS — BP 121/66 | HR 75 | Temp 97.6°F | Resp 18

## 2021-10-05 DIAGNOSIS — M810 Age-related osteoporosis without current pathological fracture: Secondary | ICD-10-CM

## 2021-10-05 MED ORDER — ZOLEDRONIC ACID 5 MG/100ML IV SOLN
5.0000 mg | Freq: Once | INTRAVENOUS | Status: AC
  Administered 2021-10-05: 5 mg via INTRAVENOUS

## 2021-10-05 NOTE — Progress Notes (Signed)
Diagnosis: Osteoporosis  Provider:  Tula Nakayama MD  Procedure: Infusion  IV Type: Peripheral, IV Location: R Forearm  Reclast (Zolendronic Acid), Dose: 5 mg  Infusion Start Time: 0698  Infusion Stop Time: 6148  Post Infusion IV Care: Patient declined observation and Peripheral IV Discontinued  Discharge: Condition: Good, Destination: Home . AVS provided to patient.   Performed by:  Jonelle Sidle, RN

## 2021-10-15 ENCOUNTER — Ambulatory Visit (INDEPENDENT_AMBULATORY_CARE_PROVIDER_SITE_OTHER): Payer: Medicare Other

## 2021-10-15 ENCOUNTER — Telehealth: Payer: Self-pay

## 2021-10-15 DIAGNOSIS — J309 Allergic rhinitis, unspecified: Secondary | ICD-10-CM | POA: Diagnosis not present

## 2021-10-15 NOTE — Telephone Encounter (Signed)
Patient stopped by for a shot today and she states she received a bill. She has [facility] MCR now, so I added that in. She is wondering if we can re file to her insurance plans?  Thanks

## 2021-10-17 ENCOUNTER — Other Ambulatory Visit: Payer: Self-pay | Admitting: Family Medicine

## 2021-10-22 ENCOUNTER — Ambulatory Visit (INDEPENDENT_AMBULATORY_CARE_PROVIDER_SITE_OTHER): Payer: Medicare Other

## 2021-10-22 DIAGNOSIS — J309 Allergic rhinitis, unspecified: Secondary | ICD-10-CM

## 2021-11-05 ENCOUNTER — Ambulatory Visit (INDEPENDENT_AMBULATORY_CARE_PROVIDER_SITE_OTHER): Payer: Medicare Other

## 2021-11-05 DIAGNOSIS — J309 Allergic rhinitis, unspecified: Secondary | ICD-10-CM

## 2021-11-12 ENCOUNTER — Ambulatory Visit (INDEPENDENT_AMBULATORY_CARE_PROVIDER_SITE_OTHER): Payer: Medicare Other

## 2021-11-12 DIAGNOSIS — J309 Allergic rhinitis, unspecified: Secondary | ICD-10-CM

## 2021-11-17 ENCOUNTER — Encounter: Payer: Self-pay | Admitting: Family Medicine

## 2021-11-17 ENCOUNTER — Ambulatory Visit (INDEPENDENT_AMBULATORY_CARE_PROVIDER_SITE_OTHER): Payer: Medicare Other | Admitting: Family Medicine

## 2021-11-17 ENCOUNTER — Other Ambulatory Visit: Payer: Self-pay

## 2021-11-17 VITALS — BP 123/76 | HR 87 | Ht 66.0 in | Wt 159.0 lb

## 2021-11-17 DIAGNOSIS — H547 Unspecified visual loss: Secondary | ICD-10-CM

## 2021-11-17 DIAGNOSIS — Z0000 Encounter for general adult medical examination without abnormal findings: Secondary | ICD-10-CM

## 2021-11-17 DIAGNOSIS — R7303 Prediabetes: Secondary | ICD-10-CM

## 2021-11-17 DIAGNOSIS — Z1231 Encounter for screening mammogram for malignant neoplasm of breast: Secondary | ICD-10-CM

## 2021-11-17 DIAGNOSIS — Z1211 Encounter for screening for malignant neoplasm of colon: Secondary | ICD-10-CM

## 2021-11-17 DIAGNOSIS — E785 Hyperlipidemia, unspecified: Secondary | ICD-10-CM

## 2021-11-17 DIAGNOSIS — E559 Vitamin D deficiency, unspecified: Secondary | ICD-10-CM

## 2021-11-17 DIAGNOSIS — I10 Essential (primary) hypertension: Secondary | ICD-10-CM

## 2021-11-17 DIAGNOSIS — Z23 Encounter for immunization: Secondary | ICD-10-CM

## 2021-11-17 DIAGNOSIS — Z13820 Encounter for screening for osteoporosis: Secondary | ICD-10-CM

## 2021-11-17 NOTE — Progress Notes (Signed)
Preventive Screening-Counseling & Management   Patient present here today for a welcome to medicare visit  Current Problems (verified)   Medications Prior to Visit Allergies (verified)   PAST HISTORY  Family History Mom died at 84 CHF, pneumonia,; Dad died in his 12's of brain aneurysm Seven siblings, youngest died at age 65 of sinus cancer;brother died at age 25 of prostate  cancer , 4 siblings living, brain aneurysm x 16, one sister has lupus  Social History  Married mother of 2 adult daughters No current or past nicpotine or alcohol use   Risk Factors  Current exercise habits:  1 day/ week to 2 for 45 mins to 1 hr  Dietary issues discussed:low fat lo sugar , plant based   Cardiac risk factors:  Hypertension, hyperlipidemia, needs to increase exercise commitment Depression Screen  (Note: if answer to either of the following is "Yes", a more complete depression screening is indicated)   Over the past two weeks, have you felt down, depressed or hopeless? No  Over the past two weeks, have you felt little interest or pleasure in doing things? No  Have you lost interest or pleasure in daily life? No  Do you often feel hopeless? No  Do you cry easily over simple problems? No   Activities of Daily Living  In your present state of health, do you have any difficulty performing the following activities?  Driving?: No Managing money?: No Feeding yourself?:No Getting from bed to chair?:No Climbing a flight of stairs?:No Preparing food and eating?:No Bathing or showering?:No Getting dressed?:No Getting to the toilet?:No Using the toilet?:No Moving around from place to place?: No  Fall Risk Assessment In the past year have you fallen or had a near fall?:No Are you currently taking any medications that make you dizziy?:No   Hearing Difficulties: No Do you often ask people to speak up or repeat themselves?:No Do you experience ringing or noises in your ears?:No Do you have  difficulty understanding soft or whispered voices?:No  Cognitive Testing  Alert? Yes Normal Appearance?Yes  Oriented to person? Yes Place? Yes  Time? Yes  Displays appropriate judgment?Yes  Can read the correct time from a watch face? yes Are you having problems remembering things?No  Advanced Directives have been discussed with the patient?Yes . Information given for her to  review with her spouse   List the Names of Other Physician/Practitioners you currently use: dr Vernie Shanks, Dr Novella Rob, dr Dorma Russell any recent Medical Services you may have received from other than [facility] providers in the past year (date may be approximate).     Medicare Attestation  I have personally reviewed:  The patient's medical and social history  Their use of alcohol, tobacco or illicit drugs  Their current medications and supplements  The patient's functional ability including ADLs,fall risks, home safety risks, cognitive, and hearing and visual impairment  Diet and physical activities  Evidence for depression or mood disorders  The patient's weight, height, BMI, and visual acuity have been recorded in the chart. I have made referrals, counseling, and provided education to the patient based on review of the above and I have provided the patient with a written personalized care plan for preventive services.    Physical Exam BP 123/76 (BP Location: Right Arm, Patient Position: Sitting, Cuff Size: Large)   Pulse 87   Ht '5\' 6"'$  (1.676 m)   Wt 159 lb (72.1 kg)   SpO2 94%   BMI 25.66 kg/m    Assessment &  Plan:  Welcome to Medicare preventive visit Welcome to medicare  exam as documented. Counseling done  re healthy lifestyle involving commitment to 150 minutes exercise per week, heart healthy diet, and attaining healthy weight.The importance of adequate sleep also discussed.  Immunization and cancer screening needs are specifically addressed at this visit.

## 2021-11-17 NOTE — Patient Instructions (Addendum)
Annual exam in office end  February, call if you need me sooner  Pneumonia 20 today  You will be referred to ophthalmology for eye exam due to poor vision reported  Please  schedule mammogram at checkout, feb 8 or after  Please sched dexa at checkout  Fasting lipid, cmp and EGFr, hBA1c, TSH, cBC and vit D 1 week before Feb appointment  Nurse Please order FIT test, and explain  Please work on living will  It is important that you exercise regularly at least 30 minutes 5 times a week. If you develop chest pain, have severe difficulty breathing, or feel very tired, stop exercising immediately and seek medical attention     We will assist with urology f/u appt and get back to you  Thanks for choosing [facility], we consider it a privelige to serve you.

## 2021-11-19 ENCOUNTER — Other Ambulatory Visit: Payer: Self-pay | Admitting: Family Medicine

## 2021-11-19 ENCOUNTER — Ambulatory Visit (INDEPENDENT_AMBULATORY_CARE_PROVIDER_SITE_OTHER): Payer: Medicare Other

## 2021-11-19 DIAGNOSIS — J309 Allergic rhinitis, unspecified: Secondary | ICD-10-CM | POA: Diagnosis not present

## 2021-11-21 ENCOUNTER — Encounter: Payer: Self-pay | Admitting: Family Medicine

## 2021-11-21 DIAGNOSIS — Z0000 Encounter for general adult medical examination without abnormal findings: Secondary | ICD-10-CM | POA: Insufficient documentation

## 2021-11-21 NOTE — Assessment & Plan Note (Signed)
Welcome to medicare  exam as documented. Counseling done  re healthy lifestyle involving commitment to 150 minutes exercise per week, heart healthy diet, and attaining healthy weight.The importance of adequate sleep also discussed.  Immunization and cancer screening needs are specifically addressed at this visit.

## 2021-11-22 ENCOUNTER — Telehealth: Payer: Self-pay

## 2021-11-22 NOTE — Telephone Encounter (Signed)
Patient needing to know if she will need a cysto done with Dr. Diona Fanti.  Please call pt to let her know.   Call back:  Phone:  [phone]  Thanks, Helene Kelp

## 2021-11-23 ENCOUNTER — Telehealth: Payer: Self-pay

## 2021-11-23 NOTE — Telephone Encounter (Signed)
Called patient to confirm that she wants to move forward with the Cysto and HOD.  Patient states she will think about it more and call back once she has made a decision.  I did offer patient an apt to discuss this with Dr. Diona Fanti and she denied apt at this time.

## 2021-11-23 NOTE — Telephone Encounter (Signed)
Patient called yes she did receive her pneumonia  shot last visit

## 2021-11-23 NOTE — Telephone Encounter (Signed)
Documented

## 2021-11-24 DIAGNOSIS — J3089 Other allergic rhinitis: Secondary | ICD-10-CM | POA: Diagnosis not present

## 2021-11-24 NOTE — Progress Notes (Signed)
VIALS EXP 11-25-22

## 2021-11-25 DIAGNOSIS — J301 Allergic rhinitis due to pollen: Secondary | ICD-10-CM | POA: Diagnosis not present

## 2021-12-31 ENCOUNTER — Ambulatory Visit (INDEPENDENT_AMBULATORY_CARE_PROVIDER_SITE_OTHER): Payer: Medicare Other | Admitting: Allergy & Immunology

## 2021-12-31 ENCOUNTER — Encounter: Payer: Self-pay | Admitting: Allergy & Immunology

## 2021-12-31 DIAGNOSIS — R0602 Shortness of breath: Secondary | ICD-10-CM

## 2021-12-31 DIAGNOSIS — J0100 Acute maxillary sinusitis, unspecified: Secondary | ICD-10-CM

## 2021-12-31 DIAGNOSIS — J4540 Moderate persistent asthma, uncomplicated: Secondary | ICD-10-CM

## 2021-12-31 DIAGNOSIS — K219 Gastro-esophageal reflux disease without esophagitis: Secondary | ICD-10-CM

## 2021-12-31 MED ORDER — TRELEGY ELLIPTA 100-62.5-25 MCG/ACT IN AEPB: 1.0000 | INHALATION_SPRAY | Freq: Every day | RESPIRATORY_TRACT | 5 refills | Status: DC

## 2021-12-31 MED ORDER — PANTOPRAZOLE SODIUM 20 MG PO TBEC: 20.0000 mg | DELAYED_RELEASE_TABLET | Freq: Every day | ORAL | 3 refills | Status: DC

## 2021-12-31 MED ORDER — LEVALBUTEROL HCL 1.25 MG/3ML IN NEBU: 1.2500 mg | INHALATION_SOLUTION | RESPIRATORY_TRACT | 3 refills | Status: DC | PRN

## 2021-12-31 MED ORDER — XHANCE 93 MCG/ACT NA EXHU: 2.0000 | INHALANT_SUSPENSION | Freq: Two times a day (BID) | NASAL | 5 refills | Status: DC

## 2021-12-31 MED ORDER — AMOXICILLIN-POT CLAVULANATE 875-125 MG PO TABS: 1.0000 | ORAL_TABLET | Freq: Two times a day (BID) | ORAL | 0 refills | Status: AC

## 2021-12-31 NOTE — Progress Notes (Signed)
RE: Denise Werner MRN: [id number] DOB: Dec 31, 1955 Date of Telemedicine Visit: 12/31/2021  Referring provider: Fayrene Helper, MD Primary care provider: Fayrene Helper, MD  Chief Complaint: Sinusitis and Wheezing   Telemedicine Follow Up Visit via Telephone: I connected with Denise Werner for a follow up on 12/31/21 by telephone and verified that [name] using two identifiers.   I discussed the limitations, risks, security and privacy concerns of performing an evaluation and management service by telephone and the availability of in person appointments. I also discussed with the patient that there may be a patient responsible charge related to this service. The patient expressed understanding and agreed to proceed.  Patient is at home.  Provider is at the office.  Visit start time: 3:20 PM Visit end time: 3:40 PM Insurance consent/check in by: Denise Werner consent and medical assistant/nurse: Denise Werner  History of Present Illness:  She is a 66 y.o. female, who is being followed for perennial and seasonal allergic rhinitis as well as moderate persistent asthma and reflux. Her previous allergy office visit was in May 2022 with Denise Werner. At that time, her Truett Perna was increased to two sprays per nostril twice daily. Her Claritin was stopped and she was started on levocetirizine 5 mg daily. Her Singulair was stopped. For her SOB, she was continued on Trelegy one puff once daily as well as albuterol as needed. Reflux was controlled with pantoprazole daily as well as dietary changes.   She has not been seen in a regular office visit since that time, although she has continued to get allergy shots.   Since the last visit, she has largely done well. She is coughing up a lot of mucous. It was green and now transitioned to yellow. She went to Urgent Care and received what sounds like azithromycin and an antibiotic (she kept saying erythromycin, but I  presumed azithromycin or doxycycline). She started the antibiotics around two weeks ago. She denies fevers or chills. She did have COVID testing which was negative. There are no other sick contacts.   She has been using the albuterol nebulizer which is helping a bit. She was able to sleep a couple of nights in a row.   Allergy shots are working very well. She has not been able to come in a few weeks because of the illness. Before she was sick, they were working ok. She is on on [place]. She was on Singulair but she stopped it. She did not feel that it worked for her.   She is not using Trelegy. She ran out. She tells me that she just does not like to take medications at all, although she does note that without the Trelegy, she is using the albuterol more often.   GERD IS controlled. She takes Protonix daily.   Otherwise, there have been no changes to her past medical history, surgical history, family history, or social history.  Assessment and Plan:  Bricelyn is a 66 y.o. 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 We are also going to see her in two weeks to get an updated spirometry and discuss her asthma symptoms. We could looking to making her controlled medication as needed, but at this point I recommended that she continue with the Trelegy as prescribed. She is in agreement with the plan.   Diagnostics: None.  Medication List:  Current Outpatient Medications  Medication Sig Dispense Refill    amoxicillin-clavulanate (AUGMENTIN) 875-125 MG tablet Take 1 tablet by mouth 2 (two) times daily for 14 days. 28 tablet 0   Fluticasone-Umeclidin-Vilant (TRELEGY ELLIPTA) 100-62.5-25 MCG/ACT AEPB Inhale 1 puff into the lungs daily. 28 each 5   albuterol (VENTOLIN HFA) 108 (90 Base) MCG/ACT inhaler Inhale 2 puffs into the lungs every 6 (six) hours as needed for wheezing or shortness of breath. 18 g 1   amitriptyline (ELAVIL) 25 MG tablet Take 25 mg by mouth at bedtime.     amLODipine (NORVASC) 5 MG tablet TAKE 1 TABLET BY MOUTH EVERY DAY 90 tablet 1   Calcium Carbonate-Vit D-Min (CALCIUM 1200) 1200-1000 MG-UNIT CHEW Chew 2 tablets by mouth daily.     Fluticasone Propionate (XHANCE) 93 MCG/ACT EXHU Place 2 puffs into the nose in the morning and at bedtime. 16 mL 5   ibuprofen (ADVIL) 600 MG tablet TAKE ONE TABLET BY MOUTH EVERY 8 HOURS AS NEEDED FOR BACK AND LEFT KNEE PAIN 30 tablet 0   levalbuterol (XOPENEX) 1.25 MG/3ML nebulizer solution Take 1.25 mg by nebulization every 4 (four) hours as needed for wheezing. 72 mL 3   pantoprazole (PROTONIX) 20 MG tablet Take 1 tablet (20 mg total) by mouth daily. 90 tablet 3   rosuvastatin (CRESTOR) 10 MG tablet TAKE 1 TABLET BY MOUTH EVERY DAY 90 tablet 1   zoledronic acid (RECLAST) 5 MG/100ML SOLN injection Inject 5 mg into the vein.     No current facility-administered medications for this visit.   Allergies: Allergies  Allergen Reactions   Hydrocodone Nausea And Vomiting and Nausea Only    Also causes dizziness and states feels like she has an outer body experience. Also causes dizziness and states feels like she has an outer body experience.   Other Rash   Sulfonamide Derivatives Rash   I reviewed her past medical history, social history, family history, and environmental history and no significant changes have been reported from previous visits.  Review of Systems  Constitutional:  Negative for activity change and appetite change.  HENT:   Positive for sinus pressure, sinus pain, sneezing and sore throat. Negative for congestion, postnasal drip and rhinorrhea.   Eyes:  Negative for pain, discharge, redness and itching.  Respiratory:  Negative for shortness of breath, wheezing and stridor.   Gastrointestinal:  Negative for diarrhea, nausea and vomiting.  Endocrine: Negative for cold intolerance and heat intolerance.  Musculoskeletal:  Negative for arthralgias, joint swelling and myalgias.  Skin:  Negative for rash.  Allergic/Immunologic: Positive for environmental allergies. Negative for food allergies.    Objective:  Physical exam not obtained as encounter was done via telephone.   Previous notes and tests were reviewed.  I discussed the assessment and treatment plan with the patient. The patient was provided an opportunity to ask questions and all were answered. The patient agreed with the plan and demonstrated an understanding of the instructions.   The patient was advised to call back or seek an in-person evaluation if the symptoms worsen or if the condition fails to improve as anticipated.  I provided 20  minutes of non-face-to-face time during this encounter.  It was my pleasure to participate in [facility] care today. Please feel free to contact me with any questions or concerns.   Sincerely,  Valentina Shaggy, MD

## 2022-01-04 ENCOUNTER — Encounter: Payer: Self-pay | Admitting: Allergy & Immunology

## 2022-01-04 DIAGNOSIS — R079 Chest pain, unspecified: Secondary | ICD-10-CM

## 2022-01-04 DIAGNOSIS — J4540 Moderate persistent asthma, uncomplicated: Secondary | ICD-10-CM

## 2022-01-06 ENCOUNTER — Ambulatory Visit (HOSPITAL_COMMUNITY)
Admission: RE | Admit: 2022-01-06 | Discharge: 2022-01-06 | Disposition: A | Discharge status: Home / Self Care | Payer: Medicare Other | Source: Ambulatory Visit | Attending: Allergy & Immunology | Admitting: Allergy & Immunology

## 2022-01-06 DIAGNOSIS — R079 Chest pain, unspecified: Secondary | ICD-10-CM | POA: Diagnosis not present

## 2022-01-10 DIAGNOSIS — M1712 Unilateral primary osteoarthritis, left knee: Secondary | ICD-10-CM | POA: Diagnosis not present

## 2022-01-19 ENCOUNTER — Ambulatory Visit (INDEPENDENT_AMBULATORY_CARE_PROVIDER_SITE_OTHER): Payer: Medicare Other | Admitting: Allergy & Immunology

## 2022-01-19 ENCOUNTER — Encounter: Payer: Self-pay | Admitting: Allergy & Immunology

## 2022-01-19 ENCOUNTER — Other Ambulatory Visit: Payer: Self-pay

## 2022-01-19 VITALS — BP 122/70 | HR 84 | Temp 98.3°F | Resp 16 | Ht 66.0 in | Wt 161.8 lb

## 2022-01-19 DIAGNOSIS — J4540 Moderate persistent asthma, uncomplicated: Secondary | ICD-10-CM | POA: Diagnosis not present

## 2022-01-19 DIAGNOSIS — J309 Allergic rhinitis, unspecified: Secondary | ICD-10-CM | POA: Diagnosis not present

## 2022-01-19 DIAGNOSIS — R0602 Shortness of breath: Secondary | ICD-10-CM

## 2022-01-19 DIAGNOSIS — K219 Gastro-esophageal reflux disease without esophagitis: Secondary | ICD-10-CM | POA: Diagnosis not present

## 2022-01-19 DIAGNOSIS — J302 Other seasonal allergic rhinitis: Secondary | ICD-10-CM

## 2022-01-19 MED ORDER — TRELEGY ELLIPTA 100-62.5-25 MCG/ACT IN AEPB: 1.0000 | INHALATION_SPRAY | Freq: Every day | RESPIRATORY_TRACT | 5 refills | Status: DC

## 2022-01-19 MED ORDER — LEVALBUTEROL TARTRATE 45 MCG/ACT IN AERO: 2.0000 | INHALATION_SPRAY | Freq: Four times a day (QID) | RESPIRATORY_TRACT | 1 refills | Status: DC | PRN

## 2022-01-19 MED ORDER — LEVOCETIRIZINE DIHYDROCHLORIDE 5 MG PO TABS: 5.0000 mg | ORAL_TABLET | Freq: Two times a day (BID) | ORAL | 5 refills | Status: DC

## 2022-01-19 MED ORDER — EPINEPHRINE 0.3 MG/0.3ML IJ SOAJ: 0.3000 mg | INTRAMUSCULAR | 1 refills | Status: DC | PRN

## 2022-01-19 MED ORDER — XHANCE 93 MCG/ACT NA EXHU: 2.0000 | INHALANT_SUSPENSION | Freq: Two times a day (BID) | NASAL | 5 refills | Status: DC

## 2022-01-19 NOTE — Progress Notes (Signed)
FOLLOW UP  Date of Service/Encounter:  01/19/22   Assessment:   Moderate persistent asthma, uncomplicated    Seasonal and perennial allergic rhinitis (trees, weeds, grasses, indoor molds, outdoor molds, dust mites, cat and cockroach) - on allergen immunotherapy   Bilateral conjunctivitis with xerophthalmia - diagnosed recently with clogged tear ducts bilaterally   Shortness of breath - full cardiac work-up done   Acute sinusitis - partially treated (adding on Augmentin BID for two weeks)  Plan/Recommendations:    Allergic rhintis - Continue with Xhance nasal spray to 2 sprays in each nostril twice a day to help control nasal congestion AS NEEDED (when Flonase  - Continue with Xyzal 5 mg 1-2 a day as needed for a runny nose. - Consider saline nasal rinses as needed for nasal symptoms. Use this before any medicated nasal sprays for best result - Continue allergen immunotherapy and have access to an epinephrine auto-injector set  2. Moderate persistent asthma, uncomplicated  - Continue Trelegy 100-1 puff once a day to prevent cough or wheeze - Continue albuterol 2 puffs every 4 hours as needed for cough or wheeze OR Instead use albuterol 0.083% solution via nebulizer one unit vial every 4 hours as needed for cough or wheeze  3. Reflux - Continue dietary and lifestyle modifications as listed below - Continue with pantoprazole 20 mg once a day to control reflux  4. Return in about 6 months (around 07/21/2022).    Subjective:   Denise Werner is a 66 y.o. female presenting today for follow up of  Chief Complaint  Patient presents with   Asthma    Says she is well. Was sick for a while.     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  Denise Werner is a 66 y.o. female presenting for a follow up visit. We last saw her as a televisit in December 2023.  At that time, she had already been on azithromycin for bronchitis.  She was having continued coughing and sputum production, so we added on Augmentin for increased coverage.  Since the last visit, she has done well. She did finish the antibiotics. She got prednisone as well from urgent care.  Asthma/Respiratory Symptom History: She remains on the Trelegy 1 puff once daily.  She has been using her nebulizer a lot with this current flare, but before that she had actually done very well without the albuterol use.  She feels around 75% better, although her voice has not completely recovered.  There is  some improvement with the use of her rescue inhaler.   Allergic Rhinitis Symptom History: She remains on the Flonase daily. She also Xhance to add when it is really bad.  She thinks that her insurance is continuing to cover the [place]. She has not had any problems getting it at all. She has been using her  nasal saline somewhat regularly.   GERD Symptom History: She remains on the pantoprazole as well as her dietary changes.  She thinks that her GERD is under fair control at this time.   Her husband has bladder cancer and is cancer free thus far. He had a CT done on Monday and they have a f/u tomorrow.   Otherwise, there have been no changes to her past medical history, surgical history, family history, or social  history.    Review of Systems  Constitutional: Negative.  Negative for chills, fever, malaise/fatigue and weight loss.  HENT: Negative.  Negative for congestion, ear discharge and ear pain.   Eyes:  Negative for pain, discharge and redness.  Respiratory:  Negative for cough, sputum production, shortness of breath and wheezing.   Cardiovascular: Negative.  Negative for chest pain and palpitations.  Gastrointestinal:  Negative for abdominal pain, constipation, diarrhea, heartburn, nausea and vomiting.  Skin: Negative.  Negative for itching and rash.  Neurological:  Negative for dizziness and headaches.  Endo/Heme/Allergies:  Negative for environmental allergies. Does not bruise/bleed easily.       Objective:   Blood pressure 122/70, pulse 84, temperature 98.3 F (36.8 C), temperature source Temporal, resp. rate 16, height '5\' 6"'$  (1.676 m), weight 161 lb 12.8 oz (73.4 kg), SpO2 99 %. Body mass index is 26.12 kg/m.    Physical Exam Vitals reviewed.  Constitutional:      Appearance: She is well-developed.     Comments: Adorable as always. Hoarse but she sounds better than she did in the past.   HENT:     Head: Normocephalic and atraumatic.     Right Ear: Tympanic membrane, ear canal and external ear normal.     Left Ear: Tympanic membrane, ear canal and external ear normal.     Nose: No nasal deformity, septal deviation, mucosal edema or rhinorrhea.     Right Turbinates: Enlarged, swollen and pale.     Left Turbinates: Enlarged, swollen and pale.     Right Sinus: No maxillary sinus tenderness or frontal sinus tenderness.     Left Sinus: No maxillary sinus tenderness or frontal sinus tenderness.     Mouth/Throat:     Mouth: Mucous membranes are not pale and not dry.     Pharynx: Uvula midline.  Eyes:     General:        Right eye: No discharge.        Left eye: No discharge.     Conjunctiva/sclera: Conjunctivae normal.     Right eye: Right conjunctiva is not injected. No  chemosis.    Left eye: Left conjunctiva is not injected. No chemosis.    Pupils: Pupils are equal, round, and reactive to light.  Cardiovascular:     Rate and Rhythm: Normal rate and regular rhythm.     Heart sounds: Normal heart sounds.  Pulmonary:     Effort: Pulmonary effort is normal. No tachypnea, accessory muscle usage or respiratory distress.     Breath sounds: Normal breath sounds. No wheezing, rhonchi or rales.     Comments: Moving air well in all lung fields. No increased work of breathing noted.  Chest:     Chest wall: No tenderness.  Lymphadenopathy:     Cervical: No cervical adenopathy.  Skin:    General: Skin is warm.     Capillary Refill: Capillary refill takes less than 2 seconds.     Coloration: Skin is not pale.     Findings: No abrasion,  erythema, petechiae or rash. Rash is not papular, urticarial or vesicular.     Comments: No eczematous or urticarial lesions noted.   Neurological:     Mental Status: She is alert.  Psychiatric:        Behavior: Behavior is cooperative.      Diagnostic studies:    Spirometry: results normal (FEV1: 1.64/76%, FVC: 2.04/74%, FEV1/FVC: 80%).    Spirometry consistent with normal pattern.   Allergy Studies: none       [name], MD  [facility]

## 2022-01-19 NOTE — Patient Instructions (Addendum)
Allergic rhintiis - Continue with Xhance nasal spray to 2 sprays in each nostril twice a day to help control nasal congestion AS NEEDED (when Flonase  - Continue with Xyzal 5 mg 1-2 a day as needed for a runny nose. - Consider saline nasal rinses as needed for nasal symptoms. Use this before any medicated nasal sprays for best result - Continue allergen immunotherapy and have access to an epinephrine auto-injector set  2. Moderate persistent asthma, uncomplicated  - Continue Trelegy 100-1 puff once a day to prevent cough or wheeze - Continue albuterol 2 puffs every 4 hours as needed for cough or wheeze OR Instead use albuterol 0.083% solution via nebulizer one unit vial every 4 hours as needed for cough or wheeze  3. Reflux - Continue dietary and lifestyle modifications as listed below - Continue with pantoprazole 20 mg once a day to control reflux  4. Return in about 6 months (around 07/21/2022).    Please inform us of any Emergency Department visits, hospitalizations, or changes in symptoms. Call us before going to the ED for breathing or allergy symptoms since we might be able to fit you in for a sick visit. Feel free to contact us anytime with any questions, problems, or concerns.  It was a pleasure to see you again today! We love you so much!   Websites that have reliable patient information: 1. American Academy of Asthma, Allergy, and Immunology: www.aaaai.org 2. Food Allergy Research and Education (FARE): foodallergy.org 3. Mothers of Asthmatics: http://www.asthmacommunitynetwork.org 4. American College of Allergy, Asthma, and Immunology: www.acaai.org   COVID-19 Vaccine Information can be found at: ShippingScam.co.uk For questions related to vaccine distribution or appointments, please email [email or web address] or call [phone].   We realize that you might be concerned about having an allergic reaction to the  COVID19 vaccines. To help with that concern, WE ARE OFFERING THE COVID19 VACCINES IN OUR OFFICE! Ask the front desk for dates!     "Like" [place] on Facebook and Instagram for our latest updates!      A healthy democracy works best when [employer] participate! Make sure you are registered to vote! If you have moved or changed any of your contact information, you will need to get this updated before voting!  In some cases, you MAY be able to register to vote online: [email or web address]

## 2022-02-11 ENCOUNTER — Ambulatory Visit (INDEPENDENT_AMBULATORY_CARE_PROVIDER_SITE_OTHER): Payer: Medicare Other

## 2022-02-11 DIAGNOSIS — J309 Allergic rhinitis, unspecified: Secondary | ICD-10-CM | POA: Diagnosis not present

## 2022-02-23 DIAGNOSIS — L438 Other lichen planus: Secondary | ICD-10-CM | POA: Diagnosis not present

## 2022-02-23 DIAGNOSIS — L218 Other seborrheic dermatitis: Secondary | ICD-10-CM | POA: Diagnosis not present

## 2022-02-23 DIAGNOSIS — L821 Other seborrheic keratosis: Secondary | ICD-10-CM | POA: Diagnosis not present

## 2022-03-08 ENCOUNTER — Other Ambulatory Visit: Payer: Self-pay

## 2022-03-08 DIAGNOSIS — E559 Vitamin D deficiency, unspecified: Secondary | ICD-10-CM | POA: Diagnosis not present

## 2022-03-08 DIAGNOSIS — Z1211 Encounter for screening for malignant neoplasm of colon: Secondary | ICD-10-CM | POA: Diagnosis not present

## 2022-03-08 DIAGNOSIS — R7303 Prediabetes: Secondary | ICD-10-CM

## 2022-03-08 DIAGNOSIS — I10 Essential (primary) hypertension: Secondary | ICD-10-CM

## 2022-03-08 DIAGNOSIS — E785 Hyperlipidemia, unspecified: Secondary | ICD-10-CM

## 2022-03-09 LAB — LIPID PANEL
Chol/HDL Ratio: 3.6 ratio (ref 0.0–4.4)
Cholesterol, Total: 208 mg/dL — ABNORMAL HIGH (ref 100–199)
HDL: 57 mg/dL (ref >39)
LDL Chol Calc (NIH): 141 mg/dL — ABNORMAL HIGH (ref 0–99)
Triglycerides: 54 mg/dL (ref 0–149)
VLDL Cholesterol Cal: 10 mg/dL (ref 5–40)

## 2022-03-09 LAB — CMP14+EGFR
ALT: 28 IU/L (ref 0–32)
AST: 23 IU/L (ref 0–40)
Albumin/Globulin Ratio: 1.6 (ref 1.2–2.2)
Albumin: 4.5 g/dL (ref 3.9–4.9)
Alkaline Phosphatase: 116 IU/L (ref 44–121)
BUN/Creatinine Ratio: 14 (ref 12–28)
BUN: 11 mg/dL (ref 8–27)
Bilirubin Total: 0.3 mg/dL (ref 0.0–1.2)
CO2: 23 mmol/L (ref 20–29)
Calcium: 9.5 mg/dL (ref 8.7–10.3)
Chloride: 104 mmol/L (ref 96–106)
Creatinine, Ser: 0.8 mg/dL (ref 0.57–1.00)
Globulin, Total: 2.8 g/dL (ref 1.5–4.5)
Glucose: 95 mg/dL (ref 70–99)
Potassium: 4.2 mmol/L (ref 3.5–5.2)
Sodium: 142 mmol/L (ref 134–144)
Total Protein: 7.3 g/dL (ref 6.0–8.5)
eGFR: 82 mL/min/{1.73_m2} (ref >59)

## 2022-03-09 LAB — TSH: TSH: 1.53 u[IU]/mL (ref 0.450–4.500)

## 2022-03-09 LAB — CBC
Hematocrit: 39.6 % (ref 34.0–46.6)
Hemoglobin: 12.7 g/dL (ref 11.1–15.9)
MCH: 26.6 pg (ref 26.6–33.0)
MCHC: 32.1 g/dL (ref 31.5–35.7)
MCV: 83 fL (ref 79–97)
Platelets: 279 10*3/uL (ref 150–450)
RBC: 4.77 x10E6/uL (ref 3.77–5.28)
RDW: 13.1 % (ref 11.7–15.4)
WBC: 6.3 10*3/uL (ref 3.4–10.8)

## 2022-03-09 LAB — HEMOGLOBIN A1C
Est. average glucose Bld gHb Est-mCnc: 131 mg/dL
Hgb A1c MFr Bld: 6.2 % — ABNORMAL HIGH (ref 4.8–5.6)

## 2022-03-09 LAB — VITAMIN D 25 HYDROXY (VIT D DEFICIENCY, FRACTURES): Vit D, 25-Hydroxy: 30.6 ng/mL (ref 30.0–100.0)

## 2022-03-10 ENCOUNTER — Encounter: Payer: Self-pay | Admitting: Family Medicine

## 2022-03-10 LAB — FECAL OCCULT BLOOD, IMMUNOCHEMICAL: Fecal Occult Bld: NEGATIVE

## 2022-03-11 ENCOUNTER — Ambulatory Visit (INDEPENDENT_AMBULATORY_CARE_PROVIDER_SITE_OTHER): Payer: Medicare Other

## 2022-03-11 DIAGNOSIS — J309 Allergic rhinitis, unspecified: Secondary | ICD-10-CM | POA: Diagnosis not present

## 2022-03-17 ENCOUNTER — Encounter: Payer: Self-pay | Admitting: Allergy & Immunology

## 2022-03-18 ENCOUNTER — Other Ambulatory Visit: Payer: Self-pay

## 2022-03-18 ENCOUNTER — Encounter: Payer: Self-pay | Admitting: Allergy & Immunology

## 2022-03-18 ENCOUNTER — Ambulatory Visit (INDEPENDENT_AMBULATORY_CARE_PROVIDER_SITE_OTHER): Payer: Medicare Other | Admitting: Allergy & Immunology

## 2022-03-18 DIAGNOSIS — J3089 Other allergic rhinitis: Secondary | ICD-10-CM | POA: Diagnosis not present

## 2022-03-18 DIAGNOSIS — E507 Other ocular manifestations of vitamin A deficiency: Secondary | ICD-10-CM | POA: Diagnosis not present

## 2022-03-18 DIAGNOSIS — K219 Gastro-esophageal reflux disease without esophagitis: Secondary | ICD-10-CM | POA: Diagnosis not present

## 2022-03-18 DIAGNOSIS — R0602 Shortness of breath: Secondary | ICD-10-CM | POA: Diagnosis not present

## 2022-03-18 DIAGNOSIS — H1033 Unspecified acute conjunctivitis, bilateral: Secondary | ICD-10-CM | POA: Diagnosis not present

## 2022-03-18 DIAGNOSIS — J040 Acute laryngitis: Secondary | ICD-10-CM

## 2022-03-18 DIAGNOSIS — J4540 Moderate persistent asthma, uncomplicated: Secondary | ICD-10-CM | POA: Diagnosis not present

## 2022-03-18 DIAGNOSIS — J302 Other seasonal allergic rhinitis: Secondary | ICD-10-CM

## 2022-03-18 MED ORDER — PREDNISONE 10 MG PO TABS: ORAL_TABLET | ORAL | 0 refills | Status: DC

## 2022-03-18 MED ORDER — GUAIFENESIN-CODEINE 100-10 MG/5ML PO SOLN: 10.0000 mL | Freq: Three times a day (TID) | ORAL | 0 refills | Status: DC | PRN

## 2022-03-18 NOTE — Progress Notes (Signed)
RE: Denise Werner MRN: [id number] DOB: Jun 24, 1955 Date of Telemedicine Visit: 03/18/2022  Referring provider: Fayrene Helper, MD Primary care provider: Fayrene Helper, MD  Chief Complaint: Sinusitis and Other [place] Perna is not covered anymore)   Telemedicine Follow Up Visit via Telephone: I connected with Denise Werner for a follow up on 03/18/22 by telephone and verified that [name] using two identifiers.   I discussed the limitations, risks, security and privacy concerns of performing an evaluation and management service by telephone and the availability of in person appointments. I also discussed with the patient that there may be a patient responsible charge related to this service. The patient expressed understanding and agreed to proceed.  Patient is at home.  Provider is at the office.  Visit start time: 10:54 AM Visit end time: 11:20 AM Insurance consent/check in by: Verdis Frederickson Medical consent and medical assistant/nurse: Verdis Frederickson  History of Present Illness:  She is a 67 y.o. female, who is being followed for persistent asthma as well as allergic rhinitis and reflux. Her previous allergy office visit was in December 2023 with myself.  At that visit, we continued with Xhance 2 sprays per nostril as needed as well as Xyzal and nasal saline rinses.  We also continue with allergen immunotherapy.  She had recently been treated for sinusitis with azithromycin and prednisone.  We added on Augmentin for more extensive coverage because she was continuing to have problems.  We continue with Trelegy 1 puff once daily.  Since last visit, she has largely done well.  However, she took care of her granddaughter a couple of days ago and then the following morning she woke up with a sore throat.  She does not remember having a fever.  Sore throat got a lot worse and she ended up developing some postnasal drip and hoarseness.  She really cannot even talk very  loudly at all today.  She has started Mucinex twice a day without improvement.  She is eating and drinking okay.  She does endorse bilateral sinus pressure and her coughing is gotten a lot worse.  She is using her nebulizer.  She remains on the Trelegy 1 puff once daily.  Otherwise, there have been no changes to her past medical history, surgical history, family history, or social history.  Assessment and Plan:  Denise Werner is a 67 y.o. female with:  Moderate persistent asthma, uncomplicated    Seasonal and perennial allergic rhinitis (trees, weeds, grasses, indoor molds, outdoor molds, dust mites, cat and cockroach) - on allergen immunotherapy   Bilateral conjunctivitis with xerophthalmia - diagnosed recently with clogged tear ducts bilaterally   Shortness of breath - full cardiac work-up done   Acute laryngitis - adding prednisone and cough suppressant today (deferring on antibiotic course)   We are going to start her on a prednisone burst today.  We are also giving her a prescription cough medicine.  I do not think she needs an antibiotic at this point given the short time course.  She is going to keep in touch with [place] about how she is doing and we can make changes accordingly.  Diagnostics: None.  Medication List:  Current Outpatient Medications  Medication Sig Dispense Refill   albuterol (VENTOLIN HFA) 108 (90 Base) MCG/ACT inhaler Inhale 2 puffs into the lungs every 6 (six) hours as needed for wheezing or shortness of breath. 18 g 1   amitriptyline (ELAVIL) 25 MG tablet Take 25 mg by mouth at  bedtime.     amLODipine (NORVASC) 5 MG tablet TAKE 1 TABLET BY MOUTH EVERY DAY 90 tablet 1   Calcium Carbonate-Vit D-Min (CALCIUM 1200) 1200-1000 MG-UNIT CHEW Chew 2 tablets by mouth daily.     clobetasol (TEMOVATE) 0.05 % external solution Apply topically.     Fluticasone-Umeclidin-Vilant (TRELEGY ELLIPTA) 100-62.5-25 MCG/ACT AEPB Inhale 1 puff into the lungs daily. 28 each 5   ibuprofen  (ADVIL) 600 MG tablet TAKE ONE TABLET BY MOUTH EVERY 8 HOURS AS NEEDED FOR BACK AND LEFT KNEE PAIN 30 tablet 0   levalbuterol (XOPENEX HFA) 45 MCG/ACT inhaler Inhale 2 puffs into the lungs every 6 (six) hours as needed for wheezing. 15 g 1   levalbuterol (XOPENEX) 1.25 MG/3ML nebulizer solution Take 1.25 mg by nebulization every 4 (four) hours as needed for wheezing. 72 mL 3   levocetirizine (XYZAL) 5 MG tablet Take 1 tablet (5 mg total) by mouth 2 (two) times daily. 60 tablet 5   pantoprazole (PROTONIX) 20 MG tablet Take 1 tablet (20 mg total) by mouth daily. 90 tablet 3   rosuvastatin (CRESTOR) 10 MG tablet TAKE 1 TABLET BY MOUTH EVERY DAY 90 tablet 1   zoledronic acid (RECLAST) 5 MG/100ML SOLN injection Inject 5 mg into the vein.     EPINEPHrine (EPIPEN 2-PAK) 0.3 mg/0.3 mL IJ SOAJ injection Inject 0.3 mg into the muscle as needed for anaphylaxis. (Patient not taking: Reported on 03/18/2022) 2 each 1   Fluticasone Propionate (XHANCE) 93 MCG/ACT EXHU Place 2 puffs into the nose in the morning and at bedtime. (Patient not taking: Reported on 03/18/2022) 16 mL 5   No current facility-administered medications for this visit.   Allergies: Allergies  Allergen Reactions   Hydrocodone Nausea And Vomiting and Nausea Only    Also causes dizziness and states feels like she has an outer body experience. Also causes dizziness and states feels like she has an outer body experience.   Other Rash   Sulfonamide Derivatives Rash   I reviewed her past medical history, social history, family history, and environmental history and no significant changes have been reported from previous visits.  Review of Systems  Constitutional:  Negative for activity change and appetite change.  HENT:  Positive for sinus pressure, sinus pain, sore throat, trouble swallowing and voice change. Negative for congestion, postnasal drip, rhinorrhea and sneezing.   Eyes:  Negative for pain, discharge, redness and itching.   Respiratory:  Negative for shortness of breath, wheezing and stridor.   Gastrointestinal:  Negative for diarrhea, nausea and vomiting.  Endocrine: Negative for cold intolerance and heat intolerance.  Musculoskeletal:  Negative for arthralgias, joint swelling and myalgias.  Skin:  Negative for rash.  Allergic/Immunologic: Positive for environmental allergies. Negative for food allergies.    Objective:  Physical exam not obtained as encounter was done via telephone.   Previous notes and tests were reviewed.  I discussed the assessment and treatment plan with the patient. The patient was provided an opportunity to ask questions and all were answered. The patient agreed with the plan and demonstrated an understanding of the instructions.   The patient was advised to call back or seek an in-person evaluation if the symptoms worsen or if the condition fails to improve as anticipated.  I provided 26 minutes of non-face-to-face time during this encounter.  It was my pleasure to participate in Denise Werner care today. Please feel free to contact me with any questions or concerns.   Sincerely,  Valentina Shaggy,  MD

## 2022-03-21 ENCOUNTER — Encounter: Payer: Self-pay | Admitting: Family Medicine

## 2022-03-22 ENCOUNTER — Ambulatory Visit (INDEPENDENT_AMBULATORY_CARE_PROVIDER_SITE_OTHER): Payer: Medicare Other | Admitting: Family Medicine

## 2022-03-22 ENCOUNTER — Encounter: Payer: Self-pay | Admitting: Family Medicine

## 2022-03-22 VITALS — BP 150/90 | HR 76 | Ht 66.0 in | Wt 162.0 lb

## 2022-03-22 DIAGNOSIS — R7303 Prediabetes: Secondary | ICD-10-CM

## 2022-03-22 DIAGNOSIS — I10 Essential (primary) hypertension: Secondary | ICD-10-CM

## 2022-03-22 DIAGNOSIS — E785 Hyperlipidemia, unspecified: Secondary | ICD-10-CM | POA: Diagnosis not present

## 2022-03-22 DIAGNOSIS — J0100 Acute maxillary sinusitis, unspecified: Secondary | ICD-10-CM | POA: Diagnosis not present

## 2022-03-22 DIAGNOSIS — Z0001 Encounter for general adult medical examination with abnormal findings: Secondary | ICD-10-CM

## 2022-03-22 MED ORDER — AMITRIPTYLINE HCL 25 MG PO TABS: 25.0000 mg | ORAL_TABLET | Freq: Every evening | ORAL | 1 refills | Status: DC | PRN

## 2022-03-22 MED ORDER — AZITHROMYCIN 250 MG PO TABS: ORAL_TABLET | ORAL | 0 refills | Status: AC

## 2022-03-22 MED ORDER — AMLODIPINE BESYLATE 5 MG PO TABS: 5.0000 mg | ORAL_TABLET | Freq: Every day | ORAL | 3 refills | Status: DC

## 2022-03-22 NOTE — Patient Instructions (Addendum)
Nurse BP check in 4 weeks, blood pressure is high today  F/U with MD in 5.5 months, call if you need me sooner  Fasting lipid, cmp and eGFR and hBA1c 3 to 5 days before next appointment  Azithromycin, an antibiotic is prescribed for sinusitis  Nurse pls provide samples of Trelegy 100-62.5-25 if we have , pls give pt three/ four  I will refer you to Nutrition ed once I confirm  that your ins will cover, I will message you  Increase crestor to every night , as cholesterol is too high  Increase vegetables, fruit, and reduce sweets, sugar and white starches, cholesterol and blood sugar have increase , otherwide excellent labs, normal organ function

## 2022-03-27 ENCOUNTER — Encounter: Payer: Self-pay | Admitting: Family Medicine

## 2022-03-27 DIAGNOSIS — J0100 Acute maxillary sinusitis, unspecified: Secondary | ICD-10-CM | POA: Insufficient documentation

## 2022-03-27 DIAGNOSIS — Z0001 Encounter for general adult medical examination with abnormal findings: Secondary | ICD-10-CM | POA: Insufficient documentation

## 2022-03-27 NOTE — Assessment & Plan Note (Signed)
Z pack prescribed 

## 2022-03-27 NOTE — Assessment & Plan Note (Signed)
Hyperlipidemia:Low fat diet discussed and encouraged.   Lipid Panel  Lab Results  Component Value Date   CHOL 208 (H) 03/08/2022   HDL 57 03/08/2022   LDLCALC 141 (H) 03/08/2022   TRIG 54 03/08/2022   CHOLHDL 3.6 03/08/2022     Elevated and uncontrolled , start daily crestor Updated lab needed at/ before next visit.

## 2022-03-27 NOTE — Progress Notes (Signed)
Denise Werner     MRN: [id number]      DOB: 01-30-1956  HPI: Patient is in for annual physical exam. 1 week h/o sinus pressure excess cough, yellow nasal drainage, not resolving but worsening with increased cough, no documented fever, has had chills, covid test negative Recent labs,  are reviewed. Immunization is reviewed , and  is up to date  PE: BP (!) 150/90   Pulse 76   Ht '5\' 6"'$  (1.676 m)   Wt 162 lb (73.5 kg)   SpO2 97%   BMI 26.15 kg/m   Pleasant  female, alert and oriented x 3, in no cardio-pulmonary distress. Afebrile. HEENT No facial trauma or asymetry. Maxillary sinus tenderness. TM clear,  no cervical adenopathy Extra occullar muscles intact.. External ears normal,  TM clear B Neck: supple, no adenopathy,JVD or thyromegaly.No bruits.  Chest: Clear to ascultation bilaterally.No crackles or wheezes. Non tender to palpation  Cardiovascular system; Heart sounds normal,  S1 and  S2 ,no S3.  No murmur, or thrill. Apical beat not displaced Peripheral pulses normal.  Abdomen: Soft, non tender, no organomegaly or masses. No bruits. Bowel sounds normal. No guarding, tenderness or rebound.    Musculoskeletal exam: Full ROM of spine, hips , shoulders and knees. No deformity ,swelling or crepitus noted. No muscle wasting or atrophy.   Neurologic: Cranial nerves 2 to 12 intact. Power, tone ,sensation and reflexes normal throughout. No disturbance in gait. No tremor.  Skin: Intact, no ulceration, erythema , scaling or rash noted. Pigmentation normal throughout  Psych; Normal mood and affect. Judgement and concentration normal   Assessment & Plan:  Encounter for Medicare annual examination with abnormal findings Annual exam as documented. Counseling done  re healthy lifestyle involving commitment to 150 minutes exercise per week, heart healthy diet, and attaining healthy weight.The importance of adequate sleep also discussed. Regular seat belt use  and home safety, is also discussed. Changes in health habits are decided on by the patient with goals and time frames  set for achieving them. Immunization and cancer screening needs are specifically addressed at this visit.   Maxillary sinusitis, acute Z pack prescribed  HTN (hypertension) Elevated at viusit, will reassess in 4  weeks with Nurse BP check, wil need med adjustment if not normal DASH diet and commitment to daily physical activity for a minimum of 30 minutes discussed and encouraged, as a part of hypertension management. The importance of attaining a healthy weight is also discussed.     03/22/2022    1:18 PM 03/22/2022    1:17 PM 01/19/2022    8:39 AM 11/17/2021    1:05 PM 10/05/2021   10:53 AM 10/05/2021   10:09 AM 09/21/2021   10:47 AM  BP/Weight  Systolic BP Q000111Q 0000000 123XX123 [id number]  Diastolic BP 90 69 70 76 66 75 89  Wt. (Lbs)  162 161.8 159     BMI  26.15 kg/m2 26.12 kg/m2 25.66 kg/m2           Prediabetes Patient educated about the importance of limiting  Carbohydrate intake , the need to commit to daily physical activity for a minimum of 30 minutes , and to commit weight loss. The fact that changes in all these areas will reduce or eliminate all together the development of diabetes is stressed.      Latest Ref Rng & Units 03/08/2022    1:26 PM 08/24/2021   10:09 AM 02/12/2021    9:12 AM  08/06/2020    9:01 AM 03/16/2020    1:21 PM  Diabetic Labs  HbA1c 4.8 - 5.6 % 6.2  6.0  5.7  6.0    Chol 100 - 199 mg/dL 208  223  136  146    HDL >39 mg/dL 57  55  55  56    Calc LDL 0 - 99 mg/dL 141  156  73  80    Triglycerides 0 - 149 mg/dL 54  67  31  46    Creatinine 0.57 - 1.00 mg/dL 0.80  0.83  0.85  0.95  0.84       03/22/2022    1:18 PM 03/22/2022    1:17 PM 01/19/2022    8:39 AM 11/17/2021    1:05 PM 10/05/2021   10:53 AM 10/05/2021   10:09 AM 09/21/2021   10:47 AM  BP/Weight  Systolic BP Q000111Q 0000000 [id number]  Diastolic BP 90 69 70 76 66 75 89  Wt.  (Lbs)  162 161.8 159     BMI  26.15 kg/m2 26.12 kg/m2 25.66 kg/m2          No data to display           Deteriorated refer medical nutrition if covered  Hyperlipidemia LDL goal <100 Hyperlipidemia:Low fat diet discussed and encouraged.   Lipid Panel  Lab Results  Component Value Date   CHOL 208 (H) 03/08/2022   HDL 57 03/08/2022   LDLCALC 141 (H) 03/08/2022   TRIG 54 03/08/2022   CHOLHDL 3.6 03/08/2022     Elevated and uncontrolled , start daily crestor Updated lab needed at/ before next visit.

## 2022-03-27 NOTE — Assessment & Plan Note (Signed)

## 2022-03-27 NOTE — Assessment & Plan Note (Signed)
Elevated at viusit, will reassess in 4  weeks with Nurse BP check, wil need med adjustment if not normal DASH diet and commitment to daily physical activity for a minimum of 30 minutes discussed and encouraged, as a part of hypertension management. The importance of attaining a healthy weight is also discussed.     03/22/2022    1:18 PM 03/22/2022    1:17 PM 01/19/2022    8:39 AM 11/17/2021    1:05 PM 10/05/2021   10:53 AM 10/05/2021   10:09 AM 09/21/2021   10:47 AM  BP/Weight  Systolic BP Q000111Q 0000000 [id number]  Diastolic BP 90 69 70 76 66 75 89  Wt. (Lbs)  162 161.8 159     BMI  26.15 kg/m2 26.12 kg/m2 25.66 kg/m2

## 2022-03-27 NOTE — Assessment & Plan Note (Signed)
Patient educated about the importance of limiting  Carbohydrate intake , the need to commit to daily physical activity for a minimum of 30 minutes , and to commit weight loss. The fact that changes in all these areas will reduce or eliminate all together the development of diabetes is stressed.      Latest Ref Rng & Units 03/08/2022    1:26 PM 08/24/2021   10:09 AM 02/12/2021    9:12 AM 08/06/2020    9:01 AM 03/16/2020    1:21 PM  Diabetic Labs  HbA1c 4.8 - 5.6 % 6.2  6.0  5.7  6.0    Chol 100 - 199 mg/dL 208  223  136  146    HDL >39 mg/dL 57  55  55  56    Calc LDL 0 - 99 mg/dL 141  156  73  80    Triglycerides 0 - 149 mg/dL 54  67  31  46    Creatinine 0.57 - 1.00 mg/dL 0.80  0.83  0.85  0.95  0.84       03/22/2022    1:18 PM 03/22/2022    1:17 PM 01/19/2022    8:39 AM 11/17/2021    1:05 PM 10/05/2021   10:53 AM 10/05/2021   10:09 AM 09/21/2021   10:47 AM  BP/Weight  Systolic BP Q000111Q 0000000 [id number] [id number]  Diastolic BP 90 69 70 76 66 75 89  Wt. (Lbs)  162 161.8 159     BMI  26.15 kg/m2 26.12 kg/m2 25.66 kg/m2          No data to display           Deteriorated refer medical nutrition if covered

## 2022-03-29 ENCOUNTER — Encounter: Payer: Self-pay | Admitting: Family Medicine

## 2022-03-29 NOTE — Addendum Note (Signed)
Addended by: Fayrene Helper on: 03/29/2022 05:44 PM   Modules accepted: Orders

## 2022-03-31 ENCOUNTER — Encounter: Payer: Self-pay | Admitting: Radiology

## 2022-04-08 ENCOUNTER — Ambulatory Visit (INDEPENDENT_AMBULATORY_CARE_PROVIDER_SITE_OTHER): Payer: Medicare Other

## 2022-04-08 DIAGNOSIS — J309 Allergic rhinitis, unspecified: Secondary | ICD-10-CM | POA: Diagnosis not present

## 2022-04-15 ENCOUNTER — Ambulatory Visit (INDEPENDENT_AMBULATORY_CARE_PROVIDER_SITE_OTHER): Payer: Medicare Other | Admitting: *Deleted

## 2022-04-15 ENCOUNTER — Other Ambulatory Visit: Payer: Self-pay | Admitting: *Deleted

## 2022-04-15 DIAGNOSIS — J309 Allergic rhinitis, unspecified: Secondary | ICD-10-CM

## 2022-04-15 NOTE — Telephone Encounter (Signed)
Error

## 2022-04-18 ENCOUNTER — Ambulatory Visit
Admission: RE | Admit: 2022-04-18 | Discharge: 2022-04-18 | Disposition: A | Discharge status: Home / Self Care | Payer: Medicare Other | Source: Ambulatory Visit | Attending: Family Medicine | Admitting: Family Medicine

## 2022-04-18 DIAGNOSIS — Z1231 Encounter for screening mammogram for malignant neoplasm of breast: Secondary | ICD-10-CM | POA: Diagnosis not present

## 2022-04-22 ENCOUNTER — Ambulatory Visit (INDEPENDENT_AMBULATORY_CARE_PROVIDER_SITE_OTHER): Payer: Medicare Other

## 2022-04-22 DIAGNOSIS — J309 Allergic rhinitis, unspecified: Secondary | ICD-10-CM | POA: Diagnosis not present

## 2022-04-25 ENCOUNTER — Encounter: Payer: Self-pay | Admitting: Family Medicine

## 2022-04-26 ENCOUNTER — Telehealth (INDEPENDENT_AMBULATORY_CARE_PROVIDER_SITE_OTHER): Payer: Medicare Other | Admitting: Family Medicine

## 2022-04-26 ENCOUNTER — Ambulatory Visit: Payer: Medicare Other

## 2022-04-26 ENCOUNTER — Encounter: Payer: Self-pay | Admitting: Family Medicine

## 2022-04-26 VITALS — Temp 100.7°F | Ht 66.0 in | Wt 153.0 lb

## 2022-04-26 DIAGNOSIS — U071 COVID-19: Secondary | ICD-10-CM | POA: Diagnosis not present

## 2022-04-26 MED ORDER — AZELASTINE-FLUTICASONE 137-50 MCG/ACT NA SUSP: 1.0000 | Freq: Two times a day (BID) | NASAL | 1 refills | Status: DC

## 2022-04-26 MED ORDER — LEVOCETIRIZINE DIHYDROCHLORIDE 5 MG PO TABS: 5.0000 mg | ORAL_TABLET | Freq: Every evening | ORAL | 1 refills | Status: DC

## 2022-04-26 MED ORDER — NIRMATRELVIR/RITONAVIR (PAXLOVID)TABLET: 3.0000 | ORAL_TABLET | Freq: Two times a day (BID) | ORAL | 0 refills | Status: AC

## 2022-04-26 MED ORDER — BENZONATATE 100 MG PO CAPS: 100.0000 mg | ORAL_CAPSULE | Freq: Two times a day (BID) | ORAL | 0 refills | Status: DC | PRN

## 2022-04-26 NOTE — Assessment & Plan Note (Addendum)
At home positive Covid test. Paxlovid x 5 days, benzonatate, azelastine-fluticasone 137-50 mcg nasal spray, xyzal 5 mg, Advise to continue using albuterol for SOB. Discussed symptomatic treatment, rest, increase oral fluid intake. Take OTC tylenol for fever, Follow-up for worsening or persistent symptoms.Patient verbalizes understanding regarding plan of care and all questions answered

## 2022-04-26 NOTE — Progress Notes (Signed)
Virtual Visit via Video Note  I connected with Denise Werner on 04/26/22 at  9:20 AM EDT by a video enabled telemedicine application and verified that [name] using two identifiers.  Patient Location: Home Provider Location: Office/Clinic  I discussed the limitations, risks, security, and privacy concerns of performing an evaluation and management service by video and the availability of in person appointments. I also discussed with the patient that there may be a patient responsible charge related to this service. The patient expressed understanding and agreed to proceed.  Subjective: PCP: Fayrene Helper, MD  Chief Complaint  Patient presents with   Covid Positive    Patient tested + for covid yesterday. Complains of body aches, fatigue, congestion and runny nose starting yesterday.    Patient complains of evaluation of cough, and fever of 100.7 symptoms started yesterday. At home positive Covid test.  Patient describes symptoms of shortness of breath on exertion, chills without rigors, nasal congestion, cough, fatigue, headache, malaise, nausea, and sputum production. Symptoms began 1 day ago and are gradually worsening since that time. Patient denies chest pain ,vomiting, abdominal pain. Treatment thus far includes OTC analgesics/antipyretics: somewhat effective. Past pulmonary history is significant for asthma       ROS: Per HPI  Current Outpatient Medications:    albuterol (VENTOLIN HFA) 108 (90 Base) MCG/ACT inhaler, Inhale 2 puffs into the lungs every 6 (six) hours as needed for wheezing or shortness of breath., Disp: 18 g, Rfl: 1   amitriptyline (ELAVIL) 25 MG tablet, Take 1 tablet (25 mg total) by mouth at bedtime as needed for sleep., Disp: 90 tablet, Rfl: 1   amLODipine (NORVASC) 5 MG tablet, Take 1 tablet (5 mg total) by mouth daily., Disp: 90 tablet, Rfl: 3   Azelastine-Fluticasone 137-50 MCG/ACT SUSP, Place 1 spray into the nose  every 12 (twelve) hours., Disp: 23 g, Rfl: 1   benzonatate (TESSALON) 100 MG capsule, Take 1 capsule (100 mg total) by mouth 2 (two) times daily as needed for cough., Disp: 20 capsule, Rfl: 0   Calcium Carbonate-Vit D-Min (CALCIUM 1200) 1200-1000 MG-UNIT CHEW, Chew 2 tablets by mouth daily., Disp: , Rfl:    clobetasol (TEMOVATE) 0.05 % external solution, Apply topically., Disp: , Rfl:    EPINEPHrine (EPIPEN 2-PAK) 0.3 mg/0.3 mL IJ SOAJ injection, Inject 0.3 mg into the muscle as needed for anaphylaxis., Disp: 2 each, Rfl: 1   Fluticasone-Umeclidin-Vilant (TRELEGY ELLIPTA) 100-62.5-25 MCG/ACT AEPB, Inhale 1 puff into the lungs daily., Disp: 28 each, Rfl: 5   ibuprofen (ADVIL) 600 MG tablet, TAKE ONE TABLET BY MOUTH EVERY 8 HOURS AS NEEDED FOR BACK AND LEFT KNEE PAIN, Disp: 30 tablet, Rfl: 0   levalbuterol (XOPENEX HFA) 45 MCG/ACT inhaler, Inhale 2 puffs into the lungs every 6 (six) hours as needed for wheezing., Disp: 15 g, Rfl: 1   levalbuterol (XOPENEX) 1.25 MG/3ML nebulizer solution, Take 1.25 mg by nebulization every 4 (four) hours as needed for wheezing., Disp: 72 mL, Rfl: 3   levocetirizine (XYZAL) 5 MG tablet, Take 1 tablet (5 mg total) by mouth every evening., Disp: 15 tablet, Rfl: 1   nirmatrelvir/ritonavir (PAXLOVID) 20 x 150 MG & 10 x 100MG  TABS, Take 3 tablets by mouth 2 (two) times daily for 5 days. (Take nirmatrelvir 150 mg two tablets twice daily for 5 days and ritonavir 100 mg one tablet twice daily for 5 days) Patient GFR is 82, Disp: 30 tablet, Rfl: 0   pantoprazole (  PROTONIX) 20 MG tablet, Take 1 tablet (20 mg total) by mouth daily., Disp: 90 tablet, Rfl: 3   rosuvastatin (CRESTOR) 10 MG tablet, TAKE 1 TABLET BY MOUTH EVERY DAY, Disp: 90 tablet, Rfl: 1   zoledronic acid (RECLAST) 5 MG/100ML SOLN injection, Inject 5 mg into the vein., Disp: , Rfl:   Observations/Objective: Today's Vitals   04/26/22 0927  Temp: (!) 100.7 F (38.2 C)  Weight: 153 lb (69.4 kg)  Height: 5\' 6"  (1.676  m)   Physical Exam Telehealth visit  Assessment and Plan: COVID-19 Assessment & Plan: At home positive Covid test. Paxlovid x 5 days, benzonatate, azelastine-fluticasone 137-50 mcg nasal spray, xyzal 5 mg, Advise to continue using albuterol for SOB. Discussed symptomatic treatment, rest, increase oral fluid intake. Take OTC tylenol for fever, Follow-up for worsening or persistent symptoms.Patient verbalizes understanding regarding plan of care and all questions answered   Orders: -     nirmatrelvir/ritonavir; Take 3 tablets by mouth 2 (two) times daily for 5 days. (Take nirmatrelvir 150 mg two tablets twice daily for 5 days and ritonavir 100 mg one tablet twice daily for 5 days) Patient GFR is 82  Dispense: 30 tablet; Refill: 0 -     Benzonatate; Take 1 capsule (100 mg total) by mouth 2 (two) times daily as needed for cough.  Dispense: 20 capsule; Refill: 0 -     Azelastine-Fluticasone; Place 1 spray into the nose every 12 (twelve) hours.  Dispense: 23 g; Refill: 1 -     Levocetirizine Dihydrochloride; Take 1 tablet (5 mg total) by mouth every evening.  Dispense: 15 tablet; Refill: 1    Follow Up Instructions: No follow-ups on file.   I discussed the assessment and treatment plan with the patient. The patient was provided an opportunity to ask questions, and all were answered. The patient agreed with the plan and demonstrated an understanding of the instructions.   The patient was advised to call back or seek an in-person evaluation if the symptoms worsen or if the condition fails to improve as anticipated.  The above assessment and management plan was discussed with the patient. The patient verbalized understanding of and has agreed to the management plan.   Renard Hamper Ria Comment, FNP

## 2022-05-03 ENCOUNTER — Other Ambulatory Visit: Payer: Self-pay | Admitting: Family Medicine

## 2022-05-03 DIAGNOSIS — U071 COVID-19: Secondary | ICD-10-CM

## 2022-05-06 ENCOUNTER — Ambulatory Visit (INDEPENDENT_AMBULATORY_CARE_PROVIDER_SITE_OTHER): Payer: Medicare Other

## 2022-05-06 DIAGNOSIS — J309 Allergic rhinitis, unspecified: Secondary | ICD-10-CM | POA: Diagnosis not present

## 2022-05-09 ENCOUNTER — Other Ambulatory Visit: Payer: Self-pay

## 2022-05-09 ENCOUNTER — Encounter: Payer: Self-pay | Admitting: Internal Medicine

## 2022-05-09 ENCOUNTER — Other Ambulatory Visit: Payer: Self-pay | Admitting: Family Medicine

## 2022-05-09 ENCOUNTER — Encounter: Payer: Self-pay | Admitting: Nutrition

## 2022-05-09 ENCOUNTER — Encounter: Payer: Medicare Other | Attending: Family Medicine | Admitting: Nutrition

## 2022-05-09 VITALS — Ht 66.0 in | Wt 161.0 lb

## 2022-05-09 DIAGNOSIS — R7303 Prediabetes: Secondary | ICD-10-CM

## 2022-05-09 DIAGNOSIS — M7989 Other specified soft tissue disorders: Secondary | ICD-10-CM

## 2022-05-09 DIAGNOSIS — I10 Essential (primary) hypertension: Secondary | ICD-10-CM

## 2022-05-09 NOTE — Progress Notes (Signed)
Medical Nutrition Therapy  Appointment Start time:  1300  Appointment End time:  1400  Primary concerns today: Pre Dm, HTN, Hyperlipidemia, Referral diagnosis: R73.03, i10.0, E78 Preferred learning style: No preference  Learning readiness: Ready    NUTRITION ASSESSMENT  67  yr old bfemale here for pre DM, HTN and Hyperlipidemia. PCP Dr. [name]. Wants to get her numbers under control and not develop DM or any other CVD problems. Currently is active with walking and willing to get back to the gym.  She notes she had some swelling in her legs yesterday and her BP has been elevated. Ate pizza and processed snacks over the weekend. Swelling in legs and ankles are better today.  She is willing to work on lifestyle medicine to reverse her medical issues.   Anthropometrics  Wt Readings from Last 3 Encounters:  05/09/22 161 lb (73 kg)  04/26/22 153 lb (69.4 kg)  03/22/22 162 lb (73.5 kg)   Ht Readings from Last 3 Encounters:  05/09/22 5\' 6"  (1.676 m)  04/26/22 5\' 6"  (1.676 m)  03/22/22 5\' 6"  (1.676 m)   Body mass index is 25.99 kg/m. @BMIFA @ Facility age limit for growth %iles is 20 years. Facility age limit for growth %iles is 20 years.    Clinical Medical Hx: See Chart Medications: See chart Labs:  Lab Results  Component Value Date   HGBA1C 6.2 (H) 03/08/2022      Latest Ref Rng & Units 03/08/2022    1:26 PM 08/24/2021   10:09 AM 02/12/2021    9:12 AM  CMP  Glucose 70 - 99 mg/dL 95  96  588   BUN 8 - 27 mg/dL 11  13  13    Creatinine 0.57 - 1.00 mg/dL 3.25  4.98  2.64   Sodium 134 - 144 mmol/L 142  143  139   Potassium 3.5 - 5.2 mmol/L 4.2  4.5  4.6   Chloride 96 - 106 mmol/L 104  105  104   CO2 20 - 29 mmol/L 23  24  24    Calcium 8.7 - 10.3 mg/dL 9.5  9.4  9.5   Total Protein 6.0 - 8.5 g/dL 7.3  7.2  6.8   Total Bilirubin 0.0 - 1.2 mg/dL 0.3  <1.5  0.3   Alkaline Phos 44 - 121 IU/L 116  120  109   AST 0 - 40 IU/L 23  24  28    ALT 0 - 32 IU/L 28  27  33    Lipid  Panel     Component Value Date/Time   CHOL 208 (H) 03/08/2022 1326   TRIG 54 03/08/2022 1326   HDL 57 03/08/2022 1326   CHOLHDL 3.6 03/08/2022 1326   CHOLHDL 3.6 08/30/2019 0856   VLDL 10 06/22/2016 0831   LDLCALC 141 (H) 03/08/2022 1326   LDLCALC 141 (H) 08/30/2019 0856   LABVLDL 10 03/08/2022 1326    Notable Signs/Symptoms: swelling in her feet yesterday  Lifestyle & Dietary Hx Married, Cooks at home but does eat out some.  Estimated daily fluid intake: 45-60 oz Supplements: MVI Sleep: good Stress / self-care: none Current average weekly physical activity: walks and goes to gym some  24-Hr Dietary Recall First Meal: old fashion, Oatmeal with raisins Snack:  Second Meal: 1 slice pizza, water or fruit Snack: fruit Third Meal: baked chicken, roll, green beans, water Snack: occassionally nuts,  gold fish Beverages: water  Estimated Energy Needs Calories: 1500 Carbohydrate: 170g Protein: 112g Fat: 42g  NUTRITION DIAGNOSIS  NI-5.6.2 Excessive fat intake  and glucose intake As related to high sugar high salt high fat diet As evidenced by LDL  A1C 6.2%, LDL 140, TChol 208 mg/dl..   NUTRITION INTERVENTION  Nutrition education (E-1) on the following topics:  Nutrition and Pre Diabetes education provided on My Plate, CHO counting, meal planning, portion sizes, timing of meals, avoiding snacks between meals unless having a low blood sugar, target ranges for A1C and blood sugars, signs/symptoms and treatment of hyper/hypoglycemia, monitoring blood sugars, taking medications as prescribed, benefits of exercising 30 minutes per day and prevention of complications of DM. Lifestyle Medicine  - Whole Food, Plant Predominant Nutrition is highly recommended: Eat Plenty of vegetables, Mushrooms, fruits, Legumes, Whole Grains, Nuts, seeds in lieu of processed meats, processed snacks/pastries red meat, poultry, eggs.    -It is better to avoid simple carbohydrates including: Cakes,  Sweet Desserts, Ice Cream, Soda (diet and regular), Sweet Tea, Candies, Chips, Cookies, Store Bought Juices, Alcohol in Excess of  1-2 drinks a day, Lemonade,  Artificial Sweeteners, Doughnuts, Coffee Creamers, "Sugar-free" Products, etc, etc.  This is not a complete list.....  Exercise: If you are able: 30 -60 minutes a day ,4 days a week, or 150 minutes a week.  The longer the better.  Combine stretch, strength, and aerobic activities.  If you were told in the past that you have high risk for cardiovascular diseases, you may seek evaluation by your heart doctor prior to initiating moderate to intense exercise programs.   Handouts Provided Include  Lifestyle Medicine Pre Diabetes  Learning Style & Readiness for Change Teaching method utilized: Visual & Auditory  Demonstrated degree of understanding via: Teach Back  Barriers to learning/adherence to lifestyle change: none  Goals Established by Pt Goals  Focus on more whole grains, plant based foods Reduce processed foods Stop eating out. Meal Prep Do [email or web address] program Make more salads at home with O/L dressings. Get LDL less than 100 and TCHOl less than 170 Get in 25 grams of fiber per day Exercise 3 times per week and walk 2 miles.   MONITORING & EVALUATION Dietary intake, weekly physical activity, and weight in 3 months.  Next Steps  Patient is to work on meal planning and eating more whole plant based foods. Marland Kitchen

## 2022-05-09 NOTE — Patient Instructions (Signed)
Goals  Focus on more whole grains, plant based foods Reduce processed foods Stop eating out. Meal Prep Do [email or web address] program Make more salads at home with O/L dressings. Get LDL less than 100 and TCHOl less than 170 Get in 25 grams of fiber per day Exercise 3 times per week and walk 2 miles.

## 2022-05-11 LAB — BMP8+EGFR
BUN/Creatinine Ratio: 13 (ref 12–28)
BUN: 11 mg/dL (ref 8–27)
CO2: 21 mmol/L (ref 20–29)
Calcium: 9.5 mg/dL (ref 8.7–10.3)
Chloride: 104 mmol/L (ref 96–106)
Creatinine, Ser: 0.86 mg/dL (ref 0.57–1.00)
Glucose: 90 mg/dL (ref 70–99)
Potassium: 4.1 mmol/L (ref 3.5–5.2)
Sodium: 142 mmol/L (ref 134–144)
eGFR: 75 mL/min/{1.73_m2} (ref >59)

## 2022-05-13 ENCOUNTER — Ambulatory Visit (INDEPENDENT_AMBULATORY_CARE_PROVIDER_SITE_OTHER): Payer: Medicare Other

## 2022-05-13 DIAGNOSIS — J309 Allergic rhinitis, unspecified: Secondary | ICD-10-CM | POA: Diagnosis not present

## 2022-05-17 ENCOUNTER — Encounter: Payer: Self-pay | Admitting: Family Medicine

## 2022-05-17 ENCOUNTER — Ambulatory Visit (INDEPENDENT_AMBULATORY_CARE_PROVIDER_SITE_OTHER): Payer: Medicare Other | Admitting: Family Medicine

## 2022-05-17 VITALS — BP 128/74 | HR 92 | Resp 16 | Ht 66.0 in | Wt 162.0 lb

## 2022-05-17 DIAGNOSIS — I10 Essential (primary) hypertension: Secondary | ICD-10-CM

## 2022-05-17 DIAGNOSIS — R600 Localized edema: Secondary | ICD-10-CM | POA: Diagnosis not present

## 2022-05-17 DIAGNOSIS — Z9109 Other allergy status, other than to drugs and biological substances: Secondary | ICD-10-CM | POA: Diagnosis not present

## 2022-05-17 DIAGNOSIS — J4540 Moderate persistent asthma, uncomplicated: Secondary | ICD-10-CM | POA: Diagnosis not present

## 2022-05-17 DIAGNOSIS — E663 Overweight: Secondary | ICD-10-CM

## 2022-05-17 NOTE — Progress Notes (Unsigned)
Denise Werner     MRN: [id number]      DOB: September 26, 1955   HPI Denise Werner is here for follow up and re-evaluation of chronic medical conditions, medication management and review of any available recent lab and radiology data.  Preventive health is updated, specifically  Cancer screening and Immunization.   Very happy regarding Nutrition consult and is committed to change in diet, already on a regular and adequate exercise program Requests Cardiology eval, bilateral mild ankle swelling noted intermittently in past 3 weks, deneis PND, orthopnea , poor exercise tolerance or chest pain, eval, recently had normal kidney function labs, notes less swelling wit reduced salt intake and goes down overnight, stands for long periods on a Thursday and Friday Some anxiety regarding spouse's health as he battles bladder cancer arding consultations or procedures which the PT has had in the interim are  addressed. The PT denies any adverse reactions to current medications since the last visit.  Covid positive approx 3 weeks ago, felt weak for approx 5 days, was treated with pxlovid ROS Denies recent fever or chills. Denies sinus pressure, nasal congestion, ear pain or sore throat. Denies chest congestion, productive cough or wheezing.  Denies abdominal pain, nausea, vomiting,diarrhea or constipation.   Denies dysuria, frequency, hesitancy or incontinence. Denies joint pain, swelling and limitation in mobility. Denies headaches, seizures, numbness, or tingling. Denies depression,  uncontrolled anxiety or insomnia. Denies skin break down or rash.   PE  BP 128/74   Pulse 92   Resp 16   Ht 5\' 6"  (1.676 m)   Wt 162 lb (73.5 kg)   SpO2 97%   BMI 26.15 kg/m   Patient alert and oriented and in no cardiopulmonary distress.  HEENT: No facial asymmetry, EOMI,     Neck supple .  Chest: Clear to auscultation bilaterally.  CVS: S1, S2 no murmurs, no S3.Regular rate.  ABD: Soft non tender.   Ext:  No edema  MS: Adequate ROM spine, shoulders, hips and knees.  Skin: Intact, no ulcerations or rash noted.  Psych: Good eye contact, normal affect. Memory intact not anxious or depressed appearing.  CNS: CN 2-12 intact, power,  normal throughout.no focal deficits noted.   Assessment & Plan  HTN (hypertension) Controlled, no change in medication DASH diet and commitment to daily physical activity for a minimum of 30 minutes discussed and encouraged, as a part of hypertension management. The importance of attaining a healthy weight is also discussed.     05/17/2022    9:42 AM 05/09/2022    1:24 PM 04/26/2022    9:27 AM 03/22/2022    1:18 PM 03/22/2022    1:17 PM 01/19/2022    8:39 AM 11/17/2021    1:05 PM  BP/Weight  Systolic BP 128   150 179 122 841  Diastolic BP 74   90 69 70 76  Wt. (Lbs) 162 161 153  162 161.8 159  BMI 26.15 kg/m2 25.99 kg/m2 24.69 kg/m2  26.15 kg/m2 26.12 kg/m2 25.66 kg/m2       Moderate persistent asthma, uncomplicated Well controlled on current regime, managed by Allergist  Environmental allergies Tolerating immunotherapy and it is beneficial  Overweight (BMI 25.0-29.9)  Patient re-educated about  the importance of commitment to a  minimum of 150 minutes of exercise per week as able.  The importance of healthy food choices with portion control discussed, as well as eating regularly and within a 12 hour window most days. The need to choose "clean ,  green" food 50 to 75% of the time is discussed, as well as to make water the primary drink and set a goal of 64 ounces water daily.       05/17/2022    9:42 AM 05/09/2022    1:24 PM 04/26/2022    9:27 AM  Weight /BMI  Weight 162 lb 161 lb 153 lb  Height 5\' 6"  (1.676 m) 5\' 6"  (1.676 m) 5\' 6"  (1.676 m)  BMI 26.15 kg/m2 25.99 kg/m2 24.69 kg/m2

## 2022-05-17 NOTE — Patient Instructions (Signed)
Keep appointment as befroe  Please get fasting labs ordered 3 to 5 days before your next scheduled visit with me  Nurse please provide samples of TRelegy   It is important that you exercise regularly at least 30 minutes 5 times a week. If you develop chest pain, have severe difficulty breathing, or feel very tired, stop exercising immediately and seek medical attention    Think about what you will eat, plan ahead. Choose " clean, green, fresh or frozen" over canned, processed or packaged foods which are more sugary, salty and fatty. 70 to 75% of food eaten should be vegetables and fruit. Three meals at set times with snacks allowed between meals, but they must be fruit or vegetables. Aim to eat over a 12 hour period , example 7 am to 7 pm, and STOP after  your last meal of the day. Drink water,generally about 64 ounces per day, no other drink is as healthy. Fruit juice is best enjoyed in a healthy way, by EATING the fruit. Thanks for choosing [facility], we consider it a privelige to serve you.

## 2022-05-18 ENCOUNTER — Ambulatory Visit: Payer: Medicare Other | Admitting: Family Medicine

## 2022-05-18 ENCOUNTER — Encounter: Payer: Self-pay | Admitting: Family Medicine

## 2022-05-18 NOTE — Assessment & Plan Note (Signed)
Well controlled on current regime, managed by Allergist

## 2022-05-18 NOTE — Assessment & Plan Note (Signed)
Tolerating immunotherapy and it is beneficial

## 2022-05-18 NOTE — Assessment & Plan Note (Signed)
Controlled, no change in medication DASH diet and commitment to daily physical activity for a minimum of 30 minutes discussed and encouraged, as a part of hypertension management. The importance of attaining a healthy weight is also discussed.     05/17/2022    9:42 AM 05/09/2022    1:24 PM 04/26/2022    9:27 AM 03/22/2022    1:18 PM 03/22/2022    1:17 PM 01/19/2022    8:39 AM 11/17/2021    1:05 PM  BP/Weight  Systolic BP 128   150 179 122 696  Diastolic BP 74   90 69 70 76  Wt. (Lbs) 162 161 153  162 161.8 159  BMI 26.15 kg/m2 25.99 kg/m2 24.69 kg/m2  26.15 kg/m2 26.12 kg/m2 25.66 kg/m2

## 2022-05-18 NOTE — Assessment & Plan Note (Signed)
  Patient re-educated about  the importance of commitment to a  minimum of 150 minutes of exercise per week as able.  The importance of healthy food choices with portion control discussed, as well as eating regularly and within a 12 hour window most days. The need to choose "clean , green" food 50 to 75% of the time is discussed, as well as to make water the primary drink and set a goal of 64 ounces water daily.       05/17/2022    9:42 AM 05/09/2022    1:24 PM 04/26/2022    9:27 AM  Weight /BMI  Weight 162 lb 161 lb 153 lb  Height  (1.676 m)  (1.676 m)  (1.676 m)  BMI 26.15 kg/m2 25.99 kg/m2 24.69 kg/m2

## 2022-05-20 ENCOUNTER — Ambulatory Visit (INDEPENDENT_AMBULATORY_CARE_PROVIDER_SITE_OTHER): Payer: Medicare Other

## 2022-05-20 DIAGNOSIS — J309 Allergic rhinitis, unspecified: Secondary | ICD-10-CM | POA: Diagnosis not present

## 2022-05-24 ENCOUNTER — Other Ambulatory Visit (HOSPITAL_COMMUNITY): Payer: Self-pay | Admitting: Sports Medicine

## 2022-05-24 DIAGNOSIS — M5416 Radiculopathy, lumbar region: Secondary | ICD-10-CM | POA: Diagnosis not present

## 2022-05-24 DIAGNOSIS — M4310 Spondylolisthesis, site unspecified: Secondary | ICD-10-CM

## 2022-05-24 DIAGNOSIS — M25552 Pain in left hip: Secondary | ICD-10-CM | POA: Diagnosis not present

## 2022-05-27 ENCOUNTER — Ambulatory Visit (INDEPENDENT_AMBULATORY_CARE_PROVIDER_SITE_OTHER): Payer: Medicare Other

## 2022-05-27 DIAGNOSIS — J309 Allergic rhinitis, unspecified: Secondary | ICD-10-CM

## 2022-06-03 ENCOUNTER — Ambulatory Visit (INDEPENDENT_AMBULATORY_CARE_PROVIDER_SITE_OTHER): Payer: Medicare Other

## 2022-06-03 DIAGNOSIS — J309 Allergic rhinitis, unspecified: Secondary | ICD-10-CM

## 2022-06-04 ENCOUNTER — Ambulatory Visit (HOSPITAL_COMMUNITY): Payer: Medicare Other

## 2022-06-04 ENCOUNTER — Encounter (HOSPITAL_COMMUNITY): Payer: Self-pay

## 2022-06-04 DIAGNOSIS — M545 Low back pain, unspecified: Secondary | ICD-10-CM | POA: Diagnosis not present

## 2022-06-15 ENCOUNTER — Ambulatory Visit (HOSPITAL_COMMUNITY): Payer: Medicare Other

## 2022-06-29 DIAGNOSIS — M5416 Radiculopathy, lumbar region: Secondary | ICD-10-CM | POA: Diagnosis not present

## 2022-07-11 DIAGNOSIS — M5416 Radiculopathy, lumbar region: Secondary | ICD-10-CM | POA: Diagnosis not present

## 2022-07-29 ENCOUNTER — Ambulatory Visit (INDEPENDENT_AMBULATORY_CARE_PROVIDER_SITE_OTHER): Payer: Medicare Other

## 2022-07-29 DIAGNOSIS — J309 Allergic rhinitis, unspecified: Secondary | ICD-10-CM

## 2022-08-02 DIAGNOSIS — M5416 Radiculopathy, lumbar region: Secondary | ICD-10-CM | POA: Diagnosis not present

## 2022-08-12 ENCOUNTER — Ambulatory Visit (INDEPENDENT_AMBULATORY_CARE_PROVIDER_SITE_OTHER): Payer: Medicare Other

## 2022-08-12 DIAGNOSIS — J309 Allergic rhinitis, unspecified: Secondary | ICD-10-CM

## 2022-08-15 DIAGNOSIS — M19072 Primary osteoarthritis, left ankle and foot: Secondary | ICD-10-CM | POA: Diagnosis not present

## 2022-08-15 DIAGNOSIS — M79672 Pain in left foot: Secondary | ICD-10-CM | POA: Diagnosis not present

## 2022-08-15 DIAGNOSIS — M5416 Radiculopathy, lumbar region: Secondary | ICD-10-CM | POA: Diagnosis not present

## 2022-08-17 ENCOUNTER — Ambulatory Visit: Payer: Medicare Other | Attending: Internal Medicine | Admitting: Student

## 2022-08-17 ENCOUNTER — Encounter: Payer: Self-pay | Admitting: Student

## 2022-08-17 VITALS — BP 122/78 | HR 88 | Ht 66.0 in | Wt 156.0 lb

## 2022-08-17 DIAGNOSIS — E785 Hyperlipidemia, unspecified: Secondary | ICD-10-CM | POA: Diagnosis not present

## 2022-08-17 DIAGNOSIS — Z87898 Personal history of other specified conditions: Secondary | ICD-10-CM

## 2022-08-17 DIAGNOSIS — R600 Localized edema: Secondary | ICD-10-CM

## 2022-08-17 DIAGNOSIS — I10 Essential (primary) hypertension: Secondary | ICD-10-CM

## 2022-08-17 DIAGNOSIS — M5416 Radiculopathy, lumbar region: Secondary | ICD-10-CM | POA: Diagnosis not present

## 2022-08-17 NOTE — Patient Instructions (Signed)
Medication Instructions:  Your physician recommends that you continue on your current medications as directed. Please refer to the Current Medication list given to you today.  *If you need a refill on your cardiac medications before your next appointment, please call your pharmacy*   Lab Work: None If you have labs (blood work) drawn today and your tests are completely normal, you will receive your results only by: MyChart Message (if you have MyChart) OR A paper copy in the mail If you have any lab test that is abnormal or we need to change your treatment, we will call you to review the results.   Testing/Procedures: None   Follow-Up: At [facility], you and your health needs are our priority.  As part of our continuing mission to provide you with exceptional heart care, we have created designated Provider Care Teams.  These Care Teams include your primary Cardiologist (physician) and Advanced Practice Providers (APPs -  Physician Assistants and Nurse Practitioners) who all work together to provide you with the care you need, when you need it.  We recommend signing up for the patient portal called "MyChart".  Sign up information is provided on this After Visit Summary.  MyChart is used to connect with patients for Virtual Visits (Telemedicine).  Patients are able to view lab/test results, encounter notes, upcoming appointments, etc.  Non-urgent messages can be sent to your provider as well.   To learn more about what you can do with MyChart, go to [email or web address].    Your next appointment:   1 year(s)  Provider:   You may see Dietrich Pates, MD or one of the following Advanced Practice Providers on your designated Care Team:   Randall An, PA-C  Jacolyn Reedy, [place]     Other Instructions

## 2022-08-17 NOTE — Progress Notes (Signed)
Cardiology Office Note    Date:  08/17/2022  ID:  Shadonna, Benedick December 23, 1955, MRN [id number] Cardiologist: Dietrich Pates, MD    History of Present Illness:    TURQUOISE ESCH is a 67 y.o. female with past medical history of HTN, HLD, asthma and atypical chest pain (prior coronary CT in 03/2020 showing a coronary calcium score of 0) who presents to the office today for annual follow-up.  She was last examined by myself in 07/2021 and reported occasional episodes of shooting chest pain which would last for a few seconds and spontaneously resolve. She was walking on the treadmill at the [facility] several days a week and denied any anginal symptoms. Given her overall atypical symptoms and recent reassuring Coronary CT, further cardiac testing was not pursued. She did report some lower extremity edema which was isolated to her ankles and she was encouraged to limit her sodium intake and utilize compression stockings.  In talking with the patient today, she reports overall doing well since her last office visit. She typically performs home workouts instead of going to the [facility] and denies any recent exertional chest pain or dyspnea on exertion. Reports her prior episodes of shooting chest pain resolved. No recent palpitations, orthopnea or PND. Does have some intermittent dyspnea at rest and uses her inhaler with improvement in symptoms. She does experience occasional swelling which is isolated to her ankles but no pitting edema further up her legs.   Studies Reviewed:   EKG: EKG is ordered today and demonstrates:   EKG Interpretation Date/Time:  Wednesday August 17 2022 15:06:13 EDT Ventricular Rate:  86 PR Interval:  154 QRS Duration:  76 QT Interval:  346 QTC Calculation: 414 R Axis:   62  Text Interpretation: Normal sinus rhythm ST & T wave abnormality along inferior leads, similar to prior tracings, Confirmed by Randall An ([id number]) on 08/17/2022 3:13:00 PM       Echocardiogram:  11/2019 IMPRESSIONS     1. Left ventricular ejection fraction, by estimation, is 65 to 70%. The  left ventricle has normal function. The left ventricle has no regional  wall motion abnormalities. Left ventricular diastolic parameters were  normal.   2. Right ventricular systolic function is normal. The right ventricular  size is normal. Tricuspid regurgitation signal is inadequate for assessing  PA pressure.   3. The mitral valve is grossly normal. Trivial mitral valve  regurgitation.   4. The aortic valve is tricuspid. Aortic valve regurgitation is not  visualized.   5. The inferior vena cava is normal in size with greater than 50%  respiratory variability, suggesting right atrial pressure of 3 mmHg.   Coronary CT: 03/2020 IMPRESSION: 1. Coronary calcium score of 0.   2. Normal coronary origin with right dominance.   3. Mild cardiac motion artifact, but the coronary arteries appear normal.   RECOMMENDATIONS: 1. No evidence of CAD (0%). Consider non-atherosclerotic causes of chest pain.   Physical Exam:   VS:  BP 122/78   Pulse 88   Ht 5\' 6"  (1.676 m)   Wt 156 lb (70.8 kg)   SpO2 95%   BMI 25.18 kg/m    Wt Readings from Last 3 Encounters:  08/17/22 156 lb (70.8 kg)  05/17/22 162 lb (73.5 kg)  05/09/22 161 lb (73 kg)     GEN: Pleasant female appearing in no acute distress NECK: No JVD; No carotid bruits CARDIAC: RRR, no murmurs, rubs, gallops RESPIRATORY:  Clear to auscultation without rales, wheezing  or rhonchi  ABDOMEN: Appears non-distended. No obvious abdominal masses. EXTREMITIES: No clubbing or cyanosis. No pitting edema.  Distal pedal pulses are 2+ bilaterally.   Assessment and Plan:   1. Lower Extremity Edema - Her lower extremity edema has been isolated to her ankles and does not extend further up her legs. She has no edema on examination today. Given her prior echocardiogram in 2021 showing a preserved EF with no acute abnormalities, would not  anticipate further testing at this time. I encouraged her to make [place] aware if she develops worsening edema as this could be arranged in the future if indicated. Not currently requiring diuretic therapy.   2. History of Atypical Chest Pain - She did have a coronary calcium score of 0 by prior Coronary CTA in 03/2020. She denies any recurrent pain and EKG today is without acute ST changes when compared to prior tracings. Continue with risk factor modification. She was recently restarted on statin therapy as discussed below.  3. HTN - BP is well-controlled at 122/78 during today's visit. Continue current medical therapy with amlodipine 5 mg daily.  4. HLD - Followed by her PCP. LDL was elevated at 141 in 03/2022 she was not taking Crestor at that time. She has since been reinitiated on Crestor 10 mg daily and is scheduled for follow-up labs later this month  Signed, Ellsworth Lennox, PA-C

## 2022-08-19 ENCOUNTER — Other Ambulatory Visit: Payer: Self-pay | Admitting: Family Medicine

## 2022-08-22 ENCOUNTER — Ambulatory Visit: Payer: Medicare Other | Admitting: Internal Medicine

## 2022-08-23 DIAGNOSIS — M5416 Radiculopathy, lumbar region: Secondary | ICD-10-CM | POA: Diagnosis not present

## 2022-08-30 ENCOUNTER — Ambulatory Visit: Payer: Medicare Other | Admitting: Family Medicine

## 2022-08-30 DIAGNOSIS — M5416 Radiculopathy, lumbar region: Secondary | ICD-10-CM | POA: Diagnosis not present

## 2022-08-31 ENCOUNTER — Ambulatory Visit (INDEPENDENT_AMBULATORY_CARE_PROVIDER_SITE_OTHER): Payer: Medicare Other

## 2022-08-31 DIAGNOSIS — J309 Allergic rhinitis, unspecified: Secondary | ICD-10-CM

## 2022-09-01 DIAGNOSIS — J3089 Other allergic rhinitis: Secondary | ICD-10-CM

## 2022-09-01 NOTE — Progress Notes (Signed)
VIALS EXP 09-01-23

## 2022-09-02 DIAGNOSIS — J3081 Allergic rhinitis due to animal (cat) (dog) hair and dander: Secondary | ICD-10-CM

## 2022-09-07 ENCOUNTER — Ambulatory Visit: Payer: Medicare Other | Admitting: Nutrition

## 2022-09-09 ENCOUNTER — Ambulatory Visit (INDEPENDENT_AMBULATORY_CARE_PROVIDER_SITE_OTHER): Payer: Medicare Other

## 2022-09-09 DIAGNOSIS — J309 Allergic rhinitis, unspecified: Secondary | ICD-10-CM | POA: Diagnosis not present

## 2022-09-21 ENCOUNTER — Ambulatory Visit (INDEPENDENT_AMBULATORY_CARE_PROVIDER_SITE_OTHER): Payer: Medicare Other

## 2022-09-21 DIAGNOSIS — J309 Allergic rhinitis, unspecified: Secondary | ICD-10-CM | POA: Diagnosis not present

## 2022-09-26 DIAGNOSIS — R7303 Prediabetes: Secondary | ICD-10-CM | POA: Diagnosis not present

## 2022-09-27 LAB — CMP14+EGFR
BUN/Creatinine Ratio: 16 (ref 12–28)
Creatinine, Ser: 0.81 mg/dL (ref 0.57–1.00)
Glucose: 100 mg/dL — ABNORMAL HIGH (ref 70–99)
Total Protein: 6.6 g/dL (ref 6.0–8.5)

## 2022-09-28 ENCOUNTER — Encounter: Payer: Self-pay | Admitting: Family Medicine

## 2022-09-28 ENCOUNTER — Ambulatory Visit (INDEPENDENT_AMBULATORY_CARE_PROVIDER_SITE_OTHER): Payer: Medicare Other | Admitting: Family Medicine

## 2022-09-28 VITALS — BP 136/79 | HR 77 | Ht 66.0 in | Wt 157.0 lb

## 2022-09-28 DIAGNOSIS — E559 Vitamin D deficiency, unspecified: Secondary | ICD-10-CM

## 2022-09-28 DIAGNOSIS — Z9109 Other allergy status, other than to drugs and biological substances: Secondary | ICD-10-CM

## 2022-09-28 DIAGNOSIS — I10 Essential (primary) hypertension: Secondary | ICD-10-CM

## 2022-09-28 DIAGNOSIS — E785 Hyperlipidemia, unspecified: Secondary | ICD-10-CM | POA: Diagnosis not present

## 2022-09-28 DIAGNOSIS — R7303 Prediabetes: Secondary | ICD-10-CM

## 2022-09-28 DIAGNOSIS — I739 Peripheral vascular disease, unspecified: Secondary | ICD-10-CM

## 2022-09-28 DIAGNOSIS — N3010 Interstitial cystitis (chronic) without hematuria: Secondary | ICD-10-CM | POA: Diagnosis not present

## 2022-09-28 DIAGNOSIS — Z23 Encounter for immunization: Secondary | ICD-10-CM

## 2022-09-28 DIAGNOSIS — N3289 Other specified disorders of bladder: Secondary | ICD-10-CM

## 2022-09-28 DIAGNOSIS — M5410 Radiculopathy, site unspecified: Secondary | ICD-10-CM

## 2022-09-28 DIAGNOSIS — E663 Overweight: Secondary | ICD-10-CM

## 2022-09-28 MED ORDER — AMLODIPINE BESYLATE 5 MG PO TABS: 5.0000 mg | ORAL_TABLET | Freq: Every day | ORAL | 3 refills | Status: DC

## 2022-09-28 NOTE — Patient Instructions (Addendum)
Annual; exam February, call if you need me sooner  CbC,Fasting lipid , cmp and EgFR, HBA1C, TSH and vit D 3 to 7 days before   It is important that you exercise regularly at least 30 minutes 5 times a week. If you develop chest pain, have severe difficulty breathing, or feel very tired, stop exercising immediately and seek medical attention    Flu vaccine today  Covid vaccine when available  You are referred to Urology, ( [facility])  You are being referred for evaluation of circulation in feet   Thanks for choosing [facility], we consider it a privelige to serve you.

## 2022-09-29 ENCOUNTER — Encounter: Payer: Self-pay | Admitting: Family Medicine

## 2022-09-29 DIAGNOSIS — I739 Peripheral vascular disease, unspecified: Secondary | ICD-10-CM | POA: Insufficient documentation

## 2022-09-29 NOTE — Assessment & Plan Note (Signed)
Reports recent eval by ortho recommended surgical consult, however no interest currently

## 2022-09-29 NOTE — Assessment & Plan Note (Addendum)
Reports recent in home screen is abnormal, will refer to vascular for full assessment,

## 2022-09-29 NOTE — Assessment & Plan Note (Signed)
Slight improvement , will re commit to regular exercise Patient educated about the importance of limiting  Carbohydrate intake , the need to commit to daily physical activity for a minimum of 30 minutes , and to commit weight loss. The fact that changes in all these areas will reduce or eliminate all together the development of diabetes is stressed.      Latest Ref Rng & Units 09/26/2022   10:11 AM 05/09/2022    2:47 PM 03/08/2022    1:26 PM 08/24/2021   10:09 AM 02/12/2021    9:12 AM  Diabetic Labs  HbA1c 4.8 - 5.6 % 6.1   6.2  6.0  5.7   Chol 100 - 199 mg/dL 220   254  270  623   HDL >39 mg/dL 52   57  55  55   Calc LDL 0 - 99 mg/dL 81   762  831  73   Triglycerides 0 - 149 mg/dL 31   54  67  31   Creatinine 0.57 - 1.00 mg/dL 5.17  6.16  0.73  7.10  0.85       09/28/2022   10:42 AM 08/17/2022    2:57 PM 05/17/2022    9:42 AM 05/09/2022    1:24 PM 04/26/2022    9:27 AM 03/22/2022    1:18 PM 03/22/2022    1:17 PM  BP/Weight  Systolic BP 136 122 128   150 626  Diastolic BP 79 78 74   90 69  Wt. (Lbs) 157.04 156 162 161 153  162  BMI 25.35 kg/m2 25.18 kg/m2 26.15 kg/m2 25.99 kg/m2 24.69 kg/m2  26.15 kg/m2       No data to display

## 2022-09-29 NOTE — Assessment & Plan Note (Signed)
Requests re eval by urology, also c/o bladder spasm , denies incontinence, on no meds regularly, will refer to Urologist of choice

## 2022-09-29 NOTE — Assessment & Plan Note (Signed)
On immunotherapy every 3 weeks , fairly well controlled , but needs to commit to daily oral medication also

## 2022-09-29 NOTE — Assessment & Plan Note (Signed)
Controlled, no change in medication DASH diet and commitment to daily physical activity for a minimum of 30 minutes discussed and encouraged, as a part of hypertension management. The importance of attaining a healthy weight is also discussed.     09/28/2022   10:42 AM 08/17/2022    2:57 PM 05/17/2022    9:42 AM 05/09/2022    1:24 PM 04/26/2022    9:27 AM 03/22/2022    1:18 PM 03/22/2022    1:17 PM  BP/Weight  Systolic BP 136 122 128   150 846  Diastolic BP 79 78 74   90 69  Wt. (Lbs) 157.04 156 162 161 153  162  BMI 25.35 kg/m2 25.18 kg/m2 26.15 kg/m2 25.99 kg/m2 24.69 kg/m2  26.15 kg/m2

## 2022-09-29 NOTE — Progress Notes (Signed)
Denise Werner     MRN: [id number]      DOB: [birth year]  Chief Complaint  Patient presents with   Follow-up    Follow up    HPI Denise Werner is here for follow up and re-evaluation of chronic medical conditions, medication management and review of any available recent lab and radiology data.  Preventive health is updated, specifically  Cancer screening and Immunization.   Questions or concerns regarding consultations or procedures which the PT has had in the interim are  addressed.Has been advised by Ortho that she needs spine surgery, however not interested currently, can live with the pain, and denies weakness , or numbness of lower extremities  or new incontinence The PT denies any adverse reactions to current medications since the last visit.  C/o bladder spasms and  nocturia wants re eval by Urologist who saw her years ago. Denies flank pain, dysuria or hematuria  ROS Denies recent fever or chills. Denies sinus pressure,increased  nasal congestion, denies ear pain or sore throat. Denies chest congestion, productive cough or wheezing. Denies chest pains, palpitations and leg swelling Denies abdominal pain, nausea, vomiting,diarrhea or constipation.   Denies headaches, seizures, numbness,  Denies depression, anxiety or insomnia. Denies skin break down or rash.   PE  BP 136/79 (BP Location: Right Arm, Patient Position: Sitting, Cuff Size: Large)   Pulse 77   Ht 5\' 6"  (1.676 m)   Wt 157 lb 0.6 oz (71.2 kg)   SpO2 96%   BMI 25.35 kg/m   Patient alert and oriented and in no cardiopulmonary distress.  HEENT: No facial asymmetry, EOMI,     Neck supple .  Chest: Clear to auscultation bilaterally.  CVS: S1, S2 no murmurs, no S3.Regular rate.  ABD: Soft non tender.   Ext: No edema  MS: Adequate ROM spine, shoulders, hips and knees.  Skin: Intact, no ulcerations or rash noted.  Psych: Good eye contact, normal affect. Memory intact not anxious or depressed  appearing.  CNS: CN 2-12 intact, power,  normal throughout.no focal deficits noted.   Assessment & Plan  HTN (hypertension) Controlled, no change in medication DASH diet and commitment to daily physical activity for a minimum of 30 minutes discussed and encouraged, as a part of hypertension management. The importance of attaining a healthy weight is also discussed.     09/28/2022   10:42 AM 08/17/2022    2:57 PM 05/17/2022    9:42 AM 05/09/2022    1:24 PM 04/26/2022    9:27 AM 03/22/2022    1:18 PM 03/22/2022    1:17 PM  BP/Weight  Systolic BP 136 122 128   150 409  Diastolic BP 79 78 74   90 69  Wt. (Lbs) 157.04 156 162 161 153  162  BMI 25.35 kg/m2 25.18 kg/m2 26.15 kg/m2 25.99 kg/m2 24.69 kg/m2  26.15 kg/m2       Hyperlipidemia LDL goal <100 Controlled, no change in medication Hyperlipidemia:Low fat diet discussed and encouraged.   Lipid Panel  Lab Results  Component Value Date   CHOL 141 09/26/2022   HDL 52 09/26/2022   LDLCALC 81 09/26/2022   TRIG 31 09/26/2022   CHOLHDL 2.7 09/26/2022       Prediabetes Slight improvement , will re commit to regular exercise Patient educated about the importance of limiting  Carbohydrate intake , the need to commit to daily physical activity for a minimum of 30 minutes , and to commit weight loss. The fact that  changes in all these areas will reduce or eliminate all together the development of diabetes is stressed.      Latest Ref Rng & Units 09/26/2022   10:11 AM 05/09/2022    2:47 PM 03/08/2022    1:26 PM 08/24/2021   10:09 AM 02/12/2021    9:12 AM  Diabetic Labs  HbA1c 4.8 - 5.6 % 6.1   6.2  6.0  5.7   Chol 100 - 199 mg/dL 657   846  962  952   HDL >39 mg/dL 52   57  55  55   Calc LDL 0 - 99 mg/dL 81   841  324  73   Triglycerides 0 - 149 mg/dL 31   54  67  31   Creatinine 0.57 - 1.00 mg/dL 4.01  0.27  2.53  6.64  0.85       09/28/2022   10:42 AM 08/17/2022    2:57 PM 05/17/2022    9:42 AM 05/09/2022    1:24 PM 04/26/2022     9:27 AM 03/22/2022    1:18 PM 03/22/2022    1:17 PM  BP/Weight  Systolic BP 136 122 128   150 403  Diastolic BP 79 78 74   90 69  Wt. (Lbs) 157.04 156 162 161 153  162  BMI 25.35 kg/m2 25.18 kg/m2 26.15 kg/m2 25.99 kg/m2 24.69 kg/m2  26.15 kg/m2       No data to display            Interstitial cystitis Requests re eval by urology, also c/o bladder spasm , denies incontinence, on no meds regularly, will refer to Urologist of choice  Environmental allergies On immunotherapy every 3 weeks , fairly well controlled , but needs to commit to daily oral medication also  Back pain with right-sided radiculopathy Reports recent eval by ortho recommended surgical consult, however no interest currently  PAD (peripheral artery disease) (HCC) Reports recent in home screen is abnormal, will refer to vascular for full assessment,

## 2022-09-29 NOTE — Assessment & Plan Note (Signed)
Controlled, no change in medication Hyperlipidemia:Low fat diet discussed and encouraged.   Lipid Panel  Lab Results  Component Value Date   CHOL 141 09/26/2022   HDL 52 09/26/2022   LDLCALC 81 09/26/2022   TRIG 31 09/26/2022   CHOLHDL 2.7 09/26/2022

## 2022-10-13 ENCOUNTER — Encounter: Payer: Self-pay | Admitting: Family Medicine

## 2022-10-13 MED ORDER — AZITHROMYCIN 250 MG PO TABS: ORAL_TABLET | ORAL | 0 refills | Status: AC

## 2022-10-13 NOTE — Telephone Encounter (Signed)
Patient called in regard to previous tele message. No available appointments  Wants to see if provider can send in med for sinus infection , wants a call back in regard

## 2022-10-21 ENCOUNTER — Ambulatory Visit (INDEPENDENT_AMBULATORY_CARE_PROVIDER_SITE_OTHER): Payer: Medicare Other

## 2022-10-21 DIAGNOSIS — J309 Allergic rhinitis, unspecified: Secondary | ICD-10-CM

## 2022-11-09 ENCOUNTER — Encounter: Payer: Medicare Other | Admitting: Vascular Surgery

## 2022-11-09 ENCOUNTER — Encounter (HOSPITAL_COMMUNITY): Payer: Medicare Other

## 2022-11-14 ENCOUNTER — Other Ambulatory Visit (HOSPITAL_COMMUNITY): Payer: Self-pay | Admitting: Sports Medicine

## 2022-11-14 DIAGNOSIS — M5412 Radiculopathy, cervical region: Secondary | ICD-10-CM | POA: Diagnosis not present

## 2022-11-14 DIAGNOSIS — M47812 Spondylosis without myelopathy or radiculopathy, cervical region: Secondary | ICD-10-CM | POA: Diagnosis not present

## 2022-11-14 DIAGNOSIS — M25512 Pain in left shoulder: Secondary | ICD-10-CM | POA: Diagnosis not present

## 2022-11-18 ENCOUNTER — Ambulatory Visit (INDEPENDENT_AMBULATORY_CARE_PROVIDER_SITE_OTHER): Payer: Medicare Other

## 2022-11-18 DIAGNOSIS — J309 Allergic rhinitis, unspecified: Secondary | ICD-10-CM | POA: Diagnosis not present

## 2022-11-19 ENCOUNTER — Ambulatory Visit (HOSPITAL_COMMUNITY): Payer: Medicare Other

## 2022-11-19 ENCOUNTER — Encounter (HOSPITAL_COMMUNITY): Payer: Self-pay

## 2022-11-21 DIAGNOSIS — M19072 Primary osteoarthritis, left ankle and foot: Secondary | ICD-10-CM | POA: Diagnosis not present

## 2022-11-30 ENCOUNTER — Ambulatory Visit (INDEPENDENT_AMBULATORY_CARE_PROVIDER_SITE_OTHER): Payer: Medicare Other

## 2022-11-30 DIAGNOSIS — J309 Allergic rhinitis, unspecified: Secondary | ICD-10-CM

## 2022-11-30 MED ORDER — EPINEPHRINE 0.3 MG/0.3ML IJ SOAJ: 0.3000 mg | INTRAMUSCULAR | 1 refills | Status: DC | PRN

## 2022-12-06 ENCOUNTER — Other Ambulatory Visit: Payer: Self-pay | Admitting: *Deleted

## 2022-12-06 DIAGNOSIS — I739 Peripheral vascular disease, unspecified: Secondary | ICD-10-CM

## 2022-12-12 ENCOUNTER — Ambulatory Visit
Admission: RE | Admit: 2022-12-12 | Discharge: 2022-12-12 | Disposition: A | Discharge status: Home / Self Care | Payer: Medicare Other | Source: Ambulatory Visit | Attending: Family Medicine

## 2022-12-12 VITALS — BP 137/82 | HR 87 | Temp 98.4°F | Resp 18

## 2022-12-12 DIAGNOSIS — J4521 Mild intermittent asthma with (acute) exacerbation: Secondary | ICD-10-CM

## 2022-12-12 DIAGNOSIS — J0100 Acute maxillary sinusitis, unspecified: Secondary | ICD-10-CM | POA: Diagnosis not present

## 2022-12-12 MED ORDER — PREDNISONE 20 MG PO TABS: 40.0000 mg | ORAL_TABLET | Freq: Every day | ORAL | 0 refills | Status: DC

## 2022-12-12 MED ORDER — AMOXICILLIN-POT CLAVULANATE 875-125 MG PO TABS: 1.0000 | ORAL_TABLET | Freq: Two times a day (BID) | ORAL | 0 refills | Status: DC

## 2022-12-12 NOTE — ED Triage Notes (Signed)
Cough with green mucus, chest tightness, congestion x 1 week. Taking tylenol cold and flu.

## 2022-12-14 ENCOUNTER — Encounter: Payer: Medicare Other | Admitting: Vascular Surgery

## 2022-12-14 ENCOUNTER — Ambulatory Visit (HOSPITAL_COMMUNITY): Payer: Medicare Other

## 2022-12-16 NOTE — ED Provider Notes (Signed)
[facility]    CSN: [id number] Arrival date & time: 12/12/22  1745      History   Chief Complaint Chief Complaint  Patient presents with   Cough    I think that I have  a bad sinus infection. i am coughing up green and yellow mucus. I do have a little discomfort in my check and blowing my nose a lot. - Entered by patient   Nasal Congestion    HPI Denise Werner is a 67 y.o. female.   Presenting today with about a week of worsening productive cough, chest tightness, nasal congestion, facial pain and pressure.  Denies chest pain, shortness of breath, abdominal pain, nausea vomiting or diarrhea.  Taking Tylenol Cold and flu with minimal relief.  History of asthma on Xopenex, Trelegy.    Past Medical History:  Diagnosis Date   Allergy    Arthritis    Asthma    Bladder pain    Chronic neck pain    Dyslipidemia    Frequency of urination    GERD (gastroesophageal reflux disease) OCCASIONAL   H/O hiatal hernia    Hyperlipidemia    Phreesia 01/04/2020   Hypertension    Phreesia 01/04/2020   Interstitial cystitis    Nocturia    Osteoporosis    Short of breath on exertion    Sleep apnea    Urgency of urination     Patient Active Problem List   Diagnosis Date Noted   PAD (peripheral artery disease) (HCC) 09/29/2022   COVID-19 04/26/2022   Encounter for Medicare annual examination with abnormal findings 03/27/2022   Welcome to Medicare preventive visit 11/21/2021   Urinary frequency 08/25/2021   Moderate persistent asthma, uncomplicated 06/26/2020   Overweight (BMI 25.0-29.9) 09/03/2019   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   Thoracic spine pain 12/03/2015   Back pain with right-sided radiculopathy 07/08/2013   Gastroesophageal reflux disease 12/18/2012   HTN (hypertension) 09/13/2011   Interstitial cystitis 09/26/2010   Osteopenia 09/20/2010   Ganglion 03/17/2010   Prediabetes 10/08/2009   Environmental allergies 01/11/2008   Cough variant asthma  01/11/2008   Hyperlipidemia LDL goal <100 08/16/2007    Past Surgical History:  Procedure Laterality Date   ABDOMINAL HYSTERECTOMY     W/ BILATERAL SALPINGOOPHECTOMY   APPENDECTOMY     BIOPSY  04/11/2018   Procedure: BIOPSY;  Surgeon: Malissa Hippo, MD;  Location: [facility];  Service: Endoscopy;;  colon polyp with biopsy forceps   BUNIONECTOMY  04/14/2003   LEFT FOOT  W/ POST REMOVAL INTERNAL FIXATION DEVICE  VIA SILVER BUNIONECTOMY   CHOLECYSTECTOMY  02/01/1996   COLONOSCOPY N/A 04/11/2018   Procedure: COLONOSCOPY;  Surgeon: Malissa Hippo, MD;  Location: [facility];  Service: Endoscopy;  Laterality: N/A;  730   CYSTO WITH HYDRODISTENSION  09/12/2011   Procedure: CYSTOSCOPY/HYDRODISTENSION;  Surgeon: Kathi Ludwig, MD;  Location: [facility];  Service: Urology;  Laterality: N/A;  INSTILL M & P AND M & P   CYSTO/ HOD/ INSTILLATION THERAPY  11-17-2008;   12-02-1999   INTERSTITIAL CYSTITIS   ESOPHAGOGASTRODUODENOSCOPY N/A 05/01/2013   Procedure: ESOPHAGOGASTRODUODENOSCOPY (EGD);  Surgeon: Malissa Hippo, MD;  Location: [facility];  Service: Endoscopy;  Laterality: N/A;  100   FINGER SURGERY  10/13/2009   left 5th finger ARTHRODESIS   FOOT ARTHROPLASTY  05/03/2004   FIFTHE TOE RIGHT FOOT   KNEE ARTHROSCOPY  02/01/2011   LEFT KNEE    OB History   No  obstetric history on file.      Home Medications    Prior to Admission medications   Medication Sig Start Date End Date Taking? Authorizing Provider  amitriptyline (ELAVIL) 25 MG tablet Take 1 tablet (25 mg total) by mouth at bedtime as needed for sleep. 03/22/22  Yes Kerri Perches, MD  amLODipine (NORVASC) 5 MG tablet Take 1 tablet (5 mg total) by mouth daily. 09/28/22  Yes Kerri Perches, MD  amoxicillin-clavulanate (AUGMENTIN) 875-125 MG tablet Take 1 tablet by mouth every 12 (twelve) hours. 12/12/22  Yes Particia Nearing, PA-C  Azelastine-Fluticasone 137-50 MCG/ACT SUSP Place  1 spray into the nose every 12 (twelve) hours. 04/26/22  Yes Del Newman Nip, Tenna Child, FNP  Calcium Carbonate-Vit D-Min (CALCIUM 1200) 1200-1000 MG-UNIT CHEW Chew 2 tablets by mouth daily.   Yes [provider]  Fluticasone-Umeclidin-Vilant (TRELEGY ELLIPTA) 100-62.5-25 MCG/ACT AEPB Inhale 1 puff into the lungs daily. 01/19/22  Yes Alfonse Spruce, MD  levalbuterol [facility]) 45 MCG/ACT inhaler Inhale 2 puffs into the lungs every 6 (six) hours as needed for wheezing. 01/19/22  Yes Alfonse Spruce, MD  levocetirizine (XYZAL) 5 MG tablet Take 1 tablet (5 mg total) by mouth every evening. 04/26/22  Yes Del Newman Nip, Tenna Child, FNP  pantoprazole (PROTONIX) 20 MG tablet Take 1 tablet (20 mg total) by mouth daily. 12/31/21  Yes Alfonse Spruce, MD  predniSONE (DELTASONE) 20 MG tablet Take 2 tablets (40 mg total) by mouth daily with breakfast. 12/12/22  Yes Particia Nearing, PA-C  rosuvastatin (CRESTOR) 10 MG tablet TAKE 1 TABLET BY MOUTH EVERY DAY 08/19/22  Yes Kerri Perches, MD  albuterol (VENTOLIN HFA) 108 (90 Base) MCG/ACT inhaler Inhale 2 puffs into the lungs every 6 (six) hours as needed for wheezing or shortness of breath. 05/22/20   Ambs, Norvel Richards, FNP  clobetasol (TEMOVATE) 0.05 % external solution Apply topically. 02/28/22   [provider]  EPINEPHrine (EPIPEN 2-PAK) 0.3 mg/0.3 mL IJ SOAJ injection Inject 0.3 mg into the muscle as needed for anaphylaxis. 11/30/22   Alfonse Spruce, MD  ibuprofen (ADVIL) 600 MG tablet TAKE ONE TABLET BY MOUTH EVERY 8 HOURS AS NEEDED FOR BACK AND LEFT KNEE PAIN 04/26/21   Kerri Perches, MD  levalbuterol Pauline Aus) 1.25 MG/3ML nebulizer solution Take 1.25 mg by nebulization every 4 (four) hours as needed for wheezing. 12/31/21   Alfonse Spruce, MD  Pamabrom (DIUREX MAX) 50 MG TABS Take by mouth. Takes once daily in am    [provider]  zoledronic acid (RECLAST) 5 MG/100ML SOLN injection Inject 5 mg  into the vein.    [provider]    Family History Family History  Problem Relation Age of Onset   Cancer Sister    Cancer Brother    Lupus Sister    Hypertension Sister    Aneurysm Sister        x4    Social History Social History   Tobacco Use   Smoking status: Never   Smokeless tobacco: Never  Vaping Use   Vaping status: Never Used  Substance Use Topics   Alcohol use: No   Drug use: No     Allergies   Hydrocodone, Other, and Sulfonamide derivatives   Review of Systems Review of Systems Per HPI  Physical Exam Triage Vital Signs ED Triage Vitals  Encounter Vitals Group     BP 12/12/22 1825 137/82     Systolic BP Percentile --  Diastolic BP Percentile --      Pulse Rate 12/12/22 1825 87     Resp 12/12/22 1825 18     Temp 12/12/22 1825 98.4 F (36.9 C)     Temp Source 12/12/22 1825 Oral     SpO2 12/12/22 1825 96 %     Weight --      Height --      Head Circumference --      Peak Flow --      Pain Score 12/12/22 1826 0     Pain Loc --      Pain Education --      Exclude from Growth Chart --    No data found.  Updated Vital Signs BP 137/82 (BP Location: Right Arm)   Pulse 87   Temp 98.4 F (36.9 C) (Oral)   Resp 18   SpO2 96%   Visual Acuity Right Eye Distance:   Left Eye Distance:   Bilateral Distance:    Right Eye Near:   Left Eye Near:    Bilateral Near:     Physical Exam Vitals and nursing note reviewed.  Constitutional:      Appearance: Normal appearance.  HENT:     Head: Atraumatic.     Right Ear: Tympanic membrane and external ear normal.     Left Ear: Tympanic membrane and external ear normal.     Nose: Congestion present.     Mouth/Throat:     Mouth: Mucous membranes are moist.     Pharynx: Posterior oropharyngeal erythema present.  Eyes:     Extraocular Movements: Extraocular movements intact.     Conjunctiva/sclera: Conjunctivae normal.  Cardiovascular:     Rate and Rhythm: Normal rate and regular  rhythm.     Heart sounds: Normal heart sounds.  Pulmonary:     Effort: Pulmonary effort is normal.     Breath sounds: Wheezing present. No rales.  Musculoskeletal:        General: Normal range of motion.     Cervical back: Normal range of motion and neck supple.  Skin:    General: Skin is warm and dry.  Neurological:     Mental Status: She is alert and oriented to person, place, and time.  Psychiatric:        Mood and Affect: Mood normal.        Thought Content: Thought content normal.      UC Treatments / Results  Labs (all labs ordered are listed, but only abnormal results are displayed) Labs Reviewed - No data to display  EKG   Radiology No results found.  Procedures Procedures (including critical care time)  Medications Ordered in UC Medications - No data to display  Initial Impression / Assessment and Plan / UC Course  I have reviewed the triage vital signs and the nursing notes.  Pertinent labs & imaging results that were available during my care of the patient were reviewed by me and considered in my medical decision making (see chart for details).     Given duration, worsening course will treat with Augmentin, prednisone for sinusitis and asthma exacerbation.  Discussed supportive over-the-counter medications, home care additionally.  Final Clinical Impressions(s) / UC Diagnoses   Final diagnoses:  Acute non-recurrent maxillary sinusitis  Mild intermittent asthma with acute exacerbation   Discharge Instructions   None    ED Prescriptions     Medication Sig Dispense Auth. Provider   amoxicillin-clavulanate (AUGMENTIN) 875-125 MG tablet Take 1 tablet by mouth  every 12 (twelve) hours. 14 tablet Particia Nearing, [place]   predniSONE (DELTASONE) 20 MG tablet Take 2 tablets (40 mg total) by mouth daily with breakfast. 6 tablet Particia Nearing, [place]      PDMP not reviewed this encounter.   Particia Nearing, [place] 12/16/22 1920

## 2022-12-23 ENCOUNTER — Ambulatory Visit (INDEPENDENT_AMBULATORY_CARE_PROVIDER_SITE_OTHER): Payer: Medicare Other

## 2022-12-23 DIAGNOSIS — J309 Allergic rhinitis, unspecified: Secondary | ICD-10-CM

## 2023-01-06 ENCOUNTER — Ambulatory Visit (INDEPENDENT_AMBULATORY_CARE_PROVIDER_SITE_OTHER): Payer: Medicare Other

## 2023-01-06 DIAGNOSIS — J309 Allergic rhinitis, unspecified: Secondary | ICD-10-CM | POA: Diagnosis not present

## 2023-01-13 ENCOUNTER — Ambulatory Visit (INDEPENDENT_AMBULATORY_CARE_PROVIDER_SITE_OTHER): Payer: Medicare Other

## 2023-01-13 DIAGNOSIS — J309 Allergic rhinitis, unspecified: Secondary | ICD-10-CM

## 2023-01-16 ENCOUNTER — Telehealth: Payer: Self-pay

## 2023-01-16 NOTE — Telephone Encounter (Signed)
Patient called stating she is still experiencing a chronic wet cough that she can not get rid of. In the morning she coughs up congestion that has some color to it. This cough goes on all day long. Patient is wondering if she needs to see a pulmonary provider next or is their an injection that can help with the chronic cough she experiences daily? Patient is also wondering if the cough has become an habit also?

## 2023-01-17 NOTE — Telephone Encounter (Signed)
Let's chat about this tomorrow. Remind me to talk to you about this.   Malachi Bonds, MD [facility]

## 2023-01-18 ENCOUNTER — Ambulatory Visit (INDEPENDENT_AMBULATORY_CARE_PROVIDER_SITE_OTHER): Payer: Medicare Other

## 2023-01-18 DIAGNOSIS — J309 Allergic rhinitis, unspecified: Secondary | ICD-10-CM

## 2023-01-19 ENCOUNTER — Encounter: Payer: Self-pay | Admitting: Family Medicine

## 2023-01-19 ENCOUNTER — Other Ambulatory Visit: Payer: Self-pay

## 2023-01-19 DIAGNOSIS — M5410 Radiculopathy, site unspecified: Secondary | ICD-10-CM

## 2023-01-23 ENCOUNTER — Ambulatory Visit: Payer: Medicare Other | Admitting: Allergy & Immunology

## 2023-02-03 ENCOUNTER — Ambulatory Visit: Payer: Medicare Other | Admitting: Allergy & Immunology

## 2023-02-07 DIAGNOSIS — M4316 Spondylolisthesis, lumbar region: Secondary | ICD-10-CM | POA: Diagnosis not present

## 2023-02-07 DIAGNOSIS — G8929 Other chronic pain: Secondary | ICD-10-CM | POA: Diagnosis not present

## 2023-02-07 DIAGNOSIS — M47816 Spondylosis without myelopathy or radiculopathy, lumbar region: Secondary | ICD-10-CM | POA: Diagnosis not present

## 2023-02-07 DIAGNOSIS — M5442 Lumbago with sciatica, left side: Secondary | ICD-10-CM | POA: Diagnosis not present

## 2023-02-08 ENCOUNTER — Ambulatory Visit: Payer: Medicare Other | Admitting: Allergy & Immunology

## 2023-02-08 ENCOUNTER — Encounter: Payer: Self-pay | Admitting: Allergy & Immunology

## 2023-02-08 VITALS — BP 138/80 | HR 95 | Temp 98.3°F | Resp 16 | Ht 64.76 in | Wt 159.0 lb

## 2023-02-08 DIAGNOSIS — J4540 Moderate persistent asthma, uncomplicated: Secondary | ICD-10-CM | POA: Diagnosis not present

## 2023-02-08 DIAGNOSIS — U071 COVID-19: Secondary | ICD-10-CM

## 2023-02-08 DIAGNOSIS — J309 Allergic rhinitis, unspecified: Secondary | ICD-10-CM

## 2023-02-08 DIAGNOSIS — J3089 Other allergic rhinitis: Secondary | ICD-10-CM | POA: Diagnosis not present

## 2023-02-08 DIAGNOSIS — K219 Gastro-esophageal reflux disease without esophagitis: Secondary | ICD-10-CM | POA: Diagnosis not present

## 2023-02-08 DIAGNOSIS — J302 Other seasonal allergic rhinitis: Secondary | ICD-10-CM

## 2023-02-08 MED ORDER — LEVALBUTEROL TARTRATE 45 MCG/ACT IN AERO: 2.0000 | INHALATION_SPRAY | Freq: Four times a day (QID) | RESPIRATORY_TRACT | 1 refills | Status: DC | PRN

## 2023-02-08 MED ORDER — CARBINOXAMINE MALEATE 4 MG PO TABS: 4.0000 mg | ORAL_TABLET | Freq: Two times a day (BID) | ORAL | 1 refills | Status: DC

## 2023-02-08 MED ORDER — IPRATROPIUM BROMIDE 0.03 % NA SOLN: 2.0000 | Freq: Four times a day (QID) | NASAL | 5 refills | Status: AC | PRN

## 2023-02-08 MED ORDER — LEVALBUTEROL HCL 1.25 MG/3ML IN NEBU: 1.2500 mg | INHALATION_SOLUTION | RESPIRATORY_TRACT | 3 refills | Status: DC | PRN

## 2023-02-08 MED ORDER — TRELEGY ELLIPTA 100-62.5-25 MCG/ACT IN AEPB: 1.0000 | INHALATION_SPRAY | Freq: Every day | RESPIRATORY_TRACT | 5 refills | Status: DC

## 2023-02-08 NOTE — Progress Notes (Signed)
 FOLLOW UP  Date of Service/Encounter:  02/08/23   Assessment:   Moderate persistent asthma, uncomplicated   Chronic cough - likely related to postnasal drip and uncontrolled postnasal drip (stopping allergy shots for now and working on medication management, may consider retesting in the future)    Seasonal and perennial allergic rhinitis (trees, weeds, grasses, indoor molds, outdoor molds, dust mites, cat and cockroach) - on allergen immunotherapy   Bilateral conjunctivitis with xerophthalmia - diagnosed recently with clogged tear ducts bilaterally   Shortness of breath - full cardiac work-up done   Acute laryngitis - adding prednisone  and cough suppressant today (deferring on antibiotic course)  Plan/Recommendations:   Allergic rhintiis - Stop the Xhance  and start ipratropium one spray per nostril up to four times daily. - This should help to dry out the nostril. - Stop the Xyzal  and start carbinoxamine  4mg  twice daily.  - Give us  an update in a couple of weeks. - If this does not work, we can retest you.  - Stop allergy shots for now.   2. Moderate persistent asthma, uncomplicated  - Lung testing looks great today. - I do not think that this is related to your asthma. - Daily controller medication(s): Trelegy 100/62.5/25 one puff once daily - Prior to physical activity: albuterol  2 puffs 10-15 minutes before physical activity. - Rescue medications: albuterol  4 puffs every 4-6 hours as needed - Asthma control goals:  * Full participation in all desired activities (may need albuterol  before activity) * Albuterol  use two time or less a week on average (not counting use with activity) * Cough interfering with sleep two time or less a month * Oral steroids no more than once a year * No hospitalizations  3. Reflux - Continue dietary and lifestyle modifications as listed below - Continue with pantoprazole  20 mg once a day to control reflux  4. Return in about 6 months  (around 08/08/2023). You can have the follow up appointment with Dr. Iva or a Nurse Practicioner (our Nurse Practitioners are excellent and always have Physician oversight!).    Subjective:   Denise Werner is a 68 y.o. female presenting today for follow up of  Chief Complaint  Patient presents with   Cough    Cough that has been going on for a while, drainage- coughing up yellow phlegm.     Denise Werner has a history of the following: Patient Active Problem List   Diagnosis Date Noted   PAD (peripheral artery disease) (HCC) 09/29/2022   COVID-19 04/26/2022   Encounter for Medicare annual examination with abnormal findings 03/27/2022   Welcome to Medicare preventive visit 11/21/2021   Urinary frequency 08/25/2021   Moderate persistent asthma, uncomplicated 06/26/2020   Overweight (BMI 25.0-29.9) 09/03/2019   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   Thoracic spine pain 12/03/2015   Back pain with right-sided radiculopathy 07/08/2013   Gastroesophageal reflux disease 12/18/2012   HTN (hypertension) 09/13/2011   Interstitial cystitis 09/26/2010   Osteopenia 09/20/2010   Ganglion 03/17/2010   Prediabetes 10/08/2009   Environmental allergies 01/11/2008   Cough variant asthma 01/11/2008   Hyperlipidemia LDL goal <100 08/16/2007    History obtained from: chart review and patient.  Discussed the use of AI scribe software for clinical note transcription with the patient and/or guardian, who gave verbal consent to proceed.  Denise Werner is a 68 y.o. female presenting for a follow up visit we last saw her in February 2024.  At that time, started on a prednisone  burst for acute  laryngitis.  We also gave her prescription cough medicine.  We held off on antibiotics.  Since last visit, she has had about the same.    Asthma/Respiratory Symptom History: She presents today for evaluation of a persistent cough. She presents with a persistent cough, described as a postnasal drip, which has been  causing significant discomfort and social embarrassment. The cough is reportedly worse in the mornings, with the patient estimating she coughs around 200 times each morning. The coughing episodes are so severe that they cause chest pain, which the patient describes as feeling like a tear. The cough improves as the day progresses, and is not as severe in the evenings. Her cough is causing significant distress, both physically and socially. She expresses frustration at the lack of improvement despite her compliance with her current treatment regimen. The patient is eager for a solution to her persistent cough and postnasal drip.   Allergic Rhinitis Symptom History: She has been compliant with her current allergic rhinitis treatment regimen, which includes allergy shots and the use of an inhaler (Trelegy). However, she expresses dissatisfaction with the effectiveness of the allergy shots, stating she does not feel she is making a difference. The patient also uses a nasal spray (Xhance ), but reports that it is not covered by her insurance and is therefore not a sustainable solution.  She also reports a family history of sinus cancer, which causes some concern given her current symptoms.  GERD Symptom History: She has a history of reflux, for which she takes pantoprazole  daily.   Otherwise, there have been no changes to her past medical history, surgical history, family history, or social history.    Review of systems otherwise negative other than that mentioned in the HPI.    Objective:   Blood pressure 138/80, pulse 95, temperature 98.3 F (36.8 C), resp. rate 16, height 5' 4.76 (1.645 m), weight 159 lb (72.1 kg), SpO2 96%. Body mass index is 26.65 kg/m.    Physical Exam Vitals reviewed.  Constitutional:      Appearance: She is well-developed.     Comments: Adorable as always. Hoarse but she sounds better than she did in the past. Notably, she did not cough during the visit today.   HENT:      Head: Normocephalic and atraumatic.     Right Ear: Tympanic membrane, ear canal and external ear normal.     Left Ear: Tympanic membrane, ear canal and external ear normal.     Nose: Rhinorrhea present. No nasal deformity, septal deviation or mucosal edema.     Right Turbinates: Enlarged, swollen and pale.     Left Turbinates: Enlarged, swollen and pale.     Right Sinus: No maxillary sinus tenderness or frontal sinus tenderness.     Left Sinus: No maxillary sinus tenderness or frontal sinus tenderness.     Comments: She does have some rhinorrhea.     Mouth/Throat:     Mouth: Mucous membranes are not pale and not dry.     Pharynx: Uvula midline.  Eyes:     General:        Right eye: No discharge.        Left eye: No discharge.     Conjunctiva/sclera: Conjunctivae normal.     Right eye: Right conjunctiva is not injected. No chemosis.    Left eye: Left conjunctiva is not injected. No chemosis.    Pupils: Pupils are equal, round, and reactive to light.  Cardiovascular:     Rate and  Rhythm: Normal rate and regular rhythm.     Heart sounds: Normal heart sounds.  Pulmonary:     Effort: Pulmonary effort is normal. No tachypnea, accessory muscle usage or respiratory distress.     Breath sounds: Normal breath sounds. No wheezing, rhonchi or rales.     Comments: Moving air well in all lung fields. No increased work of breathing noted.  Chest:     Chest wall: No tenderness.  Lymphadenopathy:     Cervical: No cervical adenopathy.  Skin:    General: Skin is warm.     Capillary Refill: Capillary refill takes less than 2 seconds.     Coloration: Skin is not pale.     Findings: No abrasion, erythema, petechiae or rash. Rash is not papular, urticarial or vesicular.     Comments: No eczematous or urticarial lesions noted.   Neurological:     Mental Status: She is alert.  Psychiatric:        Behavior: Behavior is cooperative.      Diagnostic studies:    Spirometry: results normal  (FEV1: 2.10/106%, FVC: 2.42/96%, FEV1/FVC: 87%).    Spirometry consistent with normal pattern.    Allergy Studies: none        Marty Shaggy, MD  [facility] 

## 2023-02-08 NOTE — Patient Instructions (Addendum)
 Allergic rhintiis - Stop the Xhance  and start ipratropium one spray per nostril up to four times daily. - This should help to dry out the nostril. - Stop the Xyzal  and start carbinoxamine  4mg  twice daily.  - Give us  an update in a couple of weeks. - If this does not work, we can retest you.  - Stop allergy shots for now.   2. Moderate persistent asthma, uncomplicated  - Lung testing looks great today. - I do not think that this is related to your asthma. - Daily controller medication(s): Trelegy 100/62.5/25 one puff once daily - Prior to physical activity: albuterol  2 puffs 10-15 minutes before physical activity. - Rescue medications: albuterol  4 puffs every 4-6 hours as needed - Asthma control goals:  * Full participation in all desired activities (may need albuterol  before activity) * Albuterol  use two time or less a week on average (not counting use with activity) * Cough interfering with sleep two time or less a month * Oral steroids no more than once a year * No hospitalizations  3. Reflux - Continue dietary and lifestyle modifications as listed below - Continue with pantoprazole  20 mg once a day to control reflux  4. Return in about 6 months (around 08/08/2023). You can have the follow up appointment with Dr. Iva or a Nurse Practicioner (our Nurse Practitioners are excellent and always have Physician oversight!).    Please inform us  of any Emergency Department visits, hospitalizations, or changes in symptoms. Call us  before going to the ED for breathing or allergy symptoms since we might be able to fit you in for a sick visit. Feel free to contact us  anytime with any questions, problems, or concerns.  It was a pleasure to see you again today!  Websites that have reliable patient information: 1. American Academy of Asthma, Allergy, and Immunology: www.aaaai.org 2. Food Allergy Research and Education (FARE): foodallergy.org 3. Mothers of Asthmatics:  http://www.asthmacommunitynetwork.org 4. American College of Allergy, Asthma, and Immunology: www.acaai.org      "Like" us  on Facebook and Instagram for our latest updates!      A healthy democracy works best when [employer] participate! Make sure you are registered to vote! If you have moved or changed any of your contact information, you will need to get this updated before voting! Scan the QR codes below to learn more!

## 2023-02-14 ENCOUNTER — Other Ambulatory Visit: Payer: Self-pay | Admitting: Neurosurgery

## 2023-02-14 DIAGNOSIS — M5442 Lumbago with sciatica, left side: Secondary | ICD-10-CM

## 2023-02-14 DIAGNOSIS — G8929 Other chronic pain: Secondary | ICD-10-CM

## 2023-02-22 ENCOUNTER — Ambulatory Visit (INDEPENDENT_AMBULATORY_CARE_PROVIDER_SITE_OTHER): Payer: Medicare Other

## 2023-02-22 VITALS — Ht 64.75 in | Wt 159.0 lb

## 2023-02-22 DIAGNOSIS — Z0000 Encounter for general adult medical examination without abnormal findings: Secondary | ICD-10-CM

## 2023-02-22 DIAGNOSIS — Z1231 Encounter for screening mammogram for malignant neoplasm of breast: Secondary | ICD-10-CM

## 2023-02-22 DIAGNOSIS — Z780 Asymptomatic menopausal state: Secondary | ICD-10-CM

## 2023-02-22 NOTE — Progress Notes (Signed)
Because this visit was a virtual/telehealth visit,  certain criteria was not obtained, such a blood pressure, CBG if applicable, and timed get up and go. Any medications not marked as "taking" were not mentioned during the medication reconciliation part of the visit. Any vitals not documented were not able to be obtained due to this being a telehealth visit or patient was unable to self-report a recent blood pressure reading due to a lack of equipment at home via telehealth. Vitals that have been documented are verbally provided by the patient.   Subjective:   Denise Werner is a 68 y.o. female who presents for Medicare Annual (Subsequent) preventive examination.  Visit Complete: Virtual I connected with  Denise Werner on 02/22/23 by a video and audio enabled telemedicine application and verified that [name] using two identifiers.  Patient Location: Home  Provider Location: Office/Clinic  I discussed the limitations of evaluation and management by telemedicine. The patient expressed understanding and agreed to proceed.  Vital Signs: Because this visit was a virtual/telehealth visit, some criteria may be missing or patient reported. Any vitals not documented were not able to be obtained and vitals that have been documented are patient reported.  Patient Medicare AWV questionnaire was completed by the patient on 02/18/2023; I have confirmed that all information answered by patient is correct and no changes since this date.  Cardiac Risk Factors include: advanced age (>46men, >22 women);dyslipidemia;hypertension     Objective:    Today's Vitals   02/22/23 1120  Weight: 159 lb (72.1 kg)  Height: 5' 4.75" (1.645 m)   Body mass index is 26.66 kg/m.     02/22/2023   11:18 AM 04/11/2018    6:47 AM 05/01/2013   12:17 PM 09/12/2011    9:26 AM  Advanced Directives  Does Patient Have a Medical Advance Directive? No No Patient would like information Patient  does not have advance directive;Patient would like information  Would patient like information on creating a medical advance directive? Yes (MAU/Ambulatory/Procedural Areas - Information given) No - Patient declined Advance directive packet given Advance directive packet given  Pre-existing out of facility DNR order (yellow form or pink MOST form)   No No    Current Medications (verified) Outpatient Encounter Medications as of 02/22/2023  Medication Sig   albuterol (VENTOLIN HFA) 108 (90 Base) MCG/ACT inhaler Inhale 2 puffs into the lungs every 6 (six) hours as needed for wheezing or shortness of breath.   amitriptyline (ELAVIL) 25 MG tablet Take 1 tablet (25 mg total) by mouth at bedtime as needed for sleep.   amLODipine (NORVASC) 5 MG tablet Take 1 tablet (5 mg total) by mouth daily.   Calcium Carbonate-Vit D-Min (CALCIUM 1200) 1200-1000 MG-UNIT CHEW Chew 2 tablets by mouth daily.   Carbinoxamine Maleate 4 MG TABS Take 1 tablet (4 mg total) by mouth in the morning and at bedtime.   clobetasol (TEMOVATE) 0.05 % external solution Apply topically.   EPINEPHrine (EPIPEN 2-PAK) 0.3 mg/0.3 mL IJ SOAJ injection Inject 0.3 mg into the muscle as needed for anaphylaxis.   Fluticasone-Umeclidin-Vilant (TRELEGY ELLIPTA) 100-62.5-25 MCG/ACT AEPB Inhale 1 puff into the lungs daily.   ibuprofen (ADVIL) 600 MG tablet TAKE ONE TABLET BY MOUTH EVERY 8 HOURS AS NEEDED FOR BACK AND LEFT KNEE PAIN   ipratropium (ATROVENT) 0.03 % nasal spray Place 2 sprays into both nostrils 4 (four) times daily as needed for rhinitis.   levalbuterol (XOPENEX HFA) 45 MCG/ACT inhaler Inhale  2 puffs into the lungs every 6 (six) hours as needed for wheezing.   levalbuterol (XOPENEX) 1.25 MG/3ML nebulizer solution Take 1.25 mg by nebulization every 4 (four) hours as needed for wheezing.   Pamabrom (DIUREX MAX) 50 MG TABS Take by mouth. Takes once daily in am   pantoprazole (PROTONIX) 20 MG tablet Take 1 tablet (20 mg total) by mouth  daily.   rosuvastatin (CRESTOR) 10 MG tablet TAKE 1 TABLET BY MOUTH EVERY DAY   zoledronic acid (RECLAST) 5 MG/100ML SOLN injection Inject 5 mg into the vein.   No facility-administered encounter medications on file as of 02/22/2023.    Allergies (verified) Hydrocodone, Other, and Sulfonamide derivatives   History: Past Medical History:  Diagnosis Date   Allergy    Arthritis    Asthma    Bladder pain    Chronic neck pain    Dyslipidemia    Frequency of urination    GERD (gastroesophageal reflux disease) OCCASIONAL   H/O hiatal hernia    Hyperlipidemia    Phreesia 01/04/2020   Hypertension    Phreesia 01/04/2020   Interstitial cystitis    Nocturia    Osteoporosis    Short of breath on exertion    Sleep apnea    Urgency of urination    Past Surgical History:  Procedure Laterality Date   ABDOMINAL HYSTERECTOMY     W/ BILATERAL SALPINGOOPHECTOMY   APPENDECTOMY     BIOPSY  04/11/2018   Procedure: BIOPSY;  Surgeon: Malissa Hippo, MD;  Location: [facility];  Service: Endoscopy;;  colon polyp with biopsy forceps   BUNIONECTOMY  04/14/2003   LEFT FOOT  W/ POST REMOVAL INTERNAL FIXATION DEVICE  VIA SILVER BUNIONECTOMY   CHOLECYSTECTOMY  02/01/1996   COLONOSCOPY N/A 04/11/2018   Procedure: COLONOSCOPY;  Surgeon: Malissa Hippo, MD;  Location: [facility];  Service: Endoscopy;  Laterality: N/A;  730   CYSTO WITH HYDRODISTENSION  09/12/2011   Procedure: CYSTOSCOPY/HYDRODISTENSION;  Surgeon: Kathi Ludwig, MD;  [facility];  Service: Urology;  Laterality: N/A;  [facility] M & P AND M & P   CYSTO/ HOD/ INSTILLATION THERAPY  11-17-2008;   12-02-1999   INTERSTITIAL CYSTITIS   ESOPHAGOGASTRODUODENOSCOPY N/A 05/01/2013   Procedure: ESOPHAGOGASTRODUODENOSCOPY (EGD);  Surgeon: Malissa Hippo, MD;  Location: [facility];  Service: Endoscopy;  Laterality: N/A;  100   FINGER SURGERY  10/13/2009   left 5th finger ARTHRODESIS   FOOT ARTHROPLASTY   05/03/2004   FIFTHE TOE RIGHT FOOT   KNEE ARTHROSCOPY  02/01/2011   LEFT KNEE   Family History  Problem Relation Age of Onset   Cancer Sister    Cancer Brother    Lupus Sister    Hypertension Sister    Aneurysm Sister        x4   Social History   Socioeconomic History   Marital status: Married    Spouse name: Not on file   Number of children: Not on file   Years of education: Not on file   Highest education level: Associate degree: academic program  Occupational History   Not on file  Tobacco Use   Smoking status: Never   Smokeless tobacco: Never  Vaping Use   Vaping status: Never Used  Substance and Sexual Activity   Alcohol use: No   Drug use: No   Sexual activity: Yes    Birth control/protection: Surgical  Other Topics Concern   Not on file  Social History  Narrative   Not on file   Social Drivers of Health   Financial Resource Strain: Low Risk  (02/22/2023)   Overall Financial Resource Strain (CARDIA)    Difficulty of Paying Living Expenses: Not hard at all  Food Insecurity: No Food Insecurity (02/22/2023)   Hunger Vital Sign    Worried About Running Out of Food in the Last Year: Never true    Ran Out of Food in the Last Year: Never true  Transportation Needs: No Transportation Needs (02/22/2023)   PRAPARE - Administrator, Civil Service (Medical): No    Lack of Transportation (Non-Medical): No  Physical Activity: Insufficiently Active (02/22/2023)   Exercise Vital Sign    Days of Exercise per Week: 3 days    Minutes of Exercise per Session: 30 min  Stress: No Stress Concern Present (02/22/2023)   [employer] of Occupational Health - Occupational Stress Questionnaire    Feeling of Stress : Not at all  Social Connections: Moderately Integrated (02/22/2023)   Social Connection and Isolation Panel [NHANES]    Frequency of Communication with Friends and Family: More than three times a week    Frequency of Social Gatherings with Friends and  Family: More than three times a week    Attends Religious Services: More than 4 times per year    Active Member of [employer] or Organizations: No    Attends Engineer, structural: Never    Marital Status: Married    Tobacco Counseling Counseling given: Yes   Clinical Intake:  Pre-visit preparation completed: Yes  Pain : No/denies pain     BMI - recorded: 26.66 Nutritional Status: BMI 25 -29 Overweight Nutritional Risks: None Diabetes: No  How often do you need to have someone help you when you read instructions, pamphlets, or other written materials from your doctor or pharmacy?: 1 - Never  Interpreter Needed?: No  Information entered by :: Maryjean Ka CMA   Activities of Daily Living    02/21/2023    9:55 AM  In your present state of health, do you have any difficulty performing the following activities:  Hearing? 0  Vision? 0  Difficulty concentrating or making decisions? 0  Walking or climbing stairs? 0  Dressing or bathing? 0  Doing errands, shopping? 0  Preparing Food and eating ? N  Using the Toilet? N  In the past six months, have you accidently leaked urine? N  Do you have problems with loss of bowel control? N  Managing your Medications? N  Managing your Finances? N  Housekeeping or managing your Housekeeping? N    Patient Care Team: Kerri Perches, MD as PCP - General Pricilla Riffle, MD as PCP - Cardiology (Cardiology) Pricilla Riffle, MD as Attending Physician (Cardiology)  Indicate any recent Medical Services you may have received from other than [facility] providers in the past year (date may be approximate).     Assessment:   This is a routine wellness examination for Belvie.  Hearing/Vision screen Hearing Screening - Comments:: Patient denies any hearing difficulties.   Vision Screening - Comments:: Wears rx glasses - up to date with routine eye exams  Patient sees Dr. Daisy Lazar w/ [facility] office.     Goals Addressed              This Visit's Progress    Patient Stated       Get back to the gym       Depression  Screen    02/22/2023   11:47 AM 09/28/2022   10:43 AM 05/17/2022    9:44 AM 05/09/2022    1:25 PM 03/22/2022    1:18 PM 11/17/2021    1:07 PM 05/21/2021   10:29 AM  PHQ 2/9 Scores  PHQ - 2 Score 0 0 0 0 0 0 0  PHQ- 9 Score 0    0      Fall Risk    02/21/2023    9:55 AM 09/28/2022   10:43 AM 05/17/2022    9:43 AM 05/09/2022    1:24 PM 03/22/2022    1:17 PM  Fall Risk   Falls in the past year? 1 0 0 0 0  Number falls in past yr: 0 0 0 0 0  Injury with Fall? 0 0 0 0 0  Risk for fall due to : No Fall Risks No Fall Risks   No Fall Risks  Follow up Falls prevention discussed Falls evaluation completed   Falls evaluation completed    MEDICARE RISK AT HOME: Medicare Risk at Home Any stairs in or around the home?: (Patient-Rptd) No If so, are there any without handrails?: (Patient-Rptd) No Home free of loose throw rugs in walkways, pet beds, electrical cords, etc?: (Patient-Rptd) Yes Adequate lighting in your home to reduce risk of falls?: (Patient-Rptd) Yes Life alert?: (Patient-Rptd) No Use of a cane, walker or w/c?: (Patient-Rptd) No Grab bars in the bathroom?: (Patient-Rptd) No Shower chair or bench in shower?: (Patient-Rptd) No Elevated toilet seat or a handicapped toilet?: (Patient-Rptd) Yes  TIMED UP AND GO:  Was the test performed?  No    Cognitive Function:        02/22/2023   11:34 AM  6CIT Screen  What Year? 0 points  What month? 0 points  What time? 0 points  Count back from 20 0 points  Months in reverse 0 points  Repeat phrase 0 points  Total Score 0 points    Immunizations Immunization History  Administered Date(s) Administered   Fluad Quad(high Dose 65+) 10/17/2021   Fluad Trivalent(High Dose 65+) 09/28/2022   Influenza Whole 10/06/2010   Influenza,inj,Quad PF,6+ Mos 10/02/2013, 11/04/2014, 10/23/2018, 01/07/2020, 02/16/2021   MODERNA COVID-19  SARS-COV-2 PEDS BIVALENT BOOSTER 14yr-48yr 10/30/2020   Moderna Covid-19 Vaccine Bivalent Booster 28yrs & up 11/12/2021   Moderna SARS-COV2 Booster Vaccination 05/18/2020   Moderna Sars-Covid-2 Vaccination 02/11/2019, 03/12/2019, 11/26/2019   PNEUMOCOCCAL CONJUGATE-20 11/17/2021   Respiratory Syncytial Virus Vaccine,Recomb Aduvanted(Arexvy) 11/12/2021   Td 09/13/2007   Tdap 10/02/2013   Zoster Recombinant(Shingrix) 12/12/2016, 05/25/2017    TDAP status: Up to date  Flu Vaccine status: Up to date  Pneumococcal vaccine status: Up to date  Covid-19 vaccine status: Information provided on how to obtain vaccines.   Qualifies for Shingles Vaccine? No   Zostavax completed No   Shingrix Completed?: Yes  Screening Tests Health Maintenance  Topic Date Due   DEXA SCAN  08/12/2022   COVID-19 Vaccine (7 - 2024-25 season) 10/02/2022   Medicare Annual Wellness (AWV)  03/28/2023   MAMMOGRAM  04/18/2023   DTaP/Tdap/Td (3 - Td or Tdap) 10/03/2023   Colonoscopy  04/10/2025   Pneumonia Vaccine 59+ Years old  Completed   INFLUENZA VACCINE  Completed   Hepatitis C Screening  Completed   Zoster Vaccines- Shingrix  Completed   HPV VACCINES  Aged Out    Health Maintenance  Health Maintenance Due  Topic Date Due   DEXA SCAN  08/12/2022   COVID-19 Vaccine (  7 - 2024-25 season) 10/02/2022   Medicare Annual Wellness (AWV)  03/28/2023    Colorectal cancer screening: Type of screening: Colonoscopy. Completed 04/11/2018. Repeat every 7 years  Mammogram status: Ordered 02/22/2023. Pt provided with contact info and advised to call to schedule appt.   Bone Density status: Ordered 02/22/2023. Pt provided with contact info and advised to call to schedule appt.  Lung Cancer Screening: (Low Dose CT Chest recommended if Age 42-80 years, 20 pack-year currently smoking OR have quit w/in 15years.) does not qualify.   Lung Cancer Screening Referral: na  Additional Screening:  Hepatitis C Screening: does  not qualify; Completed   Vision Screening: Recommended annual ophthalmology exams for early detection of glaucoma and other disorders of the eye. Is the patient up to date with their annual eye exam?  Yes  Who is the provider or what is the name of the office in which the patient attends annual eye exams? Patient sees Dr. Daisy Lazar w/ [facility] [place] office.  If pt is not established with a provider, would they like to be referred to a provider to establish care? No .   Dental Screening: Recommended annual dental exams for proper oral hygiene  Diabetic Foot Exam: na  Community Resource Referral / Chronic Care Management: CRR required this visit?  No   CCM required this visit?  No     Plan:     I have personally reviewed and noted the following in the patient's chart:   Medical and social history Use of alcohol, tobacco or illicit drugs  Current medications and supplements including opioid prescriptions. Patient is not currently taking opioid prescriptions. Functional ability and status Nutritional status Physical activity Advanced directives List of other physicians Hospitalizations, surgeries, and ER visits in previous 12 months Vitals Screenings to include cognitive, depression, and falls Referrals and appointments  In addition, I have reviewed and discussed with patient certain preventive protocols, quality metrics, and best practice recommendations. A written personalized care plan for preventive services as well as general preventive health recommendations were provided to patient.     Jordan Hawks Judith Demps, CMA   02/22/2023   After Visit Summary: (MyChart) Due to this being a telephonic visit, the after visit summary with patients personalized plan was offered to patient via MyChart   N/a

## 2023-02-22 NOTE — Patient Instructions (Signed)
Denise Werner , Thank you for taking time to come for your Medicare Wellness Visit. I appreciate your ongoing commitment to your health goals. Please review the following plan we discussed and let me know if I can assist you in the future.   Referrals/Orders/Follow-Ups/Clinician Recommendations:  Next Medicare Annual Wellness Visit:February 27, 2024 at 2:30 pm video visit  You have an order for:  []   2D Mammogram  []   3D Mammogram  [x]   Bone Density   []   Lung Cancer Screening  Please call for appointment:   [facility] [address] [place], [place] [address] [phone]  Make sure to wear two-piece clothing.  No lotions powders or deodorants the day of the appointment Make sure to bring picture ID and insurance card.  Bring list of medications you are currently taking including any supplements.   Schedule your [place] screening mammogram through MyChart!   Log into your MyChart account.  Go to 'Visit' (or 'Appointments' if on mobile App) --> Schedule an Appointment  Under 'Select a Reason for Visit' choose the Mammogram Screening option.  Complete the pre-visit questions and select the time and place that best fits your schedule.  You have an order for:  []   2D Mammogram  [x]   3D Mammogram  []   Bone Density     Please call for appointment:  [facility] [address] [place], [place] [address] [phone]  Make sure to wear two-piece clothing.  No lotions powders or deodorants the day of the appointment Make sure to bring picture ID and insurance card.  Bring list of medications you are currently taking including any supplements.   Schedule your [place] screening mammogram through MyChart!   Log into your MyChart account.  Go to 'Visit' (or 'Appointments' if on mobile App) --> Schedule an Appointment  Under 'Select a Reason for Visit' choose the Mammogram Screening option.  Complete the  pre-visit questions and select the time and place that best fits your schedule.    This is a list of the screening recommended for you and due dates:  Health Maintenance  Topic Date Due   DEXA scan (bone density measurement)  08/12/2022   COVID-19 Vaccine (7 - 2024-25 season) 10/02/2022   Mammogram  04/18/2023   DTaP/Tdap/Td vaccine (3 - Td or Tdap) 10/03/2023   Medicare Annual Wellness Visit  02/22/2024   Colon Cancer Screening  04/10/2025   Pneumonia Vaccine  Completed   Flu Shot  Completed   Hepatitis C Screening  Completed   Zoster (Shingles) Vaccine  Completed   HPV Vaccine  Aged Out    Advanced directives: (Provided) Advance directive discussed with you today. I have provided a copy for you to complete at home and have notarized. Once this is complete, please bring a copy in to our office so we can scan it into your chart.   Next Medicare Annual Wellness Visit scheduled for next year: yes  Preventive Care 67 Years and Older, Female Preventive care refers to lifestyle choices and visits with your health care provider that can promote health and wellness. Preventive care visits are also called wellness exams. What can I expect for my preventive care visit? Counseling Your health care provider may ask you questions about your: Medical history, including: Past medical problems. Family medical history. Pregnancy and menstrual history. History of falls. Current health, including: Memory and ability to understand (cognition). Emotional well-being. Home life and relationship well-being.  Sexual activity and sexual health. Lifestyle, including: Alcohol, nicotine or tobacco, and drug use. Access to firearms. Diet, exercise, and sleep habits. Work and work Astronomer. Sunscreen use. Safety issues such as seatbelt and bike helmet use. Physical exam Your health care provider will check your: Height and weight. These may be used to calculate your BMI (body mass index). BMI is  a measurement that tells if you are at a healthy weight. Waist circumference. This measures the distance around your waistline. This measurement also tells if you are at a healthy weight and may help predict your risk of certain diseases, such as type 2 diabetes and high blood pressure. Heart rate and blood pressure. Body temperature. Skin for abnormal spots. What immunizations do I need?  Vaccines are usually given at various ages, according to a schedule. Your health care provider will recommend vaccines for you based on your age, medical history, and lifestyle or other factors, such as travel or where you work. What tests do I need? Screening Your health care provider may recommend screening tests for certain conditions. This may include: Lipid and cholesterol levels. Hepatitis C test. Hepatitis B test. HIV (human immunodeficiency virus) test. STI (sexually transmitted infection) testing, if you are at risk. Lung cancer screening. Colorectal cancer screening. Diabetes screening. This is done by checking your blood sugar (glucose) after you have not eaten for a while (fasting). Mammogram. Talk with your health care provider about how often you should have regular mammograms. BRCA-related cancer screening. This may be done if you have a family history of breast, ovarian, tubal, or peritoneal cancers. Bone density scan. This is done to screen for osteoporosis. Talk with your health care provider about your test results, treatment options, and if necessary, the need for more tests. Follow these instructions at home: Eating and drinking  Eat a diet that includes fresh fruits and vegetables, whole grains, lean protein, and low-fat dairy products. Limit your intake of foods with high amounts of sugar, saturated fats, and salt. Take vitamin and mineral supplements as recommended by your health care provider. Do not drink alcohol if your health care provider tells you not to drink. If you  drink alcohol: Limit how much you have to 0-1 drink a day. Know how much alcohol is in your drink. In the [place] one drink equals one 12 oz bottle of beer (355 mL), one 5 oz glass of wine (148 mL), or one 1 oz glass of hard liquor (44 mL). Lifestyle Brush your teeth every morning and night with fluoride toothpaste. Floss one time each day. Exercise for at least 30 minutes 5 or more days each week. Do not use any products that contain nicotine or tobacco. These products include cigarettes, chewing tobacco, and vaping devices, such as e-cigarettes. If you need help quitting, ask your health care provider. Do not use drugs. If you are sexually active, practice safe sex. Use a condom or other form of protection in order to prevent STIs. Take aspirin only as told by your health care provider. Make sure that you understand how much to take and what form to take. Work with your health care provider to find out whether it is safe and beneficial for you to take aspirin daily. Ask your health care provider if you need to take a cholesterol-lowering medicine (statin). Find healthy ways to manage stress, such as: Meditation, yoga, or listening to music. Journaling. Talking to a trusted person. Spending time with friends and family. Minimize exposure to UV radiation to  reduce your risk of skin cancer. Safety Always wear your seat belt while driving or riding in a vehicle. Do not drive: If you have been drinking alcohol. Do not ride with someone who has been drinking. When you are tired or distracted. While texting. If you have been using any mind-altering substances or drugs. Wear a helmet and other protective equipment during sports activities. If you have firearms in your house, make sure you follow all gun safety procedures. What's next? Visit your health care provider once a year for an annual wellness visit. Ask your health care provider how often you should have your eyes and teeth  checked. Stay up to date on all vaccines. This information is not intended to replace advice given to you by your health care provider. Make sure you discuss any questions you have with your health care provider. Document Revised: 07/15/2020 Document Reviewed: 07/15/2020 Elsevier Patient Education  2024 [employer].  Understanding Your Risk for Falls Millions of people have serious injuries from falls each year. It is important to understand your risk of falling. Talk with your health care provider about your risk and what you can do to lower it. If you do have a serious fall, make sure to tell your provider. Falling once raises your risk of falling again. How can falls affect me? Serious injuries from falls are common. These include: Broken bones, such as hip fractures. Head injuries, such as traumatic brain injuries (TBI) or concussions. A fear of falling can cause you to avoid activities and stay at home. This can make your muscles weaker and raise your risk for a fall. What can increase my risk? There are a number of risk factors that increase your risk for falling. The more risk factors you have, the higher your risk of falling. Serious injuries from a fall happen most often to people who are older than 67 years old. Teenagers and young adults ages 2-29 are also at higher risk. Common risk factors include: Weakness in the lower body. Being generally weak or confused due to long-term (chronic) illness. Dizziness or balance problems. Poor vision. Medicines that cause dizziness or drowsiness. These may include: Medicines for your blood pressure, heart, anxiety, insomnia, or swelling (edema). Pain medicines. Muscle relaxants. Other risk factors include: Drinking alcohol. Having had a fall in the past. Having foot pain or wearing improper footwear. Working at a dangerous job. Having any of the following in your home: Tripping hazards, such as floor clutter or loose rugs. Poor  lighting. Pets. Having dementia or memory loss. What actions can I take to lower my risk of falling?     Physical activity Stay physically fit. Do strength and balance exercises. Consider taking a regular class to build strength and balance. Yoga and tai chi are good options. Vision Have your eyes checked every year and your prescription for glasses or contacts updated as needed. Shoes and walking aids Wear non-skid shoes. Wear shoes that have rubber soles and low heels. Do not wear high heels. Do not walk around the house in socks or slippers. Use a cane or walker as told by your provider. Home safety Attach secure railings on both sides of your stairs. Install grab bars for your bathtub, shower, and toilet. Use a non-skid mat in your bathtub or shower. Attach bath mats securely with double-sided, non-slip rug tape. Use good lighting in all rooms. Keep a flashlight near your bed. Make sure there is a clear path from your bed to the bathroom.  Use night-lights. Do not use throw rugs. Make sure all carpeting is taped or tacked down securely. Remove all clutter from walkways and stairways, including extension cords. Repair uneven or broken steps and floors. Avoid walking on icy or slippery surfaces. Walk on the grass instead of on icy or slick sidewalks. Use ice melter to get rid of ice on walkways in the winter. Use a cordless phone. Questions to ask your health care provider Can you help me check my risk for a fall? Do any of my medicines make me more likely to fall? Should I take a vitamin D supplement? What exercises can I do to improve my strength and balance? Should I make an appointment to have my vision checked? Do I need a bone density test to check for weak bones (osteoporosis)? Would it help to use a cane or a walker? Where to find more information Centers for Disease Control and Prevention, STEADI: TonerPromos.no Community-Based Fall Prevention Programs: TonerPromos.no [employer] on Aging: BaseRingTones.pl Contact a health care provider if: You fall at home. You are afraid of falling at home. You feel weak, drowsy, or dizzy. This information is not intended to replace advice given to you by your health care provider. Make sure you discuss any questions you have with your health care provider. Document Revised: 09/20/2021 Document Reviewed: 09/20/2021 Elsevier Patient Education  2024 [employer].

## 2023-02-27 NOTE — Discharge Instructions (Signed)
Myelogram Discharge Instructions  Go home and rest quietly as needed. You may resume normal activities; however, do not exert yourself strongly or do any heavy lifting today and tomorrow.   DO NOT drive today.    You may resume your normal diet and medications unless otherwise indicated. Drink lots of extra fluids today and tomorrow.   The incidence of headache, nausea, or vomiting is about 5% (one in 20 patients).  If you develop a headache, lie flat for 24 hours and drink plenty of fluids until the headache goes away.  Caffeinated beverages may be helpful. If when you get up you still have a headache when standing, go back to bed and force fluids for another 24 hours.   If you develop severe nausea and vomiting or a headache that does not go away with the flat bedrest after 48 hours, please call [phone].   Call your physician for a follow-up appointment.  The results of your myelogram will be sent directly to your physician by the following day.  If you have any questions or if complications develop after you arrive home, please call [phone].  Discharge instructions have been explained to the patient.  The patient, or the person responsible for the patient, fully understands these instructions.   Thank you for visiting our office today.

## 2023-02-28 ENCOUNTER — Other Ambulatory Visit: Payer: Medicare Other

## 2023-02-28 ENCOUNTER — Ambulatory Visit
Admission: RE | Admit: 2023-02-28 | Discharge: 2023-02-28 | Disposition: A | Discharge status: Home / Self Care | Payer: Medicare Other | Source: Ambulatory Visit | Attending: Neurosurgery | Admitting: Neurosurgery

## 2023-02-28 DIAGNOSIS — M4726 Other spondylosis with radiculopathy, lumbar region: Secondary | ICD-10-CM | POA: Diagnosis not present

## 2023-02-28 DIAGNOSIS — G8929 Other chronic pain: Secondary | ICD-10-CM

## 2023-02-28 DIAGNOSIS — M48061 Spinal stenosis, lumbar region without neurogenic claudication: Secondary | ICD-10-CM | POA: Diagnosis not present

## 2023-02-28 DIAGNOSIS — M5116 Intervertebral disc disorders with radiculopathy, lumbar region: Secondary | ICD-10-CM | POA: Diagnosis not present

## 2023-02-28 DIAGNOSIS — M4316 Spondylolisthesis, lumbar region: Secondary | ICD-10-CM | POA: Diagnosis not present

## 2023-02-28 MED ORDER — IOPAMIDOL (ISOVUE-M 200) INJECTION 41%
20.0000 mL | Freq: Once | INTRAMUSCULAR | Status: AC
  Administered 2023-02-28: 20 mL via INTRATHECAL

## 2023-02-28 MED ORDER — MEPERIDINE HCL 50 MG/ML IJ SOLN: 50.0000 mg | Freq: Once | INTRAMUSCULAR | Status: DC | PRN

## 2023-02-28 MED ORDER — ONDANSETRON HCL 4 MG/2ML IJ SOLN: 4.0000 mg | Freq: Once | INTRAMUSCULAR | Status: DC | PRN

## 2023-02-28 MED ORDER — DIAZEPAM 5 MG PO TABS
5.0000 mg | ORAL_TABLET | Freq: Once | ORAL | Status: AC
  Administered 2023-02-28: 5 mg via ORAL

## 2023-03-04 ENCOUNTER — Other Ambulatory Visit: Payer: Self-pay | Admitting: Allergy & Immunology

## 2023-03-06 DIAGNOSIS — D485 Neoplasm of uncertain behavior of skin: Secondary | ICD-10-CM | POA: Diagnosis not present

## 2023-03-06 DIAGNOSIS — D235 Other benign neoplasm of skin of trunk: Secondary | ICD-10-CM | POA: Diagnosis not present

## 2023-03-06 DIAGNOSIS — L821 Other seborrheic keratosis: Secondary | ICD-10-CM | POA: Diagnosis not present

## 2023-03-08 DIAGNOSIS — M79671 Pain in right foot: Secondary | ICD-10-CM | POA: Diagnosis not present

## 2023-03-08 DIAGNOSIS — M546 Pain in thoracic spine: Secondary | ICD-10-CM | POA: Diagnosis not present

## 2023-03-08 DIAGNOSIS — M7918 Myalgia, other site: Secondary | ICD-10-CM | POA: Diagnosis not present

## 2023-03-08 DIAGNOSIS — M13871 Other specified arthritis, right ankle and foot: Secondary | ICD-10-CM | POA: Diagnosis not present

## 2023-03-16 ENCOUNTER — Telehealth: Payer: Self-pay

## 2023-03-16 NOTE — Telephone Encounter (Signed)
-----   Message from Alfonse Spruce sent at 03/16/2023  1:04 PM EST ----- Can someone call to get an update on her coughing? ----- Message ----- From: Ralene Muskrat, CMA Sent: 02/13/2023   5:02 PM EST To: Alfonse Spruce, MD; #  Called and left a message for patient to call our office back to give an update on her cough. ----- Message ----- From: Alfonse Spruce, MD Sent: 02/13/2023   7:33 AM EST To: [facility]  Can someone call and see how her cough is with the medication changes? Thx!

## 2023-03-16 NOTE — Telephone Encounter (Signed)
I called patient and she has been doing better the cough is not like it was. It is in between a dry/wet cough. Patient believes it is due to the weather changes. She appreciates Dr.Gallagher checking up on her.

## 2023-03-21 DIAGNOSIS — M47816 Spondylosis without myelopathy or radiculopathy, lumbar region: Secondary | ICD-10-CM | POA: Diagnosis not present

## 2023-03-21 DIAGNOSIS — M48061 Spinal stenosis, lumbar region without neurogenic claudication: Secondary | ICD-10-CM | POA: Diagnosis not present

## 2023-03-21 DIAGNOSIS — M4316 Spondylolisthesis, lumbar region: Secondary | ICD-10-CM | POA: Diagnosis not present

## 2023-03-27 DIAGNOSIS — E785 Hyperlipidemia, unspecified: Secondary | ICD-10-CM | POA: Diagnosis not present

## 2023-03-27 DIAGNOSIS — I10 Essential (primary) hypertension: Secondary | ICD-10-CM | POA: Diagnosis not present

## 2023-03-27 DIAGNOSIS — R7303 Prediabetes: Secondary | ICD-10-CM | POA: Diagnosis not present

## 2023-03-28 ENCOUNTER — Ambulatory Visit (INDEPENDENT_AMBULATORY_CARE_PROVIDER_SITE_OTHER): Payer: Medicare Other | Admitting: Family Medicine

## 2023-03-28 VITALS — BP 119/78 | HR 89 | Ht 64.75 in | Wt 157.1 lb

## 2023-03-28 DIAGNOSIS — E785 Hyperlipidemia, unspecified: Secondary | ICD-10-CM

## 2023-03-28 DIAGNOSIS — I10 Essential (primary) hypertension: Secondary | ICD-10-CM

## 2023-03-28 DIAGNOSIS — R7303 Prediabetes: Secondary | ICD-10-CM | POA: Diagnosis not present

## 2023-03-28 DIAGNOSIS — Z0001 Encounter for general adult medical examination with abnormal findings: Secondary | ICD-10-CM

## 2023-03-28 DIAGNOSIS — Z9109 Other allergy status, other than to drugs and biological substances: Secondary | ICD-10-CM | POA: Diagnosis not present

## 2023-03-28 DIAGNOSIS — J320 Chronic maxillary sinusitis: Secondary | ICD-10-CM

## 2023-03-28 DIAGNOSIS — M810 Age-related osteoporosis without current pathological fracture: Secondary | ICD-10-CM | POA: Insufficient documentation

## 2023-03-28 DIAGNOSIS — M5410 Radiculopathy, site unspecified: Secondary | ICD-10-CM

## 2023-03-28 DIAGNOSIS — J329 Chronic sinusitis, unspecified: Secondary | ICD-10-CM

## 2023-03-28 DIAGNOSIS — M8580 Other specified disorders of bone density and structure, unspecified site: Secondary | ICD-10-CM | POA: Diagnosis not present

## 2023-03-28 DIAGNOSIS — Z1211 Encounter for screening for malignant neoplasm of colon: Secondary | ICD-10-CM

## 2023-03-28 DIAGNOSIS — R748 Abnormal levels of other serum enzymes: Secondary | ICD-10-CM

## 2023-03-28 LAB — CBC
Hematocrit: 39.3 % (ref 34.0–46.6)
Hemoglobin: 12.7 g/dL (ref 11.1–15.9)
MCH: 27.8 pg (ref 26.6–33.0)
MCHC: 32.3 g/dL (ref 31.5–35.7)
MCV: 86 fL (ref 79–97)
Platelets: 263 10*3/uL (ref 150–450)
RBC: 4.57 x10E6/uL (ref 3.77–5.28)
RDW: 12.6 % (ref 11.7–15.4)
WBC: 5.2 10*3/uL (ref 3.4–10.8)

## 2023-03-28 LAB — CMP14+EGFR
ALT: 24 [IU]/L (ref 0–32)
AST: 24 [IU]/L (ref 0–40)
Albumin: 4.1 g/dL (ref 3.9–4.9)
Alkaline Phosphatase: 126 [IU]/L — ABNORMAL HIGH (ref 44–121)
BUN/Creatinine Ratio: 16 (ref 12–28)
BUN: 16 mg/dL (ref 8–27)
Bilirubin Total: 0.3 mg/dL (ref 0.0–1.2)
CO2: 24 mmol/L (ref 20–29)
Calcium: 9.4 mg/dL (ref 8.7–10.3)
Chloride: 107 mmol/L — ABNORMAL HIGH (ref 96–106)
Creatinine, Ser: 0.97 mg/dL (ref 0.57–1.00)
Globulin, Total: 2.8 g/dL (ref 1.5–4.5)
Glucose: 94 mg/dL (ref 70–99)
Potassium: 4.4 mmol/L (ref 3.5–5.2)
Sodium: 143 mmol/L (ref 134–144)
Total Protein: 6.9 g/dL (ref 6.0–8.5)
eGFR: 64 mL/min/{1.73_m2} (ref >59)

## 2023-03-28 LAB — LIPID PANEL
Chol/HDL Ratio: 4.7 {ratio} — ABNORMAL HIGH (ref 0.0–4.4)
Cholesterol, Total: 244 mg/dL — ABNORMAL HIGH (ref 100–199)
HDL: 52 mg/dL (ref >39)
LDL Chol Calc (NIH): 182 mg/dL — ABNORMAL HIGH (ref 0–99)
Triglycerides: 63 mg/dL (ref 0–149)
VLDL Cholesterol Cal: 10 mg/dL (ref 5–40)

## 2023-03-28 LAB — HEMOGLOBIN A1C
Est. average glucose Bld gHb Est-mCnc: 126 mg/dL
Hgb A1c MFr Bld: 6 % — ABNORMAL HIGH (ref 4.8–5.6)

## 2023-03-28 LAB — TSH: TSH: 1.34 u[IU]/mL (ref 0.450–4.500)

## 2023-03-28 MED ORDER — AMLODIPINE BESYLATE 5 MG PO TABS: 5.0000 mg | ORAL_TABLET | Freq: Every day | ORAL | 3 refills | Status: AC

## 2023-03-28 MED ORDER — ROSUVASTATIN CALCIUM 20 MG PO TABS: 20.0000 mg | ORAL_TABLET | Freq: Every day | ORAL | 1 refills | Status: AC

## 2023-03-28 NOTE — Progress Notes (Signed)
    Denise Werner     MRN: [id number]      DOB: 11-07-1955  Chief complaint: Annual exam HPI: Patient is in for annual physical exam. Chronic uncontrolled cough despite using allergy meds somewhat as prescribed, not totally compliant, had been on immunotherapy, has discontinued them some while  back and cough is uncontrolled C/o several week h/o post nasal drainage green. Denies recent fever or chills, body aches or malaise Recent labs,  are reviewed.medication adjustment made for hyperlipidemia Immunization is reviewed .   PE: BP 119/78   Pulse 89   Ht 5' 4.75" (1.645 m)   Wt 157 lb 1.9 oz (71.3 kg)   SpO2 91%   BMI 26.35 kg/m   Pleasant  female, alert and oriented x 3, in no cardio-pulmonary distress. Afebrile. HEENT No facial trauma or asymetry. Mild maxillary sinus tenderness  Extra occullar muscles intact.. External ears normal, .TM clear, oropharynx no erythema or exudate Neck: supple, no adenopathy,JVD or thyromegaly.No bruits.  Chest: Clear to ascultation bilaterally.No crackles or wheezes. Non tender to palpation  Breast: Not examined, mammogram is UTD, pt is asymptomatic  Cardiovascular system; Heart sounds normal,  S1 and  S2 ,no S3.  No murmur, or thrill. Apical beat not displaced Peripheral pulses normal.  Abdomen: Soft, non tender, no organomegaly or masses. No bruits. Bowel sounds normal. No guarding, tenderness or rebound.   Musculoskeletal exam: Decreased though adeqiuate ROM of spine, normal in  hips , shoulders and knees. No deformity ,swelling or crepitus noted. No muscle wasting or atrophy.   Neurologic: Cranial nerves 2 to 12 intact. Power, tone ,sensation and reflexes normal throughout. No disturbance in gait. No tremor.  Skin: Intact, no ulceration, erythema , scaling or rash noted. Pigmentation normal throughout  Psych; Normal mood and affect. Judgement and concentration normal   Assessment & Plan:  Encounter for Medicare  annual examination with abnormal findings Annual exam as documented. Immunization and cancer screening needs are specifically addressed at this visit.   Hyperlipidemia LDL goal <100 Uncontrolled and deteriorated Hyperlipidemia:Low fat diet discussed and encouraged.   Lipid Panel  Lab Results  Component Value Date   CHOL 244 (H) 03/27/2023   HDL 52 03/27/2023   LDLCALC 182 (H) 03/27/2023   TRIG 63 03/27/2023   CHOLHDL 4.7 (H) 03/27/2023     Increase dose crestor and rept in 4 months  Environmental allergies Currently uncontrolled , chronic  drainage and cough Needs to comply witjmedication prescribed for daily use , if symptoms persist will need re evaluation for resuming immunotherapy  Elevated alkaline phosphatase level Slight increase first time noted, rept lab in 6 to 8 weeks, if remains elevated refer to GI  Osteopenia Updated dexa needed  Back pain with right-sided radiculopathy Had recent myelogram and Neurosurgery consult, no clear benefit of surgery established , reports living with level 7 chronic back pain and is willing to continue this currently  Rhinosinusitis Reports chronic yellow/ green post nasal drainage, specimen for culture needed

## 2023-03-28 NOTE — Patient Instructions (Addendum)
 F/u in 4 months, call if you need mke sooner  Pls schedule Dexa  at checkout, past due!  Sputum / nasal drainage to be submitted for culture to evaluate for chronic sinus infection  Need to commit to flonase and carbinoxamine as directed by your allergist, cough is uncontrolled   Liver function test    and vit D level  in 6 weeks, if alkaline phosphatase is still elevated I will refer you to GI  Nurse pleas arrange FIT test for colon cancer screening  Cholesterol is high, need to change diet and  higher dose of crestor is prescribed  Fasting lipid, cmp and EGFr and hBA1C in 4  months, 3 to 5 days before next appointment  It is important that you exercise regularly at least 30 minutes 5 times a week. If you develop chest pain, have severe difficulty breathing, or feel very tired, stop exercising immediately and seek medical attention   Thanks for choosing [facility], we consider it a privelige to serve you.

## 2023-04-02 DIAGNOSIS — J329 Chronic sinusitis, unspecified: Secondary | ICD-10-CM | POA: Insufficient documentation

## 2023-04-02 NOTE — Assessment & Plan Note (Signed)
 Annual exam as documented. . Immunization and cancer screening needs are specifically addressed at this visit.

## 2023-04-02 NOTE — Assessment & Plan Note (Addendum)
 Reports chronic yellow/ green post nasal drainage, specimen for culture needed

## 2023-04-02 NOTE — Assessment & Plan Note (Signed)
 Slight increase first time noted, rept lab in 6 to 8 weeks, if remains elevated refer to GI

## 2023-04-02 NOTE — Assessment & Plan Note (Signed)
 Uncontrolled and deteriorated Hyperlipidemia:Low fat diet discussed and encouraged.   Lipid Panel  Lab Results  Component Value Date   CHOL 244 (H) 03/27/2023   HDL 52 03/27/2023   LDLCALC 182 (H) 03/27/2023   TRIG 63 03/27/2023   CHOLHDL 4.7 (H) 03/27/2023     Increase dose crestor and rept in 4 months

## 2023-04-02 NOTE — Assessment & Plan Note (Signed)
 Had recent myelogram and Neurosurgery consult, no clear benefit of surgery established , reports living with level 7 chronic back pain and is willing to continue this currently

## 2023-04-02 NOTE — Assessment & Plan Note (Signed)
 Currently uncontrolled , chronic  drainage and cough Needs to comply witjmedication prescribed for daily use , if symptoms persist will need re evaluation for resuming immunotherapy

## 2023-04-02 NOTE — Assessment & Plan Note (Signed)
Updated dexa needed

## 2023-04-04 ENCOUNTER — Encounter: Payer: Self-pay | Admitting: Family Medicine

## 2023-04-04 ENCOUNTER — Ambulatory Visit (HOSPITAL_COMMUNITY)
Admission: RE | Admit: 2023-04-04 | Discharge: 2023-04-04 | Disposition: A | Discharge status: Home / Self Care | Payer: 59 | Source: Ambulatory Visit | Attending: Family Medicine | Admitting: Family Medicine

## 2023-04-04 DIAGNOSIS — Z0000 Encounter for general adult medical examination without abnormal findings: Secondary | ICD-10-CM | POA: Insufficient documentation

## 2023-04-04 DIAGNOSIS — Z780 Asymptomatic menopausal state: Secondary | ICD-10-CM | POA: Insufficient documentation

## 2023-04-19 ENCOUNTER — Ambulatory Visit
Admission: RE | Admit: 2023-04-19 | Discharge: 2023-04-19 | Disposition: A | Discharge status: Home / Self Care | Payer: Medicare Other | Source: Ambulatory Visit | Attending: Family Medicine | Admitting: Family Medicine

## 2023-04-19 DIAGNOSIS — Z1231 Encounter for screening mammogram for malignant neoplasm of breast: Secondary | ICD-10-CM | POA: Diagnosis not present

## 2023-04-19 DIAGNOSIS — Z0000 Encounter for general adult medical examination without abnormal findings: Secondary | ICD-10-CM

## 2023-04-25 ENCOUNTER — Other Ambulatory Visit: Payer: Self-pay | Admitting: Allergy & Immunology

## 2023-04-26 NOTE — Telephone Encounter (Signed)
 Refill for pantoprazole 20 mg x 90 day supply with no refills sent to [facility].

## 2023-07-26 ENCOUNTER — Ambulatory Visit: Payer: 59 | Admitting: Family Medicine

## 2023-07-31 NOTE — Progress Notes (Deleted)
 Appt cancelled

## 2023-08-01 DIAGNOSIS — R7303 Prediabetes: Secondary | ICD-10-CM | POA: Diagnosis not present

## 2023-08-01 DIAGNOSIS — I10 Essential (primary) hypertension: Secondary | ICD-10-CM | POA: Diagnosis not present

## 2023-08-01 DIAGNOSIS — R748 Abnormal levels of other serum enzymes: Secondary | ICD-10-CM | POA: Diagnosis not present

## 2023-08-01 DIAGNOSIS — M810 Age-related osteoporosis without current pathological fracture: Secondary | ICD-10-CM | POA: Diagnosis not present

## 2023-08-02 ENCOUNTER — Other Ambulatory Visit: Payer: Self-pay | Admitting: Allergy & Immunology

## 2023-08-02 ENCOUNTER — Ambulatory Visit: Payer: Self-pay | Admitting: Family Medicine

## 2023-08-02 ENCOUNTER — Ambulatory Visit: Admitting: Internal Medicine

## 2023-08-02 LAB — HEMOGLOBIN A1C
Est. average glucose Bld gHb Est-mCnc: 123 mg/dL
Hgb A1c MFr Bld: 5.9 % — ABNORMAL HIGH (ref 4.8–5.6)

## 2023-08-02 LAB — CMP14+EGFR
ALT: 21 IU/L (ref 0–32)
AST: 20 IU/L (ref 0–40)
Albumin: 4.5 g/dL (ref 3.9–4.9)
Alkaline Phosphatase: 118 IU/L (ref 44–121)
BUN/Creatinine Ratio: 19 (ref 12–28)
BUN: 15 mg/dL (ref 8–27)
Bilirubin Total: 0.3 mg/dL (ref 0.0–1.2)
CO2: 23 mmol/L (ref 20–29)
Calcium: 9.6 mg/dL (ref 8.7–10.3)
Chloride: 102 mmol/L (ref 96–106)
Creatinine, Ser: 0.8 mg/dL (ref 0.57–1.00)
Globulin, Total: 2.8 g/dL (ref 1.5–4.5)
Glucose: 98 mg/dL (ref 70–99)
Potassium: 4.1 mmol/L (ref 3.5–5.2)
Sodium: 141 mmol/L (ref 134–144)
Total Protein: 7.3 g/dL (ref 6.0–8.5)
eGFR: 81 mL/min/{1.73_m2} (ref >59)

## 2023-08-02 LAB — HEPATIC FUNCTION PANEL: Bilirubin, Direct: 0.13 mg/dL (ref 0.00–0.40)

## 2023-08-02 LAB — LIPID PANEL
Chol/HDL Ratio: 2.5 ratio (ref 0.0–4.4)
Cholesterol, Total: 146 mg/dL (ref 100–199)
HDL: 59 mg/dL (ref >39)
LDL Chol Calc (NIH): 78 mg/dL (ref 0–99)
Triglycerides: 36 mg/dL (ref 0–149)
VLDL Cholesterol Cal: 9 mg/dL (ref 5–40)

## 2023-08-02 LAB — VITAMIN D 25 HYDROXY (VIT D DEFICIENCY, FRACTURES): Vit D, 25-Hydroxy: 37.8 ng/mL (ref 30.0–100.0)

## 2023-08-03 ENCOUNTER — Encounter: Payer: Self-pay | Admitting: Family Medicine

## 2023-08-03 ENCOUNTER — Ambulatory Visit (INDEPENDENT_AMBULATORY_CARE_PROVIDER_SITE_OTHER): Admitting: Family Medicine

## 2023-08-03 VITALS — BP 110/67 | HR 87 | Resp 18 | Ht 66.0 in | Wt 152.0 lb

## 2023-08-03 DIAGNOSIS — I10 Essential (primary) hypertension: Secondary | ICD-10-CM | POA: Diagnosis not present

## 2023-08-03 DIAGNOSIS — R7303 Prediabetes: Secondary | ICD-10-CM

## 2023-08-03 DIAGNOSIS — E785 Hyperlipidemia, unspecified: Secondary | ICD-10-CM | POA: Diagnosis not present

## 2023-08-03 DIAGNOSIS — R229 Localized swelling, mass and lump, unspecified: Secondary | ICD-10-CM | POA: Diagnosis not present

## 2023-08-03 DIAGNOSIS — M792 Neuralgia and neuritis, unspecified: Secondary | ICD-10-CM | POA: Diagnosis not present

## 2023-08-03 NOTE — Patient Instructions (Addendum)
 Annual exam in Feb, 2026,  call if you need me sooner  Call 2 to 3 weeks before your Feb appointment for lab order to get before your  visit, please  B12 level and magnesium are added to your recent lab due to nerve pain in fingers and toes  I will refer you again for testing for nerve damage, needs clarification will message pt, may have been referring to PAD   You have cystic nodules in right arm under the skin,no imaging or additional management recommended now. If they get larger , start hurting or you  become concerned , send a message I will refer you for an [place]  of the area  Keep up the GREAT work!  It is important that you exercise regularly at least 30 minutes 5 times a week. If you develop chest pain, have severe difficulty breathing, or feel very tired, stop exercising immediately and seek medical attention   Think about what you will eat, plan ahead. Choose  clean, green, fresh or frozen over canned, processed or packaged foods which are more sugary, salty and fatty. 70 to 75% of food eaten should be vegetables and fruit. Three meals at set times with snacks allowed between meals, but they must be fruit or vegetables. Aim to eat over a 12 hour period , example 7 am to 7 pm, and STOP after  your last meal of the day. Drink water ,generally about 64 ounces per day, no other drink is as healthy. Fruit juice is best enjoyed in a healthy way, by EATING the fruit. Thanks for choosing [facility], we consider it a privelige to serve you.

## 2023-08-07 ENCOUNTER — Encounter: Payer: Self-pay | Admitting: Family Medicine

## 2023-08-07 NOTE — Assessment & Plan Note (Signed)
 Hyperlipidemia:Low fat diet discussed and encouraged.   Lipid Panel  Lab Results  Component Value Date   CHOL 146 08/01/2023   HDL 59 08/01/2023   LDLCALC 78 08/01/2023   TRIG 36 08/01/2023   CHOLHDL 2.5 08/01/2023     Controlled, no change in medication

## 2023-08-07 NOTE — Assessment & Plan Note (Signed)
 Controlled, no change in medication DASH diet and commitment to daily physical activity for a minimum of 30 minutes discussed and encouraged, as a part of hypertension management. The importance of attaining a healthy weight is also discussed.     08/03/2023    2:21 PM 03/28/2023    9:54 AM 02/28/2023    2:11 PM 02/28/2023   12:37 PM 02/22/2023   11:20 AM 02/08/2023    2:03 PM 12/12/2022    6:25 PM  BP/Weight  Systolic BP 110 119 128 141 -- 138 137  Diastolic BP 67 78 75 71 -- 80 82  Wt. (Lbs) 152 157.12   159 159   BMI 24.53 kg/m2 26.35 kg/m2   26.66 kg/m2 26.65 kg/m2

## 2023-08-07 NOTE — Assessment & Plan Note (Signed)
 Improved, great Patient educated about the importance of limiting  Carbohydrate intake , the need to commit to daily physical activity for a minimum of 30 minutes , and to commit weight loss. The fact that changes in all these areas will reduce or eliminate all together the development of diabetes is stressed.      Latest Ref Rng & Units 08/01/2023    8:21 AM 08/01/2023    8:20 AM 03/27/2023   10:46 AM 09/26/2022   10:11 AM 05/09/2022    2:47 PM  Diabetic Labs  HbA1c 4.8 - 5.6 % 5.9   6.0  6.1    Chol 100 - 199 mg/dL  853  755  858    HDL >60 mg/dL  59  52  52    Calc LDL 0 - 99 mg/dL  78  817  81    Triglycerides 0 - 149 mg/dL  36  63  31    Creatinine 0.57 - 1.00 mg/dL  9.19  9.02  9.18  9.13       08/03/2023    2:21 PM 03/28/2023    9:54 AM 02/28/2023    2:11 PM 02/28/2023   12:37 PM 02/22/2023   11:20 AM 02/08/2023    2:03 PM 12/12/2022    6:25 PM  BP/Weight  Systolic BP 110 119 128 141 -- 138 137  Diastolic BP 67 78 75 71 -- 80 82  Wt. (Lbs) 152 157.12   159 159   BMI 24.53 kg/m2 26.35 kg/m2   26.66 kg/m2 26.65 kg/m2        No data to display

## 2023-08-07 NOTE — Progress Notes (Unsigned)
 Denise Werner     MRN: [id number]      DOB: [birth year]  Chief Complaint  Patient presents with   Hypertension    4 month follow up    Cyst    Pt complains of knots under her skin in right arm x3 months. Not painful     HPI Denise Werner is here for follow up and re-evaluation of chronic medical conditions, medication management and review of any available recent lab and radiology data.  Preventive health is updated, specifically  Cancer screening and Immunization.   Questions or concerns regarding consultations or procedures which the PT has had in the interim are  addressed. The PT denies any adverse reactions to current medications since the last visit.  Only persona;l concern is as stated above, and also wishes to get testing for neuropathy or PAD will need to clariify   ROS Denies recent fever or chills. Denies sinus pressure, nasal congestion, ear pain or sore throat. Denies chest congestion, productive cough or wheezing. Denies chest pains, palpitations and leg swelling Denies abdominal pain, nausea, vomiting,diarrhea or constipation.   Denies dysuria, frequency, hesitancy or incontinence. Intermittent low back pain, has numbness and tingling in toes and fingers, has been told she has neuropathy on home visit and wants to pursue additional testing Denies headaches, seizures, . Denies depression,  or insomnia.Anxiety over health of her spouse Denies skin break down or rash.   PE  BP 110/67   Pulse 87   Resp 18   Ht 5' 6 (1.676 m)   Wt 152 lb (68.9 kg)   SpO2 93%   BMI 24.53 kg/m   Patient alert and oriented and in no cardiopulmonary distress.  HEENT: No facial asymmetry, EOMI,     Neck supple .  Chest: Clear to auscultation bilaterally.  CVS: S1, S2 no murmurs, no S3.Regular rate.  Ext: No edema  MS: Adequate ROM spine, shoulders, hips and knees.  Skin: Intact, no ulcerations or rash noted.subcutaneous noidules x 2 right arm, pea sized  Psych: Good eye  contact, normal affect. Memory intact not anxious or depressed appearing.  CNS: CN 2-12 intact, power,  normal throughout.no focal deficits noted.   Assessment & Plan  HTN (hypertension) Controlled, no change in medication DASH diet and commitment to daily physical activity for a minimum of 30 minutes discussed and encouraged, as a part of hypertension management. The importance of attaining a healthy weight is also discussed.     08/03/2023    2:21 PM 03/28/2023    9:54 AM 02/28/2023    2:11 PM 02/28/2023   12:37 PM 02/22/2023   11:20 AM 02/08/2023    2:03 PM 12/12/2022    6:25 PM  BP/Weight  Systolic BP 110 119 128 141 -- 138 137  Diastolic BP 67 78 75 71 -- 80 82  Wt. (Lbs) 152 157.12   159 159   BMI 24.53 kg/m2 26.35 kg/m2   26.66 kg/m2 26.65 kg/m2        Hyperlipidemia LDL goal <100 Hyperlipidemia:Low fat diet discussed and encouraged.   Lipid Panel  Lab Results  Component Value Date   CHOL 146 08/01/2023   HDL 59 08/01/2023   LDLCALC 78 08/01/2023   TRIG 36 08/01/2023   CHOLHDL 2.5 08/01/2023     Controlled, no change in medication   Prediabetes Improved, great Patient educated about the importance of limiting  Carbohydrate intake , the need to commit to daily physical activity for a minimum of  30 minutes , and to commit weight loss. The fact that changes in all these areas will reduce or eliminate all together the development of diabetes is stressed.      Latest Ref Rng & Units 08/01/2023    8:21 AM 08/01/2023    8:20 AM 03/27/2023   10:46 AM 09/26/2022   10:11 AM 05/09/2022    2:47 PM  Diabetic Labs  HbA1c 4.8 - 5.6 % 5.9   6.0  6.1    Chol 100 - 199 mg/dL  853  755  858    HDL >60 mg/dL  59  52  52    Calc LDL 0 - 99 mg/dL  78  817  81    Triglycerides 0 - 149 mg/dL  36  63  31    Creatinine 0.57 - 1.00 mg/dL  9.19  9.02  9.18  9.13       08/03/2023    2:21 PM 03/28/2023    9:54 AM 02/28/2023    2:11 PM 02/28/2023   12:37 PM 02/22/2023   11:20 AM 02/08/2023     2:03 PM 12/12/2022    6:25 PM  BP/Weight  Systolic BP 110 119 128 141 -- 138 137  Diastolic BP 67 78 75 71 -- 80 82  Wt. (Lbs) 152 157.12   159 159   BMI 24.53 kg/m2 26.35 kg/m2   26.66 kg/m2 26.65 kg/m2        No data to display            Subcutaneous nodule Will observe if marked increase in size , pain , or pt concern will refer to surgery

## 2023-08-09 ENCOUNTER — Ambulatory Visit: Payer: Medicare Other | Admitting: Allergy & Immunology

## 2023-08-13 ENCOUNTER — Encounter: Payer: Self-pay | Admitting: Family Medicine

## 2023-08-13 DIAGNOSIS — R229 Localized swelling, mass and lump, unspecified: Secondary | ICD-10-CM | POA: Insufficient documentation

## 2023-08-13 DIAGNOSIS — R200 Anesthesia of skin: Secondary | ICD-10-CM

## 2023-08-13 NOTE — Assessment & Plan Note (Signed)
 Will observe if marked increase in size , pain , or pt concern will refer to surgery

## 2023-08-28 ENCOUNTER — Other Ambulatory Visit: Payer: Self-pay | Admitting: Allergy & Immunology

## 2023-08-30 ENCOUNTER — Encounter: Payer: Self-pay | Admitting: Allergy & Immunology

## 2023-08-30 ENCOUNTER — Ambulatory Visit: Admitting: Allergy & Immunology

## 2023-08-30 VITALS — BP 120/78 | HR 95 | Temp 98.2°F | Resp 16 | Ht 64.17 in | Wt 152.2 lb

## 2023-08-30 DIAGNOSIS — J302 Other seasonal allergic rhinitis: Secondary | ICD-10-CM | POA: Diagnosis not present

## 2023-08-30 DIAGNOSIS — K219 Gastro-esophageal reflux disease without esophagitis: Secondary | ICD-10-CM | POA: Diagnosis not present

## 2023-08-30 DIAGNOSIS — J4540 Moderate persistent asthma, uncomplicated: Secondary | ICD-10-CM | POA: Diagnosis not present

## 2023-08-30 DIAGNOSIS — J3089 Other allergic rhinitis: Secondary | ICD-10-CM | POA: Diagnosis not present

## 2023-08-30 MED ORDER — FAMOTIDINE 40 MG PO TABS: 40.0000 mg | ORAL_TABLET | Freq: Every day | ORAL | 1 refills | Status: DC

## 2023-08-30 NOTE — Progress Notes (Unsigned)
 FOLLOW UP  Date of Service/Encounter:  08/30/23   Assessment:   Moderate persistent asthma, uncomplicated    Chronic cough - likely related to postnasal drip and uncontrolled postnasal drip (stopping allergy shots for now and working on medication management, may consider retesting in the future)    Seasonal and perennial allergic rhinitis (trees, weeds, grasses, indoor molds, outdoor molds, dust mites, cat and cockroach) - previously on allergen immunotherapy, but stopped due to lack of improvement   Bilateral conjunctivitis with xerophthalmia - diagnosed recently with clogged tear ducts bilaterally    Acute laryngitis - adding prednisone  and cough suppressant today (deferring on antibiotic course)   Plan/Recommendations:   Allergic rhintiis - Continue with ipratropium one spray per nostril up to four times daily. - Continue with the carbinoxamine  4mg  twice daily.  - Schedule a repeat skin test.   2. Moderate persistent asthma, uncomplicated  - Lung testing looks great today. - We can increase the Trelegy to the higher dosage if you are interested.  - Daily controller medication(s): Trelegy 100/62.5/25 one puff once daily - Prior to physical activity: albuterol  2 puffs 10-15 minutes before physical activity. - Rescue medications: albuterol  4 puffs every 4-6 hours as needed - Asthma control goals:  * Full participation in all desired activities (may need albuterol  before activity) * Albuterol  use two time or less a week on average (not counting use with activity) * Cough interfering with sleep two time or less a month * Oral steroids no more than once a year * No hospitalizations  3. Reflux - Continue dietary and lifestyle modifications as listed below - Continue with pantoprazole  20 mg once a day to control reflux. - ADD on Pepcid  (famotidine ) 40mg  at night to help with the reflux.   4. Return in about 4 weeks (around 09/27/2023) for SKIN TESTING (1-55), which we cna  cancel if needed . You can have the follow up appointment with Dr. Iva or a Nurse Practicioner (our Nurse Practitioners are excellent and always have Physician oversight!).    Subjective:   Denise Werner is a 68 y.o. female presenting today for follow up of  Chief Complaint  Patient presents with   Follow-up    Coughs yellow phlegm daily in the mornings. Is using trelegy 2x a day.     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  Discussed the use of AI scribe software for clinical note transcription with the patient and/or guardian, who gave verbal consent to proceed.  Denise Werner is a 68 y.o. female presenting for a follow up visit.  She was last seen in January 2025.  At that time, we  stopped the Xhance  and started ipratropium 1 spray per nostril up to 4 times daily.  She was having a lot of postnasal drip, so we hope that this would help.  We stopped Xyzal  and started prophylactically 4 mg twice daily.  She did decide to stop allergy shots.  Her lung  testing looked great.  We continue with Trelegy 100 mcg 1 puff once daily as well as albuterol  as needed.  For her reflux, we continue with her pantoprazole  40 mg daily.  Since the last visit, she has has largely done well.  Asthma/Respiratory Symptom History: She experiences a persistent cough primarily in the mornings, accompanied by the production of yellow mucus. This has been occurring every morning for an unspecified duration. The coughing tends to dissipate as the day progresses, although occasional coughing occurs throughout the day. She does experience shortness of breath and coughing, particularly in the mornings.  Allergic Rhinitis Symptom History: She has a history of allergies to dust, molds, grasses, chocolate, and lime. She has not had a skin test in years. Previously, she used Xhance  nasal spray without significant relief but finds some benefit from using ipratropium nasal spray regularly. She also uses an allergy pill twice daily and occasionally uses Trelegy twice a day for shortness of breath and coughing, although she tries to make it last longer by spacing out doses.  GERD Symptom History: She takes pantoprazole  every morning for reflux and occasionally at night if needed. Her reflux medication is essential for her, and she adheres to taking it 30 minutes before meals. She has not found allergy shots to be particularly effective in the past.  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 She is also active in her church choir, although her coughing and vocal issues sometimes affect her singing.   Otherwise, there have been no changes to her past medical history, surgical history, family history, or social history.    Review of systems otherwise negative other than that mentioned in the HPI.    Objective:   Blood pressure 120/78, pulse 95, temperature 98.2 F (36.8 C), resp. rate 16, height 5' 4.17 (1.63 m),  weight 152 lb 4 oz (69.1 kg), SpO2 97%. Body mass index is 25.99 kg/m.    Physical Exam Vitals reviewed.  Constitutional:      Appearance: She is well-developed.     Comments: Adorable as always. Less hoarse than previously.  HENT:     Head: Normocephalic and atraumatic.     Right Ear: Tympanic membrane, ear canal and external ear normal.     Left Ear: Tympanic membrane, ear canal and external ear normal.     Nose: No nasal deformity, septal deviation, mucosal edema or rhinorrhea.     Right Turbinates: Enlarged, swollen and pale.     Left Turbinates: Enlarged, swollen and pale.     Right Sinus: No maxillary sinus tenderness or frontal sinus tenderness.     Left Sinus: No maxillary sinus tenderness or frontal sinus tenderness.     Mouth/Throat:     Mouth: Mucous membranes are not pale and not dry.     Pharynx: Uvula midline.  Eyes:     General: Lids are normal. Allergic shiner present.        Right eye: No discharge.        Left eye: No discharge.     Conjunctiva/sclera: Conjunctivae normal.     Right eye:  Right conjunctiva is not injected. No chemosis.    Left eye: Left conjunctiva is not injected. No chemosis.    Pupils: Pupils are equal, round, and reactive to light.  Cardiovascular:     Rate and Rhythm: Normal rate and regular rhythm.     Heart sounds: Normal heart sounds.  Pulmonary:     Effort: Pulmonary effort is normal. No tachypnea, accessory muscle usage or respiratory distress.     Breath sounds: Normal breath sounds. No wheezing, rhonchi or rales.     Comments: Moving air well in all lung fields. No increased work of breathing noted.  Chest:     Chest wall: No tenderness.  Lymphadenopathy:     Cervical: No cervical adenopathy.  Skin:    General: Skin is warm.     Capillary Refill: Capillary refill takes less than 2 seconds.     Coloration: Skin is not pale.     Findings: No abrasion, erythema, petechiae or rash. Rash is not papular, urticarial or vesicular.      Comments: No eczematous or urticarial lesions noted.   Neurological:     Mental Status: She is alert.  Psychiatric:        Behavior: Behavior is cooperative.      Diagnostic studies:    Spirometry: results normal (FEV1: 2.13/108%, FVC: 2.43/96%, FEV1/FVC: 88%).    Spirometry consistent with normal pattern.   Allergy Studies: none      Marty Shaggy, MD  [facility] 

## 2023-08-30 NOTE — Patient Instructions (Addendum)
 Allergic rhintiis - Continue with ipratropium one spray per nostril up to four times daily. - Continue with the carbinoxamine  4mg  twice daily.  - Schedule a repeat skin test.   2. Moderate persistent asthma, uncomplicated  - Lung testing looks great today. - We can increase the Trelegy to the higher dosage if you are interested.  - Daily controller medication(s): Trelegy 100/62.5/25 one puff once daily - Prior to physical activity: albuterol  2 puffs 10-15 minutes before physical activity. - Rescue medications: albuterol  4 puffs every 4-6 hours as needed - Asthma control goals:  * Full participation in all desired activities (may need albuterol  before activity) * Albuterol  use two time or less a week on average (not counting use with activity) * Cough interfering with sleep two time or less a month * Oral steroids no more than once a year * No hospitalizations  3. Reflux - Continue dietary and lifestyle modifications as listed below - Continue with pantoprazole  20 mg once a day to control reflux. - ADD on Pepcid  (famotidine ) 40mg  at night to help with the reflux.   4. Return in about 4 weeks (around 09/27/2023) for SKIN TESTING (1-55), which we cna cancel if needed . You can have the follow up appointment with Dr. Iva or a Nurse Practicioner (our Nurse Practitioners are excellent and always have Physician oversight!).    Please inform us  of any Emergency Department visits, hospitalizations, or changes in symptoms. Call us  before going to the ED for breathing or allergy symptoms since we might be able to fit you in for a sick visit. Feel free to contact us  anytime with any questions, problems, or concerns.  It was a pleasure to see you again today!  Websites that have reliable patient information: 1. American Academy of Asthma, Allergy, and Immunology: www.aaaai.org 2. Food Allergy Research and Education (FARE): foodallergy.org 3. Mothers of Asthmatics:  http://www.asthmacommunitynetwork.org 4. American College of Allergy, Asthma, and Immunology: www.acaai.org      "Like" us  on Facebook and Instagram for our latest updates!      A healthy democracy works best when [employer] participate! Make sure you are registered to vote! If you have moved or changed any of your contact information, you will need to get this updated before voting! Scan the QR codes below to learn more!

## 2023-09-01 ENCOUNTER — Encounter: Payer: Self-pay | Admitting: Allergy & Immunology

## 2023-09-21 ENCOUNTER — Other Ambulatory Visit: Payer: Self-pay | Admitting: Family Medicine

## 2023-09-21 MED ORDER — LIDOCAINE 5 % EX PTCH: 1.0000 | MEDICATED_PATCH | CUTANEOUS | 11 refills | Status: DC

## 2023-09-21 NOTE — Progress Notes (Signed)
 Pt c/o back and lower wextremity pain has established spondylosis, and disc disease of lumbar spine, request for lidoderm  patch received and rx sent

## 2023-09-22 ENCOUNTER — Other Ambulatory Visit (HOSPITAL_COMMUNITY): Payer: Self-pay

## 2023-09-22 ENCOUNTER — Encounter: Payer: Self-pay | Admitting: Family Medicine

## 2023-09-22 ENCOUNTER — Telehealth: Payer: Self-pay | Admitting: Pharmacy Technician

## 2023-09-22 NOTE — Telephone Encounter (Signed)
 Pharmacy Patient Advocate Encounter  Received notification from [facility] that Prior Authorization for Lidocaine  5% patches has been DENIED.  Full denial letter will be uploaded to the media tab. See denial reason below.   PA #/Case ID/Reference #: [id number]

## 2023-09-22 NOTE — Telephone Encounter (Signed)
 Pharmacy Patient Advocate Encounter   Received notification from CoverMyMeds that prior authorization for Lidocaine  5% patches is required/requested.   Insurance verification completed.   The patient is insured through [facility] .   Per test claim: PA required; PA submitted to above mentioned insurance via Latent Key/confirmation #/[id number] Status is pending

## 2023-09-27 ENCOUNTER — Ambulatory Visit: Admitting: Allergy & Immunology

## 2023-10-23 ENCOUNTER — Ambulatory Visit

## 2023-11-01 ENCOUNTER — Telehealth: Payer: Self-pay | Admitting: Allergy & Immunology

## 2023-11-01 NOTE — Telephone Encounter (Signed)
 Letter written. Katie will deliver it to the patient.   Marty Shaggy, MD [facility] 

## 2023-11-01 NOTE — Telephone Encounter (Signed)
 Patient called stating she is trying to get life insurance policy for family and it was flagged for her Trelegy inhaler. Patient is needing a letter stating that Trelegy is treating her asthma and not COPD.

## 2023-11-13 ENCOUNTER — Encounter: Payer: Self-pay | Admitting: *Deleted

## 2023-11-20 ENCOUNTER — Encounter: Payer: Self-pay | Admitting: Internal Medicine

## 2023-11-20 ENCOUNTER — Ambulatory Visit (HOSPITAL_COMMUNITY)
Admission: RE | Admit: 2023-11-20 | Discharge: 2023-11-20 | Disposition: A | Discharge status: Home / Self Care | Source: Ambulatory Visit | Attending: Internal Medicine | Admitting: Internal Medicine

## 2023-11-20 ENCOUNTER — Ambulatory Visit (INDEPENDENT_AMBULATORY_CARE_PROVIDER_SITE_OTHER): Admitting: Internal Medicine

## 2023-11-20 VITALS — BP 133/70 | HR 86 | Ht 64.0 in | Wt 160.8 lb

## 2023-11-20 DIAGNOSIS — R058 Other specified cough: Secondary | ICD-10-CM | POA: Insufficient documentation

## 2023-11-20 MED ORDER — FAMOTIDINE 20 MG PO TABS: ORAL_TABLET | ORAL | 11 refills | Status: AC

## 2023-11-20 MED ORDER — PANTOPRAZOLE SODIUM 40 MG PO TBEC: 40.0000 mg | DELAYED_RELEASE_TABLET | Freq: Every day | ORAL | 2 refills | Status: DC

## 2023-11-20 MED ORDER — BENZONATATE 200 MG PO CAPS: 200.0000 mg | ORAL_CAPSULE | Freq: Three times a day (TID) | ORAL | 1 refills | Status: AC | PRN

## 2023-11-20 NOTE — Progress Notes (Unsigned)
 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      History of Present Illness  11/20/2023  Pulmonary/ 1st office eval/ Yania Bogie /  Office BREO / ppi ac  Chief Complaint  Patient presents with   Establish Care    Coughing with yellow mucus  Dyspnea:  sometimes when supine / not so much with actvity   Cough: mod productive slt off white and sometimes yellowish -present in 1st 30 min and all day long, eases off within an hour of supine \\no  change at work in  salon vs home / no pets  Worse with signing, laughing, perfume Sleep: does not typically wake her up  SABA use: helps some  02: none     No obvious other pattern in day to day or daytime variability or assoc mucus plugs or hemoptysis or cp or chest tightness, subjective wheeze or overt sinus or hb symptoms.    Also denies any obvious fluctuation of symptoms with weather or environmental changes or other aggravating or alleviating factors except as outlined above   No unusual exposure hx or h/o childhood pna/ asthma or knowledge of premature birth.  Current Allergies, Complete Past Medical History, Past Surgical History, Family History, and Social History were reviewed in [employer] record.  ROS  The following are not active complaints unless bolded Hoarseness, sore throat/globus , dysphagia, dental problems, itching, sneezing,  nasal congestion or discharge of excess mucus or purulent secretions, ear ache,   fever, chills, sweats, unintended wt loss or wt gain, classically pleuritic or exertional cp,  orthopnea pnd or  arm/hand swelling  or leg swelling, presyncope, palpitations, abdominal pain, anorexia, nausea, vomiting, diarrhea  or change in bowel habits or change in bladder habits, change in stools or change in urine, dysuria, hematuria,  rash, arthralgias, visual complaints, headache, numbness, weakness or ataxia or problems with walking or coordination,  change in mood or  memory.            Outpatient Medications Prior to Visit  Medication Sig Dispense Refill   amLODipine  (NORVASC ) 5 MG tablet Take 1 tablet (5 mg total) by mouth daily. 90 tablet 3   Calcium  Carbonate-Vit D-Min (CALCIUM  1200) 1200-1000 MG-UNIT CHEW Chew 2 tablets by mouth daily.     Carbinoxamine  Maleate 4 MG TABS TAKE 1 TABLET (4 MG TOTAL) BY MOUTH IN THE MORNING AND AT BEDTIME 180 tablet 0   diclofenac (VOLTAREN) 50 MG EC tablet Take 50 mg by mouth 2 (two) times daily.     ipratropium (ATROVENT ) 0.03 % nasal spray Place 2 sprays into both nostrils 4 (four) times daily as needed for rhinitis. 30 mL 5   rosuvastatin  (CRESTOR ) 20 MG tablet Take 1 tablet (20 mg total) by mouth daily. 90 tablet 1   zoledronic  acid (RECLAST ) 5 MG/100ML SOLN injection Inject 5 mg into the vein.     albuterol  (VENTOLIN  HFA) 108 (90 Base) MCG/ACT inhaler Inhale 2 puffs into the lungs every 6 (six) hours as needed  for wheezing or shortness of breath. 18 g 1   famotidine  (PEPCID ) 40 MG tablet Take 1 tablet (40 mg total) by mouth daily. 90 tablet 1   Fluticasone -Umeclidin-Vilant (TRELEGY ELLIPTA ) 100-62.5-25 MCG/ACT AEPB Inhale 1 puff into the lungs daily. 28 each 5   levalbuterol  (XOPENEX  HFA) 45 MCG/ACT inhaler Inhale 2 puffs into the lungs every 6 (six) hours as needed for wheezing. 15 g 1   levalbuterol  (XOPENEX ) 1.25 MG/3ML nebulizer solution Take 1.25 mg by nebulization every 4 (four) hours as needed for wheezing. 72 mL 3   lidocaine  (LIDODERM ) 5 % Place 1 patch onto the skin daily. Remove & Discard patch within 12 hours or as directed by MD 30 patch 11    pantoprazole  (PROTONIX ) 20 MG tablet TAKE 1 TABLET BY MOUTH EVERY DAY 90 tablet 1   clobetasol (TEMOVATE) 0.05 % external solution Apply topically.     Pamabrom (DIUREX MAX) 50 MG TABS Take by mouth. Takes once daily in am     No facility-administered medications prior to visit.    Past Medical History:  Diagnosis Date   Allergy    Arthritis    Asthma    Bladder pain    Chronic neck pain    Dyslipidemia    Frequency of urination    GERD (gastroesophageal reflux disease) OCCASIONAL   H/O hiatal hernia    Hyperlipidemia    Phreesia 01/04/2020   Hypertension    Phreesia 01/04/2020   Interstitial cystitis    Nocturia    Osteoporosis    Short of breath on exertion    Sleep apnea    Urgency of urination       Objective:     BP 133/70   Pulse 86   Ht 5' 4 (1.626 m)   Wt 160 lb 12.8 oz (72.9 kg)   SpO2 98% Comment: ra  BMI 27.60 kg/m   SpO2: 98 % (ra) amb very pleasant bf nad   HEENT : Oropharynx  clear     Nasal turbinates min edema/ no pallor or polyps   NECK :  without  apparent JVD/ palpable Nodes/TM    LUNGS: no acc muscle use,  Nl contour chest which is clear to A and P bilaterally without cough on insp or exp maneuvers   CV:  RRR  no s3 or murmur or increase in P2, and no edema   ABD:  soft and nontender   MS:  Gait nl   ext warm without deformities Or obvious joint restrictions  calf tenderness, cyanosis or clubbing    SKIN: warm and dry without lesions    NEURO:  alert, approp, nl sensorium with  no motor or cerebellar deficits apparent.   CXR PA and Lateral:   11/20/2023 :    I personally reviewed images and impression is as follows:     No active dz     Assessment       Assessment & Plan Upper airway cough syndrome Onset 1995/ chronic hair salon exp  - not convinced better with allergy shots so stopped them in May 2025 - 11/20/2023 d/c BREO (try toavoid DPI's if possible in setting of UACS) , prilosec and max rx for gerd /1st gen H1  blockers per guidelines  / tessalon  200 and depomedrol 120 mg IM   Of the three most common causes of  Sub-acute / recurrent or chronic cough, only one (GERD)  can actually contribute to/ trigger  the other two (asthma and post nasal  drip syndrome)  and perpetuate the cylce of cough.  While not intuitively obvious, many patients with chronic low grade reflux do not cough until there is a primary insult that disturbs the protective epithelial barrier and exposes sensitive nerve endings.   This is typically viral but can due to PNDS or just irritants in the upper airway from salon exp  and  latter two likely apply here.     >>>The point is that once this occurs, it is difficult to eliminate the cycle  using anything but a maximally effective acid suppression regimen at least in the short run, accompanied by an appropriate diet to address non acid GERD and control / eliminate the cough itself for at least 3 days with tessalon  200, block all pnds with 1st gen H1 blockers per guidelines  and depomedrol 120 mg IM  in case of component of Th-2 driven upper or lower airways inflammation (if cough responds short term only to relapse before return while will on full rx for uacs (as above), then that would point to allergic rhinitis/ asthma or eos bronchitis as alternative dx)    Discussed in detail all the  indications, usual  risks and alternatives  relative to the benefits with patient who agrees to proceed with Rx as outlined.             Each maintenance medication was reviewed in detail including emphasizing most importantly the difference between maintenance and prns and under what circumstances the prns are to be triggered using an action plan format where appropriate.  Total time for H and P, chart review, counseling, reviewing DPI  device(s) and generating customized AVS unique to this office visit / same day charting = 48 min new pt eval  for  respiratory  symptoms of uncertain etiology           AVS  Patient Instructions  Pantoprazole  (protonix ) 40 mg   Take  30-60 min before first meal of the day and Pepcid  (famotidine )  20 mg after supper until return to office - this is the best way to tell whether stomach acid is contributing to your problem.   For drainage / throat tickle try take CHLORPHENIRAMINE  4 mg  (Allergy Relief 4mg   at [facility] should be easiest to find in the blue box usually on bottom shelf)  take one every 4 hours as needed - extremely effective and inexpensive over the counter- may cause drowsiness so start with just a dose or two an hour before bedtime and see how you tolerate it before trying in daytime.   Depomedrol 120 mg   Take delsym two tsp every 12 hours and supplement if needed with  tessalon  200 mg  every 4-6 hours  the urge to cough. Swallowing water  and/or using ice chips/non mint and menthol containing candies (such as lifesavers or sugarless jolly ranchers) are also effective.  You should rest your voice and avoid activities that you know make you cough.  Once you have eliminated the cough for 3 straight days try off tessalon    then the delsym as tolerated.    GERD (REFLUX)  is an extremely common cause of respiratory symptoms just like yours , many times with no obvious heartburn at all.    It can be treated with medication, but also with lifestyle changes including elevation of the head of your bed (ideally with 6 -8inch blocks under the headboard of your bed),  Smoking cessation, avoidance of late meals, excessive alcohol,  and avoid fatty foods, chocolate, peppermint, colas, red wine, and acidic juices such as orange juice.  NO MINT OR MENTHOL PRODUCTS SO NO COUGH DROPS (LUDEN's pectin) USE SUGARLESS CANDY INSTEAD (Jolley ranchers or Stover's or Environmental manager) or even ice chips will also do - the key is to swallow to prevent all throat clearing. NO OIL BASED VITAMINS - use powdered substitutes.  Avoid fish oil when coughing.  Please  remember to go to the  x-ray department  @  [facility] for your tests - we will call you with the results when they are available     Followup is as needed           Ozell America, MD 11/21/2023

## 2023-11-20 NOTE — Patient Instructions (Addendum)
 Pantoprazole  (protonix ) 40 mg   Take  30-60 min before first meal of the day and Pepcid  (famotidine )  20 mg after supper until return to office - this is the best way to tell whether stomach acid is contributing to your problem.   For drainage / throat tickle try take CHLORPHENIRAMINE  4 mg  (Allergy Relief 4mg   at [facility] should be easiest to find in the blue box usually on bottom shelf)  take one every 4 hours as needed - extremely effective and inexpensive over the counter- may cause drowsiness so start with just a dose or two an hour before bedtime and see how you tolerate it before trying in daytime.   Depomedrol 120 mg   Take delsym two tsp every 12 hours and supplement if needed with  tessalon  200 mg  every 4-6 hours  the urge to cough. Swallowing water  and/or using ice chips/non mint and menthol containing candies (such as lifesavers or sugarless jolly ranchers) are also effective.  You should rest your voice and avoid activities that you know make you cough.  Once you have eliminated the cough for 3 straight days try off tessalon    then the delsym as tolerated.    GERD (REFLUX)  is an extremely common cause of respiratory symptoms just like yours , many times with no obvious heartburn at all.    It can be treated with medication, but also with lifestyle changes including elevation of the head of your bed (ideally with 6 -8inch blocks under the headboard of your bed),  Smoking cessation, avoidance of late meals, excessive alcohol, and avoid fatty foods, chocolate, peppermint, colas, red wine, and acidic juices such as orange juice.  NO MINT OR MENTHOL PRODUCTS SO NO COUGH DROPS (LUDEN's pectin) USE SUGARLESS CANDY INSTEAD (Jolley ranchers or Stover's or Environmental manager) or even ice chips will also do - the key is to swallow to prevent all throat clearing. NO OIL BASED VITAMINS - use powdered substitutes.  Avoid fish oil when coughing.  Please remember to go to the  x-ray department   @  [facility] for your tests - we will call you with the results when they are available     Followup is as needed

## 2023-11-21 DIAGNOSIS — R058 Other specified cough: Secondary | ICD-10-CM | POA: Diagnosis not present

## 2023-11-21 MED ORDER — METHYLPREDNISOLONE ACETATE 80 MG/ML IJ SUSP
120.0000 mg | Freq: Once | INTRAMUSCULAR | Status: AC
  Administered 2023-11-21: 120 mg via INTRAMUSCULAR

## 2023-11-21 NOTE — Assessment & Plan Note (Addendum)
 Onset 1995/ chronic hair salon exp  - not convinced better with allergy shots so stopped them in May 2025 - 11/20/2023 d/c BREO (try toavoid DPI's if possible in setting of UACS) , prilosec and max rx for gerd /1st gen H1 blockers per guidelines  / tessalon  200 and depomedrol 120 mg IM   Of the three most common causes of  Sub-acute / recurrent or chronic cough, only one (GERD)  can actually contribute to/ trigger  the other two (asthma and post nasal drip syndrome)  and perpetuate the cylce of cough.  While not intuitively obvious, many patients with chronic low grade reflux do not cough until there is a primary insult that disturbs the protective epithelial barrier and exposes sensitive nerve endings.   This is typically viral but can due to PNDS or just irritants in the upper airway from salon exp  and  latter two likely apply here.     >>>The point is that once this occurs, it is difficult to eliminate the cycle  using anything but a maximally effective acid suppression regimen at least in the short run, accompanied by an appropriate diet to address non acid GERD and control / eliminate the cough itself for at least 3 days with tessalon  200, block all pnds with 1st gen H1 blockers per guidelines  and depomedrol 120 mg IM  in case of component of Th-2 driven upper or lower airways inflammation (if cough responds short term only to relapse before return while will on full rx for uacs (as above), then that would point to allergic rhinitis/ asthma or eos bronchitis as alternative dx)    Discussed in detail all the  indications, usual  risks and alternatives  relative to the benefits with patient who agrees to proceed with Rx as outlined.             Each maintenance medication was reviewed in detail including emphasizing most importantly the difference between maintenance and prns and under what circumstances the prns are to be triggered using an action plan format where appropriate.  Total time for  H and P, chart review, counseling, reviewing DPI  device(s) and generating customized AVS unique to this office visit / same day charting = 48 min new pt eval  for  respiratory  symptoms of uncertain etiology

## 2023-11-21 NOTE — Addendum Note (Signed)
 Addended by: RUFFUS LUKES T on: 11/21/2023 08:05 AM   Modules accepted: Orders

## 2023-11-22 ENCOUNTER — Ambulatory Visit: Payer: Self-pay | Admitting: Internal Medicine

## 2023-11-22 NOTE — Progress Notes (Signed)
 Called to inform pt of her cxr - pt confirmed understanding

## 2023-11-29 NOTE — Telephone Encounter (Signed)
 We received a fax from her insurance company. They did not like my previous letter. So I rehashed the same idea using different verbiage.   Marty Shaggy, MD [facility] 

## 2023-12-12 ENCOUNTER — Telehealth: Payer: Self-pay | Admitting: Diagnostic Neuroimaging

## 2023-12-12 NOTE — Telephone Encounter (Signed)
Patient cancelled appointment due to scheduling conflict.

## 2023-12-19 ENCOUNTER — Ambulatory Visit: Admitting: Diagnostic Neuroimaging

## 2024-01-10 ENCOUNTER — Ambulatory Visit: Attending: Student | Admitting: Student

## 2024-01-10 ENCOUNTER — Encounter: Payer: Self-pay | Admitting: Student

## 2024-01-10 VITALS — BP 142/80 | HR 102 | Ht 66.0 in | Wt 159.0 lb

## 2024-01-10 DIAGNOSIS — R000 Tachycardia, unspecified: Secondary | ICD-10-CM

## 2024-01-10 DIAGNOSIS — I10 Essential (primary) hypertension: Secondary | ICD-10-CM

## 2024-01-10 DIAGNOSIS — E785 Hyperlipidemia, unspecified: Secondary | ICD-10-CM

## 2024-01-10 DIAGNOSIS — Z87898 Personal history of other specified conditions: Secondary | ICD-10-CM

## 2024-01-10 NOTE — Progress Notes (Signed)
 Cardiology Office Note    Date:  01/10/2024  ID:  Denise Werner, Denise Werner 1955/07/12, MRN [id number] Cardiologist: Vina Gull, MD { :  History of Present Illness:    Denise Werner is a 68 y.o. female with past medical history of HTN, HLD, asthma and atypical chest pain (Coronary CTA in 03/2020 showed a coronary calcium  score of 0 with no evidence of CAD) who presents to the office today for annual follow-up.  She was examined by myself in 08/2022 and was performing home workouts and denied any recent anginal symptoms. She did report having occasional lower extremity edema but this was isolated to her ankles. Was not on diuretic therapy at the time. She was continued on Amlodipine  5 mg daily and Crestor  10 mg daily.  In talking with the patient today, she reports having baseline dyspnea on exertion which is sporadic and has overall been stable for the past few years. She feels like symptoms are likely due to asthma. She does walk for 30 to 45 minutes around her neighborhood most days of the week and denies any anginal symptoms with this. No recent palpitations, orthopnea, PND or pitting edema. She has been under increased stress as her husband is undergoing workup for progressive memory loss.   Studies Reviewed:   EKG: EKG is ordered today and demonstrates:   EKG Interpretation Date/Time:  Wednesday January 10 2024 13:26:00 EST Ventricular Rate:  110 PR Interval:  164 QRS Duration:  74 QT Interval:  332 QTC Calculation: 449 R Axis:   68  Text Interpretation: Sinus tachycardia Nonspecific ST and T wave abnormality When compared with ECG of 17-Aug-2022 15:06, No significant change was found Confirmed by Johnson Grate ([id number]) on 01/10/2024 1:35:22 PM       Echocardiogram: 11/2019 IMPRESSIONS     1. Left ventricular ejection fraction, by estimation, is 65 to 70%. The  left ventricle has normal function. The left ventricle has no regional  wall motion abnormalities. Left  ventricular diastolic parameters were  normal.   2. Right ventricular systolic function is normal. The right ventricular  size is normal. Tricuspid regurgitation signal is inadequate for assessing  PA pressure.   3. The mitral valve is grossly normal. Trivial mitral valve  regurgitation.   4. The aortic valve is tricuspid. Aortic valve regurgitation is not  visualized.   5. The inferior vena cava is normal in size with greater than 50%  respiratory variability, suggesting right atrial pressure of 3 mmHg.   Coronary CTA: 03/2020 IMPRESSION: 1. Coronary calcium  score of 0.   2. Normal coronary origin with right dominance.   3. Mild cardiac motion artifact, but the coronary arteries appear normal.   RECOMMENDATIONS: 1. No evidence of CAD (0%). Consider non-atherosclerotic causes of chest pain.  Risk Assessment/Calculations:     HYPERTENSION CONTROL Vitals:   01/10/24 1327 01/10/24 1349  BP: (!) 152/82 (!) 142/80    The patient's blood pressure is elevated above target today. In order to address the patient's elevated BP: Blood pressure will be monitored at home to determine if medication changes need to be made.   Physical Exam:   VS:  BP (!) 142/80 Comment: recheck  Pulse (!) 102   Ht 5' 6 (1.676 m)   Wt 159 lb (72.1 kg)   SpO2 96%   BMI 25.66 kg/m    Wt Readings from Last 3 Encounters:  01/10/24 159 lb (72.1 kg)  11/20/23 160 lb 12.8 oz (72.9 kg)  08/30/23 152  lb 4 oz (69.1 kg)     GEN: Pleasant female appearing in no acute distress NECK: No JVD; No carotid bruits CARDIAC: RRR, no murmurs, rubs, gallops RESPIRATORY:  Clear to auscultation without rales, wheezing or rhonchi  ABDOMEN: Appears non-distended. No obvious abdominal masses. EXTREMITIES: No clubbing or cyanosis. No pitting edema.  Distal pedal pulses are 2+ bilaterally.   Assessment and Plan:   1. History of Chest Pain - Prior Coronary CTA in 03/2020 showed a coronary calcium  score of 0 and no  evidence of CAD. She has baseline dyspnea on exertion but no acute change in symptoms and no chest pain. Continue with risk factor modification. Remains on Crestor  20 mg daily.  2. Essential hypertension - BP was initially elevated at 152/82, rechecked and at 142/80 during today's visit. I encouraged her to follow this in the ambulatory setting. Continue current medical therapy with Amlodipine  5mg  daily. If BP remains above goal, would further titrate Amlodipine  to 10mg  daily.  3. Hyperlipidemia LDL goal <100 - Her LDL was previously at 182 in 03/2023 but had significantly improved to 78 by recheck in 08/2023. LFT's WNL. Continue current medical therapy with Crestor  20mg  daily.   4. Tachycardia - Heart rate was initially in the 110's and improved to 102 bpm on recheck. Consistent with sinus tachycardia by her EKG. She denies any associated symptoms. Encouraged her to follow this on her iWatch and make us  aware if HR remains consistently elevated as we could place a Zio patch. She only consumes less than 1/2 cup of coffee a day and no other caffeine intake.  Signed, Laymon CHRISTELLA Qua, PA-C

## 2024-01-10 NOTE — Patient Instructions (Signed)
 Medication Instructions:  Your physician recommends that you continue on your current medications as directed. Please refer to the Current Medication list given to you today.  *If you need a refill on your cardiac medications before your next appointment, please call your pharmacy*   Follow-Up: At [facility], you and your health needs are our priority.  As part of our continuing mission to provide you with exceptional heart care, our providers are all part of one team.  This team includes your primary Cardiologist (physician) and Advanced Practice Providers or APPs (Physician Assistants and Nurse Practitioners) who all work together to provide you with the care you need, when you need it.  Your next appointment:   1 year(s)  Provider:   Vina Gull, MD or Laymon Qua, PA-C

## 2024-01-18 ENCOUNTER — Ambulatory Visit: Admitting: Internal Medicine

## 2024-02-15 ENCOUNTER — Other Ambulatory Visit: Payer: Self-pay | Admitting: Internal Medicine

## 2024-02-15 DIAGNOSIS — R058 Other specified cough: Secondary | ICD-10-CM

## 2024-02-27 ENCOUNTER — Ambulatory Visit: Payer: Medicare Other

## 2024-02-27 VITALS — Ht 66.0 in | Wt 159.0 lb

## 2024-02-27 DIAGNOSIS — Z1231 Encounter for screening mammogram for malignant neoplasm of breast: Secondary | ICD-10-CM

## 2024-02-27 DIAGNOSIS — Z0000 Encounter for general adult medical examination without abnormal findings: Secondary | ICD-10-CM

## 2024-02-27 NOTE — Patient Instructions (Signed)
 Denise Werner,  Thank you for taking the time for your Medicare Wellness Visit. I appreciate your continued commitment to your health goals. Please review the care plan we discussed, and feel free to reach out if I can assist you further.  Please note that Annual Wellness Visits do not include a physical exam. Some assessments may be limited, especially if the visit was conducted virtually. If needed, we may recommend an in-person follow-up with your provider.  Ongoing Care Seeing your primary care provider every 3 to 6 months helps us  monitor your health and provide consistent, personalized care.   Aim for 30 minutes of exercise or brisk walking, 6-8 glasses of water , and 5 servings of fruits and vegetables each day.  Referrals If a referral was made during today's visit and you haven't received any updates within two weeks, please contact the referred provider directly to check on the status.  Mammogram Order: Please call and make your appt or schedule it through my chart [facility] [address] [place], [place] [address] [phone]  Recommended Screenings:  Health Maintenance  Topic Date Due   COVID-19 Vaccine (8 - 2025-26 season) 10/02/2023   DTaP/Tdap/Td vaccine (3 - Td or Tdap) 10/03/2023   Medicare Annual Wellness Visit  02/22/2024   Breast Cancer Screening  04/18/2024   Osteoporosis screening with Bone Density Scan  04/03/2025   Colon Cancer Screening  04/10/2025   Pneumococcal Vaccine for age over 29  Completed   Flu Shot  Completed   Hepatitis C Screening  Completed   Zoster (Shingles) Vaccine  Completed   Meningitis B Vaccine  Aged Out       02/26/2024    4:05 PM  Advanced Directives  Does Patient Have a Medical Advance Directive? No  Would patient like information on creating a medical advance directive? Yes (MAU/Ambulatory/Procedural Areas - Information given)    Vision: Annual vision screenings are recommended for early detection of  glaucoma, cataracts, and diabetic retinopathy. These exams can also reveal signs of chronic conditions such as diabetes and high blood pressure.  Dental: Annual dental screenings help detect early signs of oral cancer, gum disease, and other conditions linked to overall health, including heart disease and diabetes.  Please see the attached documents for additional preventive care recommendations.

## 2024-02-27 NOTE — Progress Notes (Signed)
 "  Mammogram order placed and patient aware to call and schedule.  Chief Complaint  Patient presents with   Medicare Wellness     Subjective:   Denise Werner is a 69 y.o. female who presents for a Medicare Annual Wellness Visit.  Visit info / Clinical Intake: Medicare Wellness Visit Type:: Subsequent Annual Wellness Visit Persons participating in visit and providing information:: patient Medicare Wellness Visit Mode:: Video Since this visit was completed virtually, some vitals may be partially provided or unavailable. Missing vitals are due to the limitations of the virtual format.: Documented vitals are patient reported If Telephone or Video please confirm:: I connected with patient using audio/video enable telemedicine. I verified patient identity with two identifiers, discussed telehealth limitations, and patient agreed to proceed. Patient Location:: home Provider Location:: home office Interpreter Needed?: No Pre-visit prep was completed: yes AWV questionnaire completed by patient prior to visit?: yes Date:: 02/26/24 Living arrangements:: (Patient-Rptd) lives with spouse/significant other Patient's Overall Health Status Rating: (Patient-Rptd) good Typical amount of pain: (Patient-Rptd) some Does pain affect daily life?: (Patient-Rptd) no Are you currently prescribed opioids?: no  Dietary Habits and Nutritional Risks How many meals a day?: (Patient-Rptd) 2 Eats fruit and vegetables daily?: (Patient-Rptd) yes Most meals are obtained by: (Patient-Rptd) preparing own meals In the last 2 weeks, have you had any of the following?: none Diabetic:: no  Functional Status Activities of Daily Living (to include ambulation/medication): (Patient-Rptd) Independent Ambulation: (Patient-Rptd) Independent Medication Administration: (Patient-Rptd) Independent Home Management (perform basic housework or laundry): (Patient-Rptd) Independent Manage your own finances?: (Patient-Rptd)  yes Primary transportation is: (Patient-Rptd) driving Concerns about vision?: no *vision screening is required for WTM* Concerns about hearing?: no  Fall Screening Falls in the past year?: (Patient-Rptd) 0 Number of falls in past year: 0 Was there an injury with Fall?: 0 Fall Risk Category Calculator: 0 Patient Fall Risk Level: Low Fall Risk  Fall Risk Patient at Risk for Falls Due to: No Fall Risks Fall risk Follow up: Falls evaluation completed; Education provided; Falls prevention discussed  Home and Transportation Safety: All rugs have non-skid backing?: (Patient-Rptd) yes All stairs or steps have railings?: (Patient-Rptd) yes Grab bars in the bathtub or shower?: (!) (Patient-Rptd) no Have non-skid surface in bathtub or shower?: (!) (Patient-Rptd) no Good home lighting?: (Patient-Rptd) yes Regular seat belt use?: (Patient-Rptd) yes Hospital stays in the last year:: (Patient-Rptd) no  Cognitive Assessment Difficulty concentrating, remembering, or making decisions? : (Patient-Rptd) no Will 6CIT or Mini Cog be Completed: yes What year is it?: 0 points What month is it?: 0 points Give patient an address phrase to remember (5 components): [address] [place] About what time is it?: 0 points Count backwards from 20 to 1: 0 points Say the months of the year in reverse: 0 points Repeat the address phrase from earlier: 0 points 6 CIT Score: 0 points  Advance Directives (For Healthcare) Does Patient Have a Medical Advance Directive?: No Would patient like information on creating a medical advance directive?: Yes (MAU/Ambulatory/Procedural Areas - Information given)  Reviewed/Updated  Reviewed/Updated: Reviewed All (Medical, Surgical, Family, Medications, Allergies, Care Teams, Patient Goals)    Allergies (verified) Latex, Hydrocodone , Other, and Sulfonamide derivatives   Current Medications (verified) Outpatient Encounter Medications as of 02/27/2024  Medication Sig    amLODipine  (NORVASC ) 5 MG tablet Take 1 tablet (5 mg total) by mouth daily.   benzonatate  (TESSALON ) 200 MG capsule Take 1 capsule (200 mg total) by mouth 3 (three) times daily as needed  for cough.   Calcium  Carbonate-Vit D-Min (CALCIUM  1200) 1200-1000 MG-UNIT CHEW Chew 2 tablets by mouth daily.   Carbinoxamine  Maleate 4 MG TABS TAKE 1 TABLET (4 MG TOTAL) BY MOUTH IN THE MORNING AND AT BEDTIME   diclofenac (VOLTAREN) 50 MG EC tablet Take 50 mg by mouth 2 (two) times daily.   famotidine  (PEPCID ) 20 MG tablet One after supper   ipratropium (ATROVENT ) 0.03 % nasal spray Place 2 sprays into both nostrils 4 (four) times daily as needed for rhinitis.   pantoprazole  (PROTONIX ) 40 MG tablet TAKE 1 TABLET (40 MG TOTAL) BY MOUTH DAILY. TAKE 30-60 MIN BEFORE FIRST MEAL OF THE DAY   rosuvastatin  (CRESTOR ) 20 MG tablet Take 1 tablet (20 mg total) by mouth daily.   zoledronic  acid (RECLAST ) 5 MG/100ML SOLN injection Inject 5 mg into the vein.   No facility-administered encounter medications on file as of 02/27/2024.    History: Past Medical History:  Diagnosis Date   Allergy    Arthritis    Asthma    Bladder pain    Chronic neck pain    Dyslipidemia    Frequency of urination    GERD (gastroesophageal reflux disease) OCCASIONAL   H/O hiatal hernia    Hyperlipidemia    Phreesia 01/04/2020   Hypertension    Phreesia 01/04/2020   Interstitial cystitis    Nocturia    Osteoporosis    Short of breath on exertion    Sleep apnea    Urgency of urination    Past Surgical History:  Procedure Laterality Date   ABDOMINAL HYSTERECTOMY     W/ BILATERAL SALPINGOOPHECTOMY   APPENDECTOMY     BIOPSY  04/11/2018   Procedure: BIOPSY;  Surgeon: Golda Claudis PENNER, MD;  Location: [facility];  Service: Endoscopy;;  colon polyp with biopsy forceps   BUNIONECTOMY  04/14/2003   LEFT FOOT  W/ POST REMOVAL INTERNAL FIXATION DEVICE  VIA SILVER BUNIONECTOMY   CHOLECYSTECTOMY  02/01/1996   COLONOSCOPY N/A  04/11/2018   Procedure: COLONOSCOPY;  Surgeon: Golda Claudis PENNER, MD;  Location: [facility];  Service: Endoscopy;  Laterality: N/A;  730   CYSTO WITH HYDRODISTENSION  09/12/2011   Procedure: CYSTOSCOPY/HYDRODISTENSION;  Surgeon: Arlena LILLETTE Gal, MD;  Location: [facility];  Service: Urology;  Laterality: N/A;  [facility] M & P AND M & P   CYSTO/ HOD/ INSTILLATION THERAPY  11-17-2008;   12-02-1999   INTERSTITIAL CYSTITIS   ESOPHAGOGASTRODUODENOSCOPY N/A 05/01/2013   Procedure: ESOPHAGOGASTRODUODENOSCOPY (EGD);  Surgeon: Claudis PENNER Golda, MD;  Location: [facility];  Service: Endoscopy;  Laterality: N/A;  100   FINGER SURGERY  10/13/2009   left 5th finger ARTHRODESIS   FOOT ARTHROPLASTY  05/03/2004   FIFTHE TOE RIGHT FOOT   KNEE ARTHROSCOPY  02/01/2011   LEFT KNEE   Family History  Problem Relation Age of Onset   Cancer Sister    Cancer Brother    Lupus Sister    Hypertension Sister    Aneurysm Sister        x4   Social History   Occupational History   Not on file  Tobacco Use   Smoking status: Never   Smokeless tobacco: Never  Vaping Use   Vaping status: Never Used  Substance and Sexual Activity   Alcohol use: Never   Drug use: Never   Sexual activity: Yes    Birth control/protection: Surgical   Tobacco Counseling Counseling given: Yes  SDOH Screenings   Food Insecurity:  No Food Insecurity (02/26/2024)  Housing: Low Risk (02/26/2024)  Transportation Needs: No Transportation Needs (02/26/2024)  Utilities: Not At Risk (02/27/2024)  Alcohol Screen: Low Risk (02/22/2023)  Depression (PHQ2-9): Low Risk (02/27/2024)  Financial Resource Strain: Low Risk (02/26/2024)  Physical Activity: Insufficiently Active (02/26/2024)  Social Connections: Moderately Isolated (02/26/2024)  Stress: No Stress Concern Present (02/26/2024)  Tobacco Use: Low Risk (02/27/2024)  Health Literacy: Adequate Health Literacy (02/27/2024)   See flowsheets for full screening  details  Depression Screen PHQ 2 & 9 Depression Scale- Over the past 2 weeks, how often have you been bothered by any of the following problems? Little interest or pleasure in doing things: 0 Feeling down, depressed, or hopeless (PHQ Adolescent also includes...irritable): 0 PHQ-2 Total Score: 0 Trouble falling or staying asleep, or sleeping too much: 0 Feeling tired or having little energy: 0 Poor appetite or overeating (PHQ Adolescent also includes...weight loss): 0 Feeling bad about yourself - or that you are a failure or have let yourself or your family down: 0 Trouble concentrating on things, such as reading the newspaper or watching television (PHQ Adolescent also includes...like school work): 0 Moving or speaking so slowly that other people could have noticed. Or the opposite - being so fidgety or restless that you have been moving around a lot more than usual: 0 Thoughts that you would be better off dead, or of hurting yourself in some way: 0 PHQ-9 Total Score: 0  Depression Treatment Depression Interventions/Treatment : PHQ2-9 Score <4 Follow-up Not Indicated     Goals Addressed             This Visit's Progress    Increase activity   On track    Get back to the gym             Objective:    Today's Vitals   02/27/24 1432  Weight: 159 lb (72.1 kg)  Height: 5' 6 (1.676 m)   Body mass index is 25.66 kg/m.  Hearing/Vision screen Hearing Screening - Comments:: Patient denies any hearing difficulties.   Vision Screening - Comments:: Patient is up to date on yearly eye exams with Oneil Kawasaki  Immunizations and Health Maintenance Health Maintenance  Topic Date Due   COVID-19 Vaccine (8 - 2025-26 season) 10/02/2023   DTaP/Tdap/Td (3 - Td or Tdap) 10/03/2023   Medicare Annual Wellness (AWV)  02/22/2024   Mammogram  04/18/2024   Bone Density Scan  04/03/2025   Colonoscopy  04/10/2025   Pneumococcal Vaccine: 50+ Years  Completed   Influenza Vaccine   Completed   Hepatitis C Screening  Completed   Zoster Vaccines- Shingrix   Completed   Meningococcal B Vaccine  Aged Out        Assessment/Plan:  This is a routine wellness examination for Denise Werner.  Patient Care Team: Antonetta Rollene BRAVO, MD as PCP - General (Family Medicine) Okey Vina GAILS, MD as Attending Physician (Cardiology) Kawasaki Oneil, DO (Optometry) Iva Marty Saltness, MD as Consulting Physician (Allergy and Immunology) Orpha Asberry RAMAN, MD as Consulting Physician (Family Medicine) Tess Agent, MD as Referring Physician (Sports Medicine)  I have personally reviewed and noted the following in the patients chart:   Medical and social history Use of alcohol, tobacco or illicit drugs  Current medications and supplements including opioid prescriptions. Functional ability and status Nutritional status Physical activity Advanced directives List of other physicians Hospitalizations, surgeries, and ER visits in previous 12 months Vitals Screenings to include cognitive, depression, and falls Referrals and appointments  Orders Placed This Encounter  Procedures   MM 3D SCREENING MAMMOGRAM BILATERAL BREAST    Standing Status:   Future    Expiration Date:   02/26/2025    Reason for Exam (SYMPTOM  OR DIAGNOSIS REQUIRED):   breast cancer screening    Preferred imaging location?:   [facility]   In addition, I have reviewed and discussed with patient certain preventive protocols, quality metrics, and best practice recommendations. A written personalized care plan for preventive services as well as general preventive health recommendations were provided to patient.   Derrica Sieg, CMA   02/27/2024   Return February 28, 2025 at 1:50 pm, for In office Medicare Well Visit w  Wellness Nurse.  After Visit Summary: (MyChart) Due to this being a telephonic visit, the after visit summary with patients personalized plan was offered to patient via MyChart    "

## 2024-04-02 ENCOUNTER — Encounter: Admitting: Family Medicine

## 2024-04-22 ENCOUNTER — Ambulatory Visit

## 2025-02-28 ENCOUNTER — Ambulatory Visit: Payer: Self-pay
